# Patient Record
Sex: Female | Born: 1980
Health system: Southern US, Community
[De-identification: ages and names within clinical notes are randomized; demographics above are authoritative.]

## PROBLEM LIST (undated history)

## (undated) DIAGNOSIS — F419 Anxiety disorder, unspecified: Secondary | ICD-10-CM

## (undated) DIAGNOSIS — F32A Depression, unspecified: Secondary | ICD-10-CM

## (undated) DIAGNOSIS — E669 Obesity, unspecified: Secondary | ICD-10-CM

## (undated) DIAGNOSIS — D649 Anemia, unspecified: Secondary | ICD-10-CM

## (undated) DIAGNOSIS — G4733 Obstructive sleep apnea (adult) (pediatric): Secondary | ICD-10-CM

## (undated) DIAGNOSIS — F329 Major depressive disorder, single episode, unspecified: Secondary | ICD-10-CM

## (undated) DIAGNOSIS — K219 Gastro-esophageal reflux disease without esophagitis: Secondary | ICD-10-CM

## (undated) DIAGNOSIS — Q433 Congenital malformations of intestinal fixation: Secondary | ICD-10-CM

## (undated) DIAGNOSIS — R519 Headache, unspecified: Secondary | ICD-10-CM

## (undated) DIAGNOSIS — R51 Headache: Secondary | ICD-10-CM

## (undated) HISTORY — DX: Obstructive sleep apnea (adult) (pediatric): G47.33

## (undated) HISTORY — DX: Major depressive disorder, single episode, unspecified: F32.9

## (undated) HISTORY — DX: Congenital malformations of intestinal fixation: Q43.3

## (undated) HISTORY — DX: Anxiety disorder, unspecified: F41.9

## (undated) HISTORY — DX: Depression, unspecified: F32.A

## (undated) HISTORY — DX: Obesity, unspecified: E66.9

## (undated) HISTORY — PX: TUBAL LIGATION: SHX77

## (undated) HISTORY — PX: SLEEVE GASTROPLASTY: SHX1101

---

## 2002-12-18 HISTORY — PX: BREAST REDUCTION SURGERY: SHX8

## 2003-05-26 HISTORY — PX: NASAL SINUS SURGERY: SHX719

## 2004-03-31 ENCOUNTER — Ambulatory Visit: Payer: Self-pay | Admitting: Otolaryngology

## 2004-05-25 ENCOUNTER — Ambulatory Visit: Payer: Self-pay | Admitting: Otolaryngology

## 2004-06-03 ENCOUNTER — Emergency Department: Payer: Self-pay | Admitting: Otolaryngology

## 2006-04-12 ENCOUNTER — Other Ambulatory Visit: Admission: RE | Admit: 2006-04-12 | Discharge: 2006-04-12 | Payer: Self-pay | Admitting: Obstetrics and Gynecology

## 2006-08-17 ENCOUNTER — Emergency Department: Payer: Self-pay | Admitting: Emergency Medicine

## 2007-01-13 ENCOUNTER — Observation Stay: Payer: Self-pay

## 2007-02-22 ENCOUNTER — Observation Stay: Payer: Self-pay

## 2007-02-28 ENCOUNTER — Ambulatory Visit: Payer: Self-pay | Admitting: Unknown Physician Specialty

## 2007-04-02 ENCOUNTER — Emergency Department: Payer: Self-pay | Admitting: Emergency Medicine

## 2007-04-14 ENCOUNTER — Inpatient Hospital Stay: Payer: Self-pay | Admitting: Unknown Physician Specialty

## 2007-05-04 ENCOUNTER — Emergency Department: Payer: Self-pay | Admitting: Emergency Medicine

## 2011-01-04 ENCOUNTER — Ambulatory Visit (INDEPENDENT_AMBULATORY_CARE_PROVIDER_SITE_OTHER): Payer: Self-pay | Admitting: General Surgery

## 2011-01-05 ENCOUNTER — Encounter (INDEPENDENT_AMBULATORY_CARE_PROVIDER_SITE_OTHER): Payer: Self-pay | Admitting: General Surgery

## 2011-01-05 ENCOUNTER — Ambulatory Visit (INDEPENDENT_AMBULATORY_CARE_PROVIDER_SITE_OTHER): Payer: 59 | Admitting: General Surgery

## 2011-01-05 VITALS — BP 120/82 | HR 84 | Ht 61.5 in | Wt 215.0 lb

## 2011-01-05 DIAGNOSIS — Q433 Congenital malformations of intestinal fixation: Secondary | ICD-10-CM

## 2011-01-05 DIAGNOSIS — K8020 Calculus of gallbladder without cholecystitis without obstruction: Secondary | ICD-10-CM

## 2011-01-05 NOTE — Patient Instructions (Signed)
Cholelithiasis, Gallbladder Disease (Gallstones) Gallstones are a form of gallbladder disease. When there is an infection of the gallbladder it is called cholecystitis. It is usually caused by a build-up of stones (gallstones or cholelithiasis) in your gallbladder. The gallbladder is not an essential organ. This means it is not necessary for life. It is located slightly to the right of center in the belly (abdomen), behind the liver. It stores bile made in the liver. Bile aids in digestion and absorption of fats. Gallbladder disease may result in feeling sick to your stomach (nausea), abdominal pain, and jaundice. In severe cases, emergency surgery may be required. Gallstones are the most common type of gallbladder disease. They begin as small crystals and slowly grow into stones. Gallstone pain occurs when the gallbladder spasms and a gallstone is blocking the duct. Pain can also occur when a stone passes out of the duct. The pain usually begins suddenly. It may persist from several minutes to several hours. Infection can occur. Infection can add to discomfort and severity of an acute attack. The pain may be made worse by breathing deeply or by being jarred. There may be fever and tenderness to the touch. In some cases, when gallstones do not move into the bile duct, people have no pain or symptoms. These are called "silent" gallstones. Women are three times more likely to develop gallstones than men. Women who have had several pregnancies are more likely to have gallbladder disease. Physicians sometimes advise removing diseased gallbladders before future pregnancies. Other factors that increase the risk of gallbladder disease are obesity, diets heavy in fried foods and dairy products, increasing age, prolonged use of medications containing female hormones, and heredity. HOME CARE INSTRUCTIONS  Only take over-the-counter or prescription medicines for pain, discomfort, or fever as directed by your caregiver.     Follow a low fat diet until seen again. (Fat causes the gallbladder to contract.)   Follow-up as instructed. Attacks are almost always recurrent and surgery is usually required for permanent treatment.  SEEK IMMEDIATE MEDICAL CARE IF:  Pain is increasing and is not controlled by medications.   You have an oral temperature above 101.5  not controlled by medication.   You develop nausea and vomiting.  MAKE SURE YOU:   Understand these instructions.   Will watch your condition.   Will get help right away if you are not doing well or get worse.  Document Released: 06/07/2005 Document Re-Released: 05/24/2008 [facility] Patient Information 2011 [place], [place].  Volvulus (Malrotation of the Gut) A malrotation of the gut occurs when something goes wrong during development the small intestine (gut or small bowel). When this occurs, the small intestine is not fixed in the abdomen (belly). The intestines are held by just their blood supply. When the intestines become twisted, because they are not fastened down, it cuts off their blood supply. It is much like a hose getting kinked. This loss of blood supply leads to damage to the gut. This condition is also called volvulus. SYMPTOMS Most often this abnormality shows up during the first month of life. Sometimes it may not produce symptoms until adulthood. Usually the abdomen becomes distended and there is vomiting of bilious material. This is greenish or bile stained vomitus. There is usually cramping and intermittent abdominal pain. DIAGNOSIS The diagnosis can be made with an upper GI (gastrointestinal) x-ray. This is an x-ray study using contrast material. Contrast material is something given to the baby by mouth that shows the inside of the stomach and  small bowel better. This x-ray shows an abnormal position of the first part of the small bowel located where the small bowel first leaves the stomach. TREATMENT A volvulus is a surgical emergency.  This must be corrected immediately to preserve life.   The surgeon will untwist the intestine if necessary.   The surgeon will cut through the abnormal bands holding the organs in the wrong places.   The appendix will be removed. If the child were to have an appendicitis the diagnosis would be dangerously delayed because of the abnormal position.   After the operation, it is extremely unlikely the bowel will rotate again. The body has internal scarring called adhesions that hold things in place.  Document Released: 03/06/2001 Document Re-Released: 09/05/2009 [facility] Patient Information 2011 [place], [place].

## 2011-01-08 ENCOUNTER — Encounter (INDEPENDENT_AMBULATORY_CARE_PROVIDER_SITE_OTHER): Payer: Self-pay | Admitting: General Surgery

## 2011-01-08 DIAGNOSIS — K8020 Calculus of gallbladder without cholecystitis without obstruction: Secondary | ICD-10-CM | POA: Insufficient documentation

## 2011-01-08 DIAGNOSIS — E669 Obesity, unspecified: Secondary | ICD-10-CM | POA: Insufficient documentation

## 2011-01-08 DIAGNOSIS — Q433 Congenital malformations of intestinal fixation: Secondary | ICD-10-CM | POA: Insufficient documentation

## 2011-01-08 NOTE — Progress Notes (Signed)
Subjective:     Patient ID: Amber [name], female   DOB: [birth year], 30 y.o.   MRN: [id number]  Abdominal Pain Associated symptoms include nausea. Pertinent negatives include no diarrhea, fever, headaches or hematuria.  30 year old obese Caucasian female who was referred here for gallstones and abdominal pain. She was on vacation in [place] when she developed severe upper abdominal and right-sided abdominal pain. At first she thought she was having a heart attack. She described it as a sharp pain. Because the pain was persistent, she went to the emergency room at [facility] in [place]. There an ultrasound of her abdomen as well as a CT scan of her abdomen was performed which demonstrated gallstones.  She states that she has probably had several previous episodes but not nearly as intense. She describes these previous episodes as less intense and more of an achy feeling. Particularly right sided and associated with nausea. These episodes can occur after eating spicy or fried foods. She cannot recall any relieving factors. She denies any fevers, chills, diarrhea, or emesis. She denies any dysphasia or clay-colored stools. She denies any jaundice. She will have some occasional constipation. She reports having a bowel movement about every 2 days. She denies any weight loss.  Past Medical History  Diagnosis Date  . Anxiety   . Obesity (BMI 30-39.9)    Past Surgical History  Procedure Date  . Breast reduction surgery 12/18/02  . Nasal sinus surgery 05/26/03  . Cesarean section    Allergies  Allergen Reactions  . Penicillins Hives and Swelling  . Sulfa Antibiotics Hives and Swelling   Current Outpatient Prescriptions  Medication Sig Dispense Refill  . ALPRAZolam (XANAX PO) Take by mouth daily.        Marland Kitchen FLUoxetine HCl (PROZAC PO) Take by mouth daily.        Marland Kitchen HYDROcodone-acetaminophen (VICODIN) 5-500 MG per tablet Take 1 tablet by mouth every 6 (six) hours as needed.         History    Substance Use Topics  . Smoking status: Former Smoker    Quit date: 01/05/2003  . Smokeless tobacco: Not on file  . Alcohol Use: Yes     "occasionally"   Family History  Problem Relation Age of Onset  . Hypertension Mother     Review of Systems  Constitutional: Negative for fever, appetite change and fatigue.  HENT: Negative for congestion.   Respiratory: Negative for chest tightness and shortness of breath.   Cardiovascular: Negative for chest pain and palpitations.  Gastrointestinal: Positive for nausea and abdominal pain. Negative for diarrhea, blood in stool and abdominal distention.  Genitourinary: Negative for urgency, hematuria and difficulty urinating.  Musculoskeletal: Negative for back pain.       Recently fractured right 3rd toe  Neurological: Negative for dizziness, seizures and headaches.  Hematological: Negative.   Psychiatric/Behavioral:       Some anxiety  All other systems reviewed and are negative.       Objective:   Physical Exam  Vitals reviewed. Constitutional: She is oriented to person, place, and time. She appears well-developed and well-nourished. She is cooperative.       obese  HENT:  Head: Normocephalic and atraumatic.  Eyes: Conjunctivae are normal. Pupils are equal, round, and reactive to light. No scleral icterus.  Neck: Normal range of motion. Neck supple. No tracheal deviation present.  Cardiovascular: Normal rate, regular rhythm and normal heart sounds.   Pulmonary/Chest: Effort normal and breath sounds normal. No  respiratory distress. She has no wheezes.  Abdominal: Soft. Bowel sounds are normal. She exhibits no distension. There is no tenderness. There is no guarding. No hernia. Hernia confirmed negative in the ventral area.         Obese, soft  Musculoskeletal: Normal range of motion.  Lymphadenopathy:    She has no cervical adenopathy.  Neurological: She is alert and oriented to person, place, and time.  Skin: Skin is warm and  dry. No erythema.  Psychiatric: She has a normal mood and affect. Her behavior is normal. Thought content normal.   Data reviewed: I reviewed the ultrasound report as well as a CT scan report of her abdomen and pelvis from December 01, 2010. I do not have access to the imaging. Her ultrasound showed a homogeneous liver without focal mass or biliary dilatation. There are small echogenic shadowing foci in the gallbladder neck. There is no wall thickening or pericholecystic fluid. The common bile duct is normal at 3 mm. The pancreas is normal. CT scan of her abdomen and pelvis showed normal liver without biliary dilatation or focal mass. There is a 6-7 mm calcified also in the region of the gallbladder neck. The bowel appears unremarkable without any evidence of bowel wall thickening. However there is malrotation of the bowel. The duodenum does not cross the midline. The superior mesenteric artery situated to the right of the superior mesenteric vein. The cecum is positioned in the left upper quadrant in the ascending colon is in the epigastric region. There is no evidence of vascular compromise. An IUD is within the uterus. The patient's appendix is in the left mid abdomen.    Assessment:     30 year old obese Caucasian female with symptomatic cholelithiasis as well as congenital malrotation of the bowel.    Plan:     With respect to her gallstones, I do believe she is symptomatic. We discussed both nonoperative and operative management. We discussed diet modification. The patient has been avoiding fried foods as well as spicy foods but continues to have intermittent discomfort in the right upper quadrant. Therefore I recommended laparoscopic cholecystectomy.    I discussed the procedure in detail.  The patient was given Agricultural engineer.  We discussed the risks and benefits of a laparoscopic cholecystectomy including, but not limited to bleeding, infection, injury to surrounding structures such as the  intestine or liver, bile leak, retained gallstones, need to convert to an open procedure, prolonged diarrhea, blood clots such as  DVT, common bile duct injury, anesthesia risks, and possible need for additional procedures.  We discussed the typical post-operative recovery course.  With respect to the CT finding of congenital malrotation of the bowel, I described his condition. I believe the majority of her symptoms are due to her gallstones and not due to this malrotation of her bowel. I explained that this rarely causes a problem in an adult.  I also explained if this is a rare finding in an adult. We discussed that this is typically found in infancy. We did discuss the signs and symptoms of volvulus as well as partial bowel obstruction. The patient was given educational material.  We will schedule her for laparoscopic cholecystectomy with an operative cholangiogram.

## 2011-01-18 ENCOUNTER — Encounter (HOSPITAL_COMMUNITY)
Admission: RE | Admit: 2011-01-18 | Discharge: 2011-01-18 | Disposition: A | Discharge status: Home / Self Care | Payer: 59 | Source: Ambulatory Visit | Attending: General Surgery | Admitting: General Surgery

## 2011-01-18 LAB — CBC
Platelets: 249 10*3/uL (ref 150–400)
RBC: 4.9 MIL/uL (ref 3.87–5.11)
RDW: 13.1 % (ref 11.5–15.5)
WBC: 8.9 10*3/uL (ref 4.0–10.5)

## 2011-01-18 LAB — DIFFERENTIAL
Basophils Absolute: 0.1 10*3/uL (ref 0.0–0.1)
Basophils Relative: 1 % (ref 0–1)
Eosinophils Absolute: 0.2 10*3/uL (ref 0.0–0.7)
Eosinophils Relative: 2 % (ref 0–5)
Lymphs Abs: 3 10*3/uL (ref 0.7–4.0)
Neutrophils Relative %: 55 % (ref 43–77)

## 2011-01-18 LAB — COMPREHENSIVE METABOLIC PANEL
ALT: 16 U/L (ref 0–35)
AST: 13 U/L (ref 0–37)
Albumin: 4.3 g/dL (ref 3.5–5.2)
CO2: 26 mEq/L (ref 19–32)
Chloride: 101 mEq/L (ref 96–112)
Creatinine, Ser: 0.84 mg/dL (ref 0.50–1.10)
GFR calc non Af Amer: >60 mL/min (ref >60)
Potassium: 3.8 mEq/L (ref 3.5–5.1)
Sodium: 139 mEq/L (ref 135–145)
Total Bilirubin: 0.5 mg/dL (ref 0.3–1.2)

## 2011-01-18 LAB — SURGICAL PCR SCREEN: Staphylococcus aureus: POSITIVE — AB

## 2011-01-19 ENCOUNTER — Telehealth (INDEPENDENT_AMBULATORY_CARE_PROVIDER_SITE_OTHER): Payer: Self-pay | Admitting: General Surgery

## 2011-01-19 LAB — HCG, SERUM, QUALITATIVE: Preg, Serum: NEGATIVE

## 2011-01-19 NOTE — Telephone Encounter (Signed)
Labs ok for surgery,  faxed to [facility] Short stay.

## 2011-01-19 NOTE — Telephone Encounter (Signed)
Message copied by Liliana Cline on Fri Jan 19, 2011  1:41 PM ------      Message from: Andrey Campanile, ERIC M      Created: Thu Jan 18, 2011  7:35 PM       Labs ok for surgery

## 2011-01-22 ENCOUNTER — Ambulatory Visit (HOSPITAL_COMMUNITY): Payer: 59

## 2011-01-22 ENCOUNTER — Other Ambulatory Visit (INDEPENDENT_AMBULATORY_CARE_PROVIDER_SITE_OTHER): Payer: Self-pay | Admitting: General Surgery

## 2011-01-22 ENCOUNTER — Ambulatory Visit (HOSPITAL_COMMUNITY)
Admission: RE | Admit: 2011-01-22 | Discharge: 2011-01-22 | Disposition: A | Discharge status: Home / Self Care | Payer: 59 | Source: Ambulatory Visit | Attending: General Surgery | Admitting: General Surgery

## 2011-01-22 DIAGNOSIS — Z01810 Encounter for preprocedural cardiovascular examination: Secondary | ICD-10-CM | POA: Insufficient documentation

## 2011-01-22 DIAGNOSIS — Z01812 Encounter for preprocedural laboratory examination: Secondary | ICD-10-CM | POA: Insufficient documentation

## 2011-01-22 DIAGNOSIS — K8010 Calculus of gallbladder with chronic cholecystitis without obstruction: Secondary | ICD-10-CM | POA: Insufficient documentation

## 2011-01-22 HISTORY — PX: LAPAROSCOPIC CHOLECYSTECTOMY W/ CHOLANGIOGRAPHY: SUR757

## 2011-01-26 ENCOUNTER — Encounter (INDEPENDENT_AMBULATORY_CARE_PROVIDER_SITE_OTHER): Payer: Self-pay | Admitting: General Surgery

## 2011-01-26 ENCOUNTER — Encounter (INDEPENDENT_AMBULATORY_CARE_PROVIDER_SITE_OTHER): Payer: Self-pay

## 2011-01-30 NOTE — Op Note (Signed)
Amber [name], Amber [name]                 ACCOUNT NO.:  [id number]  MEDICAL RECORD NO.:  [id number]  LOCATION:  [facility]                         FACILITY:  [facility]  PHYSICIAN:  Mary Sella. Andrey Campanile, MD     DATE OF BIRTH:  09/29/1980  DATE OF PROCEDURE:  01/22/2011 DATE OF DISCHARGE:                              OPERATIVE REPORT   PREOPERATIVE DIAGNOSIS:  Symptomatic cholelithiasis.  POSTOPERATIVE DIAGNOSIS:  Symptomatic cholelithiasis.  PROCEDURE:  Laparoscopic cholecystectomy with intraoperative cholangiogram.  SURGEON:  Mary Sella. Andrey Campanile, MD  ASSISTANT SURGEON:  Adolph Pollack, MD  ANESTHESIA:  General plus 30 mL of 0.25% Marcaine with epi.  FINDINGS:  The cholangiogram demonstrated prompt opacification of the cystic duct, common hepatic, common bile duct, left and right hepatic ducts as well as emptying into the duodenum.  There was no filling defects.  The common bile duct was short.  The critical view was obtained.  There was a little bit of oozing from the gallbladder fossa at the end which was managed with electrocautery.  However, I did elect to leave a piece of surgical SNoW in the gallbladder fossa.  INDICATIONS FOR PROCEDURE:  The patient is a very pleasant obese 30 year old Caucasian female who was in [place] on vacation when she developed severe upper abdominal and right-sided abdominal pain.  It was persistent and sharp, so she went to the emergency room in [place], where an ultrasound as well as CT demonstrated gallstones.  She had a subsequent attack, but since that time she has been asymptomatic.  We discussed the risks and benefits of a laparoscopic cholecystectomy including, but not limited to bleeding, infection, injury to surrounding structures, injury to the common bile duct requiring major reconstructive bile duct surgery, prolonged diarrhea, bile leak, need to convert to an open procedure, DVT occurrence, failure to ameliorate her abdominal pain, wound  complications and hernia formation.  The patient elects to proceed to surgery.  DESCRIPTION OF PROCEDURE:  After obtaining informed consent, the patient was brought to the operating room, placed supine on the operating table. General endotracheal anesthesia was established.  Sequential compression devices were placed.  Her abdomen was prepped and draped in the usual standard surgical fashion.  ChloraPrep was used.  A surgical time-out was performed.  She received ciprofloxacin prior to skin incision. Because she had a long upper torso, I elected to infiltrate local above her belly button.  I then made a 1-1/2-cm skin incision with #11 blade. The fascia was grasped and lifted anteriorly.  Next, the fascia was incised with #11 blade and the abdominal cavity was entered just above the umbilicus.  A pursestring suture was placed around the fascial edges using a 0 Vicryl on a UR-6 needle.  The Hasson trocar was placed and pneumoperitoneum was smoothly established up to a patient pressure of 15 mmHg.  The laparoscope was advanced.  There was no evidence of injury to surrounding structures.  She was then placed in reverse Trendelenburg and rotated slightly to the left.  I placed 3 additional 5-mm trocars, one in the subxiphoid and 2 in the right hypochondrium all under direct visualization after local been infiltrated.  She did have omentum  which was adhered to the body of the gallbladder.  This was gently stripped out the way and taken down with hook electrocautery.  The gallbladder was then grasped and lifted toward the right shoulder.  The neck was grasped and retracted laterally.  I then incised the peritoneum both medially and laterally with hook electrocautery.  The cystic duct and cystic artery were each circumferentially dissected around with aid of a Teaching laboratory technician.  There were no other structures entering the gallbladder.  One clip was placed on the gallbladder side of the  cystic duct.  It was then partially transected with Endoshears.  I then introduced the [facility] cholangiogram catheter percutaneously through the abdominal wall under direct visualization and threaded it into the cystic duct and secured it with a clip.  The cholangiogram was performed with the results as described above.  Pneumoperitoneum was reestablished and she was put back in the operating position.  The clip securing the cholangiogram catheter was removed as well as the cholangiogram catheter from the abdominal cavity.  Three clips were placed on the biliary side of the cystic duct, it was then transect with Endoshears.  I then placed two clips proximally in the cystic artery and one distal next to the gallbladder.  It was then transected with Endoshears.  We then rolled the gallbladder up at the gallbladder fossa using hook electrocautery. There was a little bit of bleeding from the gallbladder fossa, however, this was managed with electrocautery.  The gallbladder was freed. Laparoscope was placed in the subxiphoid trocar.  The specimen bag was advanced at the umbilical Hasson trocar and the gallbladder was placed in the bag and removed from the abdominal cavity.  Pneumoperitoneum was reestablished by replacement of the Hasson trocar.  The gallbladder fossa was reinspected.  There was a little bit of bleeding from one area, hook electrocautery was used.  I then irrigated the right upper quadrant with several 100 mL of saline.  There was no evidence of bile leak.  There was one little persistent area.  I turned up the Bovie to 60 and used electrocautery.  Hemostasis was ensured.  But, because this area was the same place that it ooze a little bit on two separate occasions, I did elect to place a piece of surgical SNoW through the umbilical trocar and up into the gallbladder fossa.  I then removed this on trocar and tied down the previously placed pursestring suture.  There was no air  leak at the umbilicus.  There was nothing within our fascial closure.  The fascial defect had been well approximated.  I then infiltrated local in the preperitoneal space at the umbilicus.  The remaining trocars were removed and pneumoperitoneum was released.  All skin incisions were closed with 4-0 Monocryl in subcuticular fashion followed by application of Dermabond.  All needle, instrument, sponge counts were correct x2.  There were no immediate complications.  The patient was extubated and taken to recovery room in stable condition.     Mary Sella. Andrey Campanile, MD     EMW/MEDQ  D:  01/22/2011  T:  01/22/2011  Job:  [id number]  cc:   Dennison Mascot, MD  Electronically Signed by Gaynelle Adu M.D. on 01/30/2011 10:38:02 AM

## 2011-02-15 ENCOUNTER — Ambulatory Visit (INDEPENDENT_AMBULATORY_CARE_PROVIDER_SITE_OTHER): Payer: 59 | Admitting: General Surgery

## 2011-02-15 ENCOUNTER — Encounter (INDEPENDENT_AMBULATORY_CARE_PROVIDER_SITE_OTHER): Payer: Self-pay | Admitting: General Surgery

## 2011-02-15 VITALS — BP 110/78 | HR 62

## 2011-02-15 DIAGNOSIS — Z09 Encounter for follow-up examination after completed treatment for conditions other than malignant neoplasm: Secondary | ICD-10-CM

## 2011-02-15 NOTE — Patient Instructions (Signed)
Your pathology showed chronic cholecystitis & gallstones.

## 2011-02-15 NOTE — Progress Notes (Signed)
Chief complaint: Postop  Procedure: Status post laparoscopic cholecystectomy with interoperative cholangiogram on January 22, 2011  History of Present Ilness: 30 year old obese Caucasian female comes in today for her postoperative appointment. She has no complaints today. She denies any fevers, chills, nausea, or vomiting. She reports daily bowel movements. They are occasionally loose if she eats spicy foods. She denies any problems with her incisions. She is no longer taking any narcotics. She denies any abdominal pain.  Physical Exam: BP 110/78  Pulse 62  Well-developed well-nourished obese Caucasian female in no apparent distress Pulmonary-lungs are clear to auscultation Cardiac-regular rate and rhythm Abdomen-soft, nontender, nondistended. Well-healed trocar incisions. No signs of incisional hernia. No cellulitis.  Pathology: Gallbladder showed chronic cholecystitis and cholelithiasis  Assessment and Plan: Status post laparoscopic cholecystectomy with interoperative cholangiogram for chronic cholecystitis and cholelithiasis-doing well  She seems to be doing quite well. I've release her to full activities. I advised her that her bowel habits should normalize over time.  We discussed her pathology report.  I'll see her on an as-needed basis.

## 2012-03-31 ENCOUNTER — Ambulatory Visit: Payer: Self-pay | Admitting: Bariatrics

## 2012-03-31 DIAGNOSIS — Z01810 Encounter for preprocedural cardiovascular examination: Secondary | ICD-10-CM

## 2012-05-27 ENCOUNTER — Ambulatory Visit: Payer: Self-pay | Admitting: Bariatrics

## 2012-06-03 ENCOUNTER — Inpatient Hospital Stay: Payer: Self-pay | Admitting: Bariatrics

## 2012-06-04 LAB — CBC WITH DIFFERENTIAL/PLATELET
Basophil %: 0.3 %
Eosinophil #: 0 10*3/uL (ref 0.0–0.7)
HCT: 39.9 % (ref 35.0–47.0)
HGB: 13.7 g/dL (ref 12.0–16.0)
Lymphocyte %: 7.3 %
MCHC: 34.4 g/dL (ref 32.0–36.0)
Monocyte %: 3.8 %
Neutrophil #: 7.2 10*3/uL — ABNORMAL HIGH (ref 1.4–6.5)

## 2012-06-04 LAB — CREATININE, SERUM
Creatinine: 0.71 mg/dL (ref 0.60–1.30)
EGFR (African American): >60
EGFR (Non-African Amer.): >60

## 2012-06-23 ENCOUNTER — Ambulatory Visit: Payer: Self-pay | Admitting: Bariatrics

## 2012-06-25 ENCOUNTER — Ambulatory Visit: Payer: Self-pay | Admitting: Bariatrics

## 2012-08-13 ENCOUNTER — Ambulatory Visit: Payer: Self-pay | Admitting: Bariatrics

## 2013-03-17 ENCOUNTER — Ambulatory Visit: Payer: Self-pay | Admitting: Family Medicine

## 2013-09-06 IMAGING — RF DG UGI W/O KUB
6 series · 7 of 7 positions shown · non-contrast
Comparison: none

REASON FOR EXAM: snoring depression anxiety morbid obesity
COMMENTS:

PROCEDURE:     [place]  - [place] UPPER GI  - March 31, 2012  [DATE]
RESULT:     Esophagus and stomach are normal. Duodenal bulb and C-loop are
normal. No reflux.

[Series 1: fluoro_barium 2fps_bw · 0.18mm/px · 1 of 1 slices shown (1 of 6)]
[im 1/1]
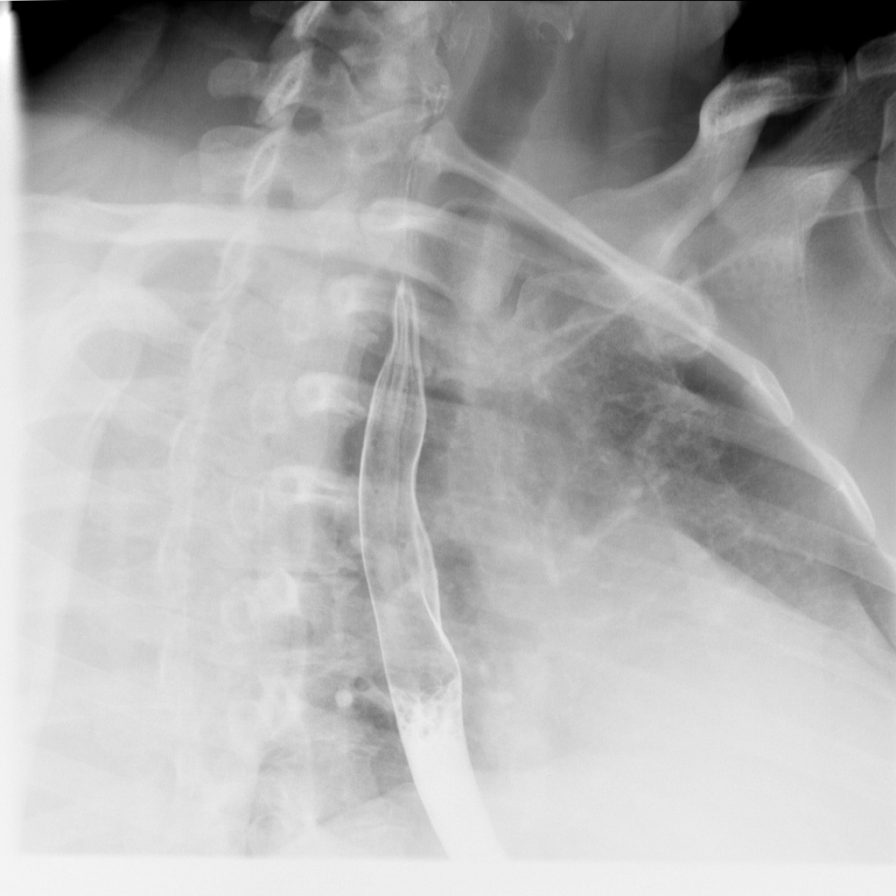

[Series 2: fluoro_barium 2fps_bw · 0.18mm/px · 1 of 1 slices shown (2 of 6)]
[im 1/1]
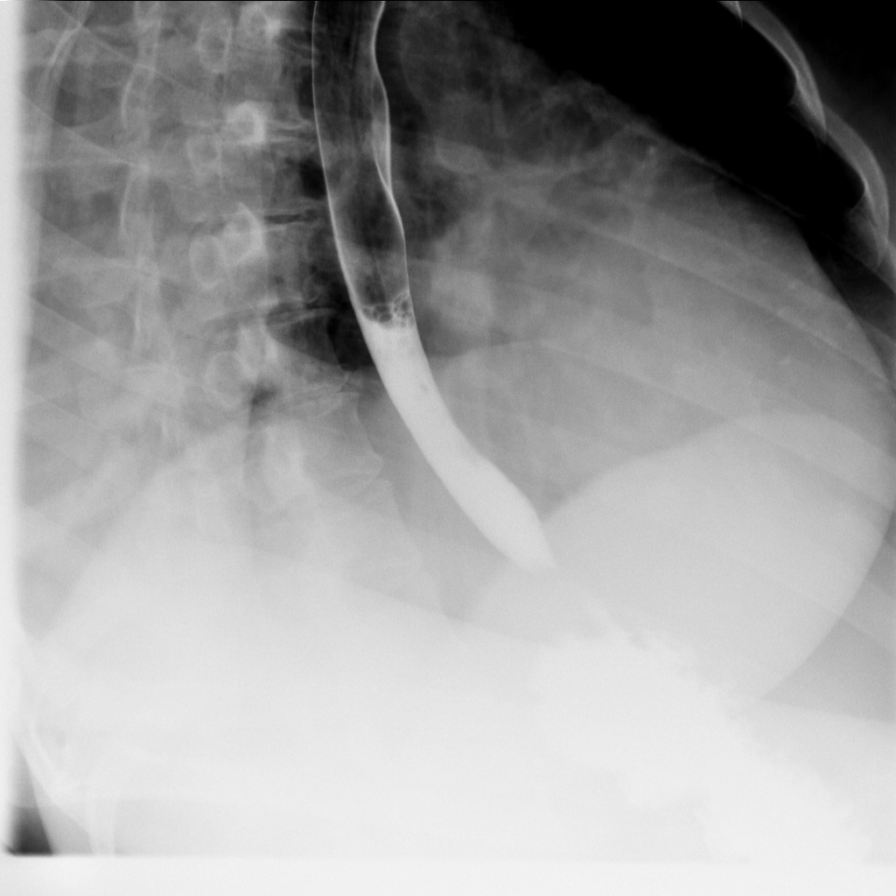

[Series 3: fluoro_barium 2fps_bw · 0.20mm/px · 2 of 2 frames shown (3 of 6)]
[frame 1/2]
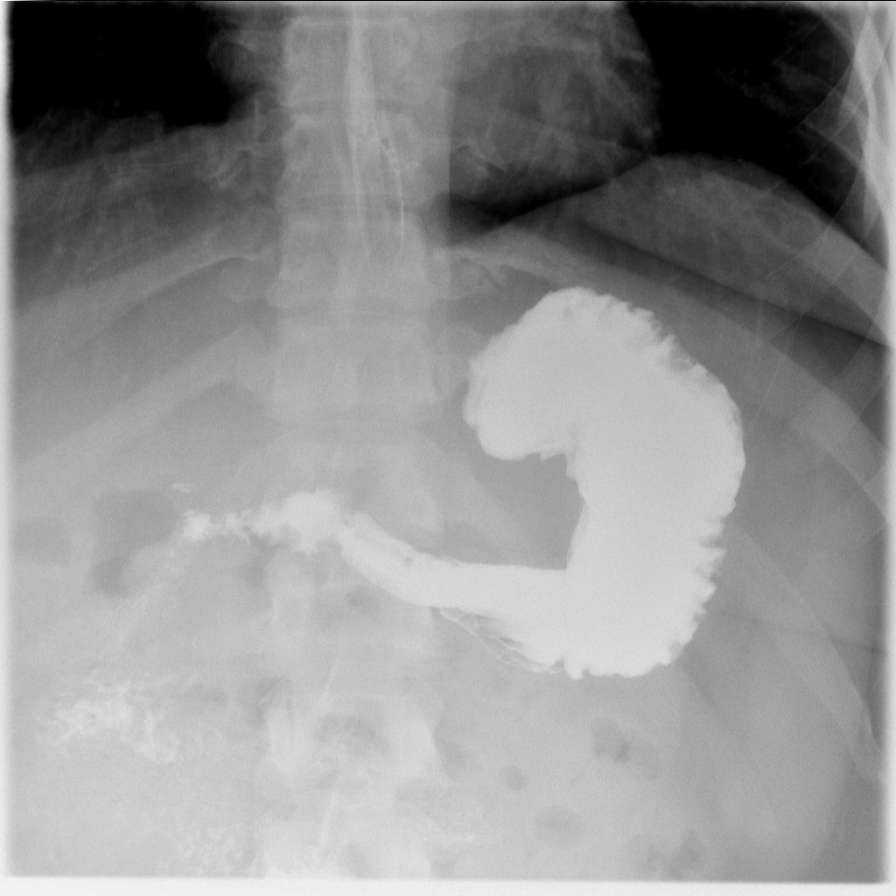
[frame 2/2]
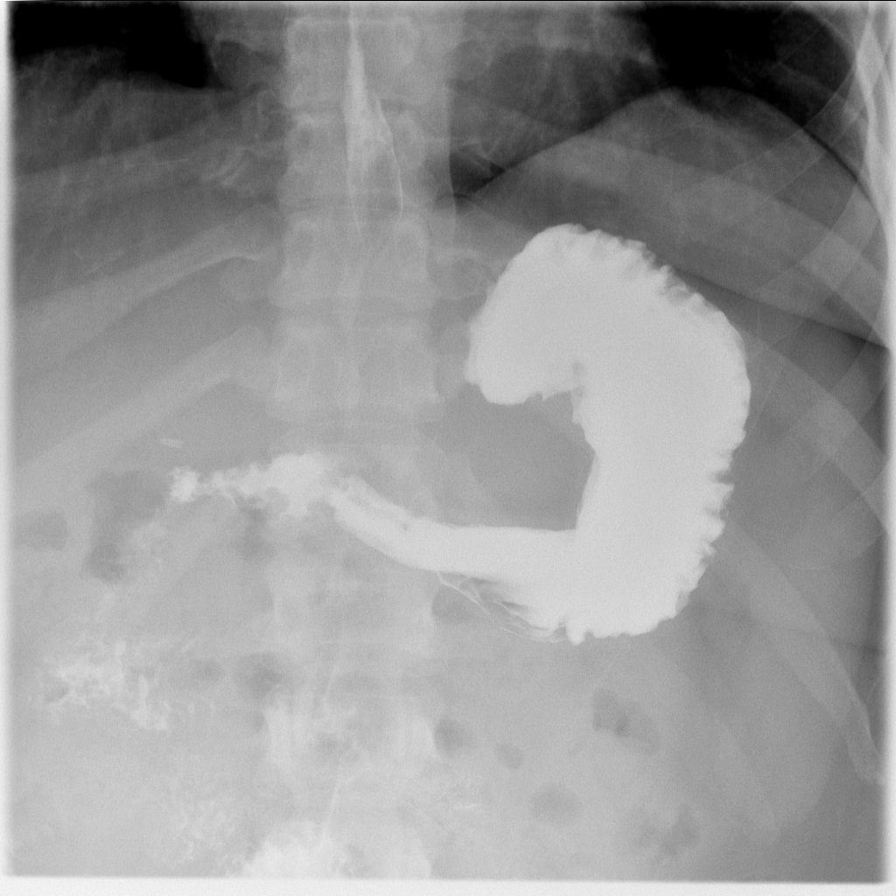

[Series 4: fluoro_barium 2fps_bw · 0.20mm/px · 1 of 1 slices shown (4 of 6)]
[im 1/1]
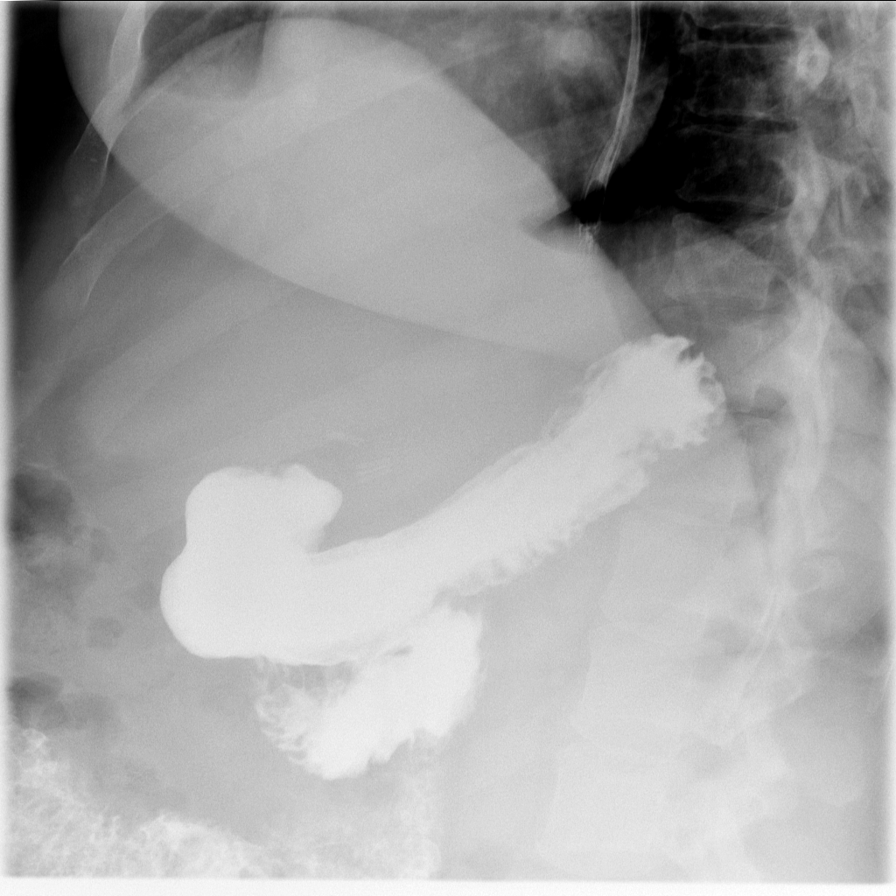

[Series 5: fluoro_barium 2fps_bw · 0.20mm/px · 1 of 1 slices shown (5 of 6)]
[im 1/1]
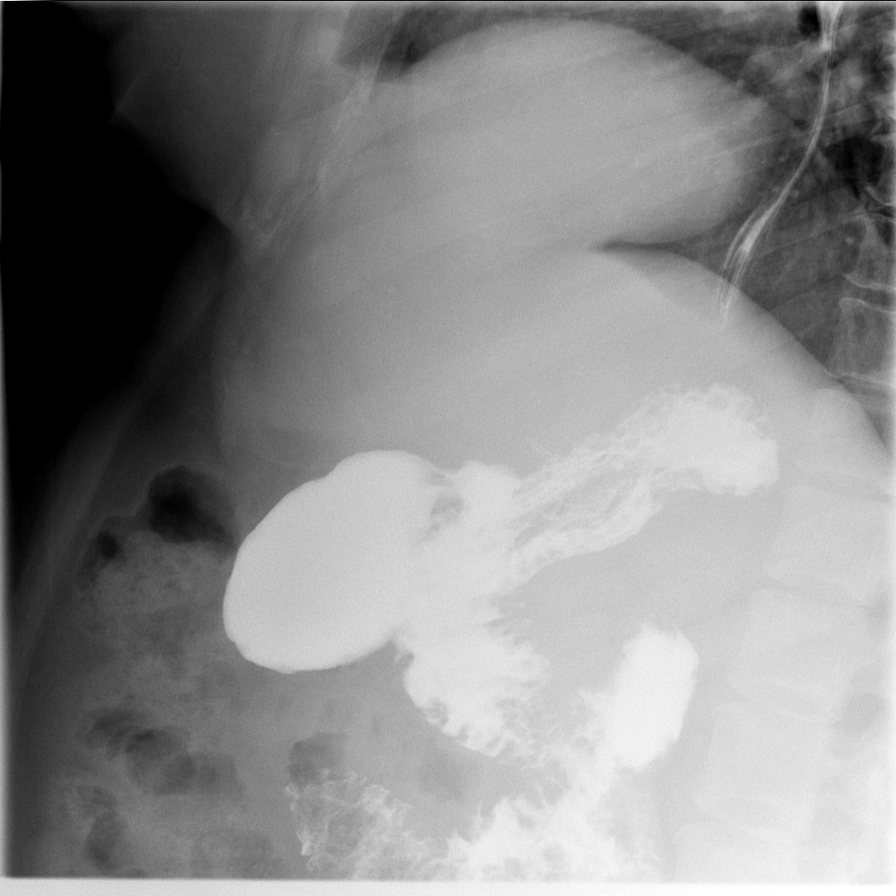

[Series 6: fluoro_barium 2fps_bw · 0.21mm/px · 1 of 1 slices shown (6 of 6)]
[im 1/1]
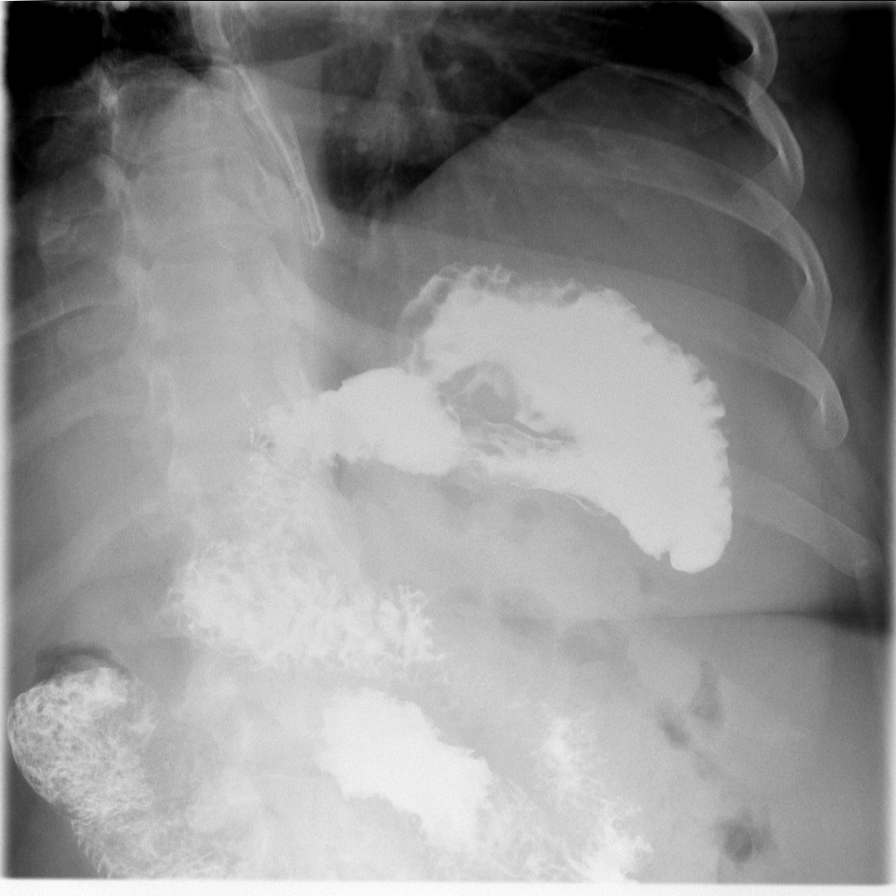

[7 of 7 positions shown; findings below may reference images not displayed]

IMPRESSION: Normal exam.

## 2014-10-12 NOTE — Op Note (Signed)
PATIENT NAMDonia [name]:  [name], Amber [name] MR#:  [id number] DATE OF BIRTH:  1981-02-04  DATE OF PROCEDURE:  06/03/2012  PREOPERATIVE DIAGNOSIS: Morbid obesity with a BMI of 41 associated with obstructive sleep apnea.   POSTOPERATIVE DIAGNOSIS: Morbid obesity with a BMI of 41 associated with obstructive sleep apnea.   PROCEDURE: Laparoscopic sleeve gastrectomy.  SURGEON: Tyrone AppleMichael A. Tyner, MD  DESCRIPTION OF PROCEDURE: The patient was brought to the Operating Room and placed in the supine position and general anesthesia obtained with orotracheal intubation. Foley catheter deferred. The patient had TED hose and Thromboguards applied and a foot board applied at the end of the operative bed. The patient then had a prep and drape of the lower chest and abdomen. A 5 mm Optiview trocar was introduced under direct visualization, in the left upper quadrant, of the abdomen. Three additional trocars were then introduced across the upper abdomen and a Nathanson liver retractor introduced through a subxiphoid wound. The left lobe of the liver was elevated. On gross inspection, there was no evidence of a hiatal hernia, no widening of the hiatal peritoneum noted. A moderate size fat pad was excised from the region of the angle of His to confirm the absence of a hiatal hernia. The patient also had division of diaphragmatic attachments to the upper fundus. The patient then underwent division of the arcade vessels associated with the greater curvature of the stomach beginning at 0.4 cm proximal to the pylorus. There was full division of the gastrocolic and gastrosplenic ligaments. The posterior aspect of the stomach was freed from the underlying pancreas by division of peritoneal attachments. The patient then had a 34 French bougie passed transorally and was directed to the level of the antrum. Next, there was creation of a medially based gastric tube with the aid of a series of GI staples. The first firing was a green load stapler placed  in relative transverse direction in an effort to avoid any narrowing at the region of the incisura. Next, a vertical line of staples was performed using gold load staples. These were placed parallel to the lesser curvature of the stomach and brought out just lateral to the angle of His. A small dog ear of stomach was left immediately adjacent to junction of the esophagus and upper stomach. The staple lines were reinforced with seam guard and the staple line were lying comfortably between firings. As this was done, the 34 French bougie was gradually withdrawn to avoid any compression against the staple line itself. At this point, hemostasis was confirmed. The prior divided gastrosplenic and gastrocolic ligament was then secured to the lateral aspect of the gastric tube in an effort to avoid any over rotation of the residual stomach. The lateral stomach was retrieved by way of the right upper quadrant 15 mm trocar site. The fascia and peritoneum of this wound was closed with 0 Vicryl suture passed by way of a Gore suture passer under direct visualization. After confirming hemostasis, the pneumoperitoneum was relieved, the trocars were removed, and the patient's wounds were closed with 4-0 Monocryl to the dermis followed by Dermabond. The wounds were injected with 0.25% Marcaine prior to application of the sutures. The patient was allowed to recover from anesthesia having tolerated the procedure well.  ____________________________ Tyrone AppleMichael A. Alva Garnetyner, MD mat:slb D: 06/05/2012 15:06:00 ET (Entered as incorrect work type - 02) T: 06/05/2012 16:42:36 ET JOB#: [id number]  cc: Casimiro NeedleMichael A. Alva Garnetyner, MD, <Dictator> [facility] [place] RankMICHAEL A TYNER MD ELECTRONICALLY SIGNED 06/05/2012 19:08

## 2015-01-21 LAB — OB RESULTS CONSOLE VARICELLA ZOSTER ANTIBODY, IGG: VARICELLA IGG: NON-IMMUNE/NOT IMMUNE

## 2015-01-21 LAB — OB RESULTS CONSOLE HIV ANTIBODY (ROUTINE TESTING): HIV: NONREACTIVE

## 2015-01-21 LAB — OB RESULTS CONSOLE RUBELLA ANTIBODY, IGM: RUBELLA: IMMUNE

## 2015-01-21 LAB — OB RESULTS CONSOLE HEPATITIS B SURFACE ANTIGEN: Hepatitis B Surface Ag: NEGATIVE

## 2015-01-21 LAB — OB RESULTS CONSOLE RPR: RPR: NONREACTIVE

## 2015-02-02 ENCOUNTER — Encounter: Payer: Self-pay | Admitting: Family Medicine

## 2015-02-02 ENCOUNTER — Ambulatory Visit (INDEPENDENT_AMBULATORY_CARE_PROVIDER_SITE_OTHER): Payer: 59 | Admitting: Family Medicine

## 2015-02-02 VITALS — BP 128/78 | HR 92 | Temp 97.8°F | Resp 16 | Ht 63.0 in | Wt 219.5 lb

## 2015-02-02 DIAGNOSIS — F32A Depression, unspecified: Secondary | ICD-10-CM

## 2015-02-02 DIAGNOSIS — Z87891 Personal history of nicotine dependence: Secondary | ICD-10-CM | POA: Insufficient documentation

## 2015-02-02 DIAGNOSIS — E669 Obesity, unspecified: Secondary | ICD-10-CM

## 2015-02-02 DIAGNOSIS — F329 Major depressive disorder, single episode, unspecified: Secondary | ICD-10-CM

## 2015-02-02 NOTE — Progress Notes (Signed)
Name: Amber [name]   MRN: [id number]    DOB: 1981-06-23   Date:02/02/2015       Progress Note  Subjective  Chief Complaint  Chief Complaint  Patient presents with  . Obesity    pt here to have BMI papaerwork filled out    HPI  Obesity.  Patient has a long-standing history of obesity and she is currently pregnant. She has received a gym and nutritional discount from her OB/GYN during this pregnancy. There is no history of hypertension or hyperlipidemia cardiovascular disease.  History of depression subjective patient has a history of depression and anxiety but is currently off of all meds while pregnant and is doing well.  Past Medical History  Diagnosis Date  . Anxiety   . Obesity (BMI 30-39.9)     Social History  Substance Use Topics  . Smoking status: Former Smoker    Quit date: 01/05/2003  . Smokeless tobacco: Not on file  . Alcohol Use: Yes     Comment: "occasionally"     Current outpatient prescriptions:  .  Prenatal Vit-Fe Fumarate-FA (MULTIVITAMIN-PRENATAL) 27-0.8 MG TABS tablet, Take 1 tablet by mouth daily at 12 noon., Disp: , Rfl:  .  ALPRAZolam (XANAX PO), Take by mouth daily.  , Disp: , Rfl:  .  FLUoxetine HCl (PROZAC PO), Take by mouth daily.  , Disp: , Rfl:  .  HYDROcodone-acetaminophen (VICODIN) 5-500 MG per tablet, Take 1 tablet by mouth every 6 (six) hours as needed.  , Disp: , Rfl:   Allergies  Allergen Reactions  . Penicillins Hives and Swelling  . Sulfa Antibiotics Hives and Swelling    Review of Systems  Constitutional: Negative for fever, chills and weight loss.  HENT: Negative for congestion, hearing loss, sore throat and tinnitus.   Eyes: Negative for blurred vision, double vision and redness.  Respiratory: Negative for cough, hemoptysis and shortness of breath.   Cardiovascular: Negative for chest pain, palpitations, orthopnea, claudication and leg swelling.  Gastrointestinal: Negative for heartburn, nausea, vomiting, diarrhea,  constipation and blood in stool.  Genitourinary: Negative for dysuria, urgency, frequency and hematuria.  Musculoskeletal: Negative for myalgias, back pain, joint pain, falls and neck pain.  Skin: Negative for itching.  Neurological: Negative for dizziness, tingling, tremors, focal weakness, seizures, loss of consciousness, weakness and headaches.  Endo/Heme/Allergies: Does not bruise/bleed easily.  Psychiatric/Behavioral: Negative for depression and substance abuse. The patient is not nervous/anxious and does not have insomnia.      Objective  Filed Vitals:   02/02/15 0809  BP: 128/78  Pulse: 92  Temp: 97.8 F (36.6 C)  Resp: 16  Height:  (1.6 m)  Weight: 219 lb 8 oz (99.565 kg)  SpO2: 98%     Physical Exam  Constitutional: She is oriented to person, place, and time.  Obese  HENT:  Head: Normocephalic.  Eyes: Pupils are equal, round, and reactive to light.  Neck: Normal range of motion. Neck supple. No thyromegaly present.  Cardiovascular: Normal rate and regular rhythm.   Pulmonary/Chest: Effort normal and breath sounds normal.  Neurological: She is alert and oriented to person, place, and time.  Psychiatric: Affect normal.      Assessment & Plan   1. Obesity (BMI 30-39.9) Patient's following reduction program through membership medical exercise facility where she has no exercise and nutritional instruction - Prenatal Vit-Fe Fumarate-FA (MULTIVITAMIN-PRENATAL) 27-0.8 MG TABS tablet; Take 1 tablet by mouth daily at 12 noon. - LP+Creat+Hb A1c  2. Tobacco quit date established  -  Nicotine/cotinine metabolites  3. Depression Currently stable off of all medications

## 2015-02-02 NOTE — Patient Instructions (Signed)

## 2015-02-03 LAB — LP+CREAT+HB A1C
CHOL/HDL RATIO: 2.9 ratio (ref 0.0–4.4)
CHOLESTEROL TOTAL: 177 mg/dL (ref 100–199)
CREATININE: 0.66 mg/dL (ref 0.57–1.00)
GFR, EST AFRICAN AMERICAN: 131 mL/min/{1.73_m2} (ref >59)
GFR, EST NON AFRICAN AMERICAN: 114 mL/min/{1.73_m2} (ref >59)
HDL: 61 mg/dL (ref >39)
Hgb A1c MFr Bld: 5.5 % (ref 4.8–5.6)
LDL Calculated: 81 mg/dL (ref 0–99)
TRIGLYCERIDES: 175 mg/dL — AB (ref 0–149)
VLDL Cholesterol Cal: 35 mg/dL (ref 5–40)

## 2015-02-04 LAB — NICOTINE/COTININE METABOLITES
Cotinine: NOT DETECTED ng/mL
Nicotine: NOT DETECTED ng/mL

## 2015-02-14 ENCOUNTER — Telehealth: Payer: Self-pay | Admitting: Family Medicine

## 2015-02-14 NOTE — Telephone Encounter (Signed)
Re-faxed paperwork along with copy of results

## 2015-02-14 NOTE — Telephone Encounter (Signed)
Pt states she was in a few weeks ago for the BMI screening and they need the lab results to be sent along paperwork. They did not get the lab results.

## 2015-03-16 ENCOUNTER — Encounter: Payer: Self-pay | Admitting: Family Medicine

## 2015-03-16 ENCOUNTER — Ambulatory Visit (INDEPENDENT_AMBULATORY_CARE_PROVIDER_SITE_OTHER): Payer: 59 | Admitting: Family Medicine

## 2015-03-16 VITALS — BP 122/68 | HR 86 | Temp 98.3°F | Resp 16 | Ht 63.0 in | Wt 224.1 lb

## 2015-03-16 DIAGNOSIS — E669 Obesity, unspecified: Secondary | ICD-10-CM | POA: Diagnosis not present

## 2015-03-16 NOTE — Progress Notes (Signed)
Name: Amber [name]   MRN: [id number]    DOB: 1981-01-30   Date:03/16/2015       Progress Note  Subjective  Chief Complaint  Chief Complaint  Patient presents with  . Weight Check    pt here for BMI screening paperwork    HPI  Obesity  Patient has a history of obesity for many years.  Attempts at weight loss have included diet and exercise and this is not been affective .  Results of this regimen  have been weight gain of 4 pounds over the last 2 months .  Patient now voices and interest in weight loss by  mild .  Documentation of BMI and weight efforts are required for work at this time.  Past Medical History  Diagnosis Date  . Anxiety   . Obesity (BMI 30-39.9)     Social History  Substance Use Topics  . Smoking status: Former Smoker    Quit date: 01/05/2003  . Smokeless tobacco: Not on file  . Alcohol Use: Yes     Comment: "occasionally"     Current outpatient prescriptions:  .  Prenatal Vit-Fe Fumarate-FA (MULTIVITAMIN-PRENATAL) 27-0.8 MG TABS tablet, Take 1 tablet by mouth daily at 12 noon., Disp: , Rfl:   Allergies  Allergen Reactions  . Penicillins Hives and Swelling  . Sulfa Antibiotics Hives and Swelling    Review of Systems  Constitutional: Negative for fever, chills and weight loss.       Obesity  HENT: Negative for congestion, hearing loss, sore throat and tinnitus.   Eyes: Negative for blurred vision, double vision and redness.  Respiratory: Negative for cough, hemoptysis and shortness of breath.   Cardiovascular: Negative for chest pain, palpitations, orthopnea, claudication and leg swelling.  Gastrointestinal: Negative for heartburn, nausea, vomiting, diarrhea, constipation and blood in stool.  Genitourinary: Negative for dysuria, urgency, frequency and hematuria.  Musculoskeletal: Negative for myalgias, back pain, joint pain, falls and neck pain.  Skin: Negative for itching.  Neurological: Negative for dizziness, tingling, tremors, focal weakness,  seizures, loss of consciousness, weakness and headaches.  Endo/Heme/Allergies: Does not bruise/bleed easily.  Psychiatric/Behavioral: Negative for depression and substance abuse. The patient is not nervous/anxious and does not have insomnia.      Objective  Filed Vitals:   03/16/15 0856  BP: 122/68  Pulse: 86  Temp: 98.3 F (36.8 C)  Resp: 16  Height:  (1.6 m)  Weight: 224 lb 1 oz (101.634 kg)  SpO2: 97%     Physical Exam  Constitutional: She is oriented to person, place, and time and well-developed, well-nourished, and in no distress.  Obesity no acute distress  HENT:  Head: Normocephalic.  Eyes: EOM are normal. Pupils are equal, round, and reactive to light.  Neck: Normal range of motion. No thyromegaly present.  Cardiovascular: Normal rate, regular rhythm and normal heart sounds.   No murmur heard. Pulmonary/Chest: Effort normal and breath sounds normal.  Musculoskeletal: Normal range of motion.  Neurological: She is alert and oriented to person, place, and time. No cranial nerve deficit. Gait normal.  Skin: Skin is warm and dry. No rash noted.  Psychiatric: Memory and affect normal.      Assessment & Plan 1. Obesity Continue prenatal vitamins pregnancy diet and exercise protocol per her OB/GYN  2. Pregnancy Her OB/GYN

## 2015-06-01 ENCOUNTER — Encounter: Payer: Self-pay | Admitting: Dietician

## 2015-06-01 ENCOUNTER — Encounter: Payer: 59 | Attending: Obstetrics & Gynecology | Admitting: Dietician

## 2015-06-01 VITALS — BP 110/60 | Ht 62.0 in | Wt 231.2 lb

## 2015-06-01 DIAGNOSIS — O24410 Gestational diabetes mellitus in pregnancy, diet controlled: Secondary | ICD-10-CM

## 2015-06-01 DIAGNOSIS — O24419 Gestational diabetes mellitus in pregnancy, unspecified control: Secondary | ICD-10-CM | POA: Insufficient documentation

## 2015-06-01 NOTE — Progress Notes (Signed)
Diabetes Self-Management Education  Visit Type: First/Initial  Appt. Start Time: 1420 Appt. End Time: 1550  06/01/2015  Amber [name], identified by name and date of birth, is a 34 y.o. female with a diagnosis of Diabetes: Gestational Diabetes.   ASSESSMENT  Blood pressure 110/60, height  (1.575 m), weight 231 lb 3.2 oz (104.872 kg), last menstrual period 10/30/2014. Body mass index is 42.28 kg/(m^2). Lacks knowledge of diabetes cares Does not have a BG meter to test BG's  c/o occasional indigestion     Diabetes Self-Management Education - 06/01/15 1553    Visit Information   Visit Type First/Initial   Initial Visit   Diabetes Type Gestational Diabetes   Health Coping   How would you rate your overall health? Good   Psychosocial Assessment   Patient Belief/Attitude about Diabetes Motivated to manage diabetes   Self-care barriers None   Patient Concerns Glycemic Control;Weight Control   Special Needs None   Preferred Learning Style Visual   Learning Readiness Ready   What is the last grade level you completed in school? 12+ some college   Complications   How often do you check your blood sugar? 0 times/day (not testing)   Have you had a dilated eye exam in the past 12 months? No   Have you had a dental exam in the past 12 months? Yes   Are you checking your feet? No   Dietary Intake   Breakfast --  eats out frequently   Snack (morning) --  eats snack at 10a-snack foods, sweets/desserts   Snack (afternoon) --  does not eat an afternoon snack   Snack (evening) --  does not eat a bedtime snack   Beverage(s) --  drinks fruit juice x1/day; drinks occasional sweet tea x1/day; drinks  unsweetened coffee occasionally   Exercise   Exercise Type --  no regular exercise   Patient Education   Previous Diabetes Education No   Disease state  --  discussed GDM and options for treatment   Nutrition management  Role of diet in the treatment of diabetes and the  relationship between the three main macronutrients and blood glucose level;Food label reading, portion sizes and measuring food.;Carbohydrate counting   Physical activity and exercise  --  role of exercise on diabetes management   Medications --  discussed medication options for GDM if needed   Monitoring Taught/evaluated SMBG meter.;Purpose and frequency of SMBG.;Taught/discussed recording of test results and interpretation of SMBG.  gave pt Ultra One Touch Mini meter and instructed on its use-BG 77 (3 hr pp)   Acute complications Discussed and identified patients' treatment of hyperglycemia.   Psychosocial adjustment Role of stress on diabetes   Preconception care Pregnancy and GDM  Role of pre-pregnancy blood glucose control on the development of the fetus;Role of family planning for patients with diabetes;Reviewed with patient blood glucose goals with pregnancy   Personal strategies to promote health Lifestyle issues that need to be addressed for better diabetes care;Helped patient develop diabetes management plan for (enter comment)      Individualized Plan for Diabetes Self-Management Training:   Learning Objective:  Patient will have a greater understanding of diabetes self-management. Patient education plan is to attend individual and/or group sessions per assessed needs and concerns.     Patient Instructions  Read booklet on Gestational Diabetes Follow Gestational Meal Planning Guidelines Complete a 3 Day Food Record and bring to next appointment Check blood sugars 4 x day - before breakfast and 2  hrs after every meal and record  Call MD for prescription for meter strips and lancets Strips   Ultra One Touch test strips  Lancets  One Touch Delica Lancets Bring blood sugar log to next appointment Walk 20-30 minutes at least 5 x week if permitted by MD Next appointment    06-13-15    Education material provided: General Meal Planning Guidelines, Ultra One Touch Mini  meter  If problems or questions, patient to contact team via: [phone]  Future DSME appointment:  06-13-15 with dietitian

## 2015-06-01 NOTE — Patient Instructions (Signed)
Read booklet on Gestational Diabetes Follow Gestational Meal Planning Guidelines Complete a 3 Day Food Record and bring to next appointment Check blood sugars 4 x day - before breakfast and 2 hrs after every meal and record  Call MD for prescription for meter strips and lancets Strips   Ultra One Touch test strips  Lancets  One Touch Delica Lancets Bring blood sugar log to next appointment Walk 20-30 minutes at least 5 x week if permitted by MD Next appointment    06-13-15

## 2015-06-14 ENCOUNTER — Ambulatory Visit: Payer: 59 | Admitting: Dietician

## 2015-06-16 ENCOUNTER — Encounter: Payer: 59 | Admitting: Dietician

## 2015-06-16 VITALS — BP 106/56 | Ht 62.0 in | Wt 227.2 lb

## 2015-06-16 DIAGNOSIS — O24419 Gestational diabetes mellitus in pregnancy, unspecified control: Secondary | ICD-10-CM | POA: Diagnosis not present

## 2015-06-16 DIAGNOSIS — O24410 Gestational diabetes mellitus in pregnancy, diet controlled: Secondary | ICD-10-CM

## 2015-06-16 NOTE — Patient Instructions (Signed)
   If you have a sweet food, count it as part of your carb allowance for your meal. Or have for a snack, 2 or more hours after eating a meal.  Continue to include some light physical activity during the day.

## 2015-06-16 NOTE — Progress Notes (Signed)
   Patient's BG record indicates FBGs within goal range; some elevated reading after meals due to higher carb intake.  Patient's food diary indicates some high carb meals, mostly when eating sweets or sweetened beverages.    Provided 1700kcal meal plan, and wrote individualized menus based on patient's food preferences.  Instructed patient on food safety, including avoidance of Listeriosis, and limiting mercury from fish.  Discussed importance of maintaining healthy lifestyle habits to reduce risk of Type 2 DM as well as Gestational DM with any future pregnancies.  Advised patient to use any remaining testing supplies to test some BGs after delivery, and to have BG tested ideally annually, as well as prior to attempting future pregnancies.

## 2015-07-11 LAB — OB RESULTS CONSOLE GBS: GBS: NEGATIVE

## 2015-08-03 ENCOUNTER — Encounter
Admission: RE | Admit: 2015-08-03 | Discharge: 2015-08-03 | Disposition: A | Discharge status: Home / Self Care | Payer: 59 | Source: Ambulatory Visit | Attending: Obstetrics & Gynecology | Admitting: Obstetrics & Gynecology

## 2015-08-03 LAB — DIFFERENTIAL
BASOS ABS: 0 10*3/uL (ref 0–0.1)
BASOS PCT: 0 %
Eosinophils Absolute: 0.1 10*3/uL (ref 0–0.7)
Eosinophils Relative: 1 %
LYMPHS ABS: 1.3 10*3/uL (ref 1.0–3.6)
LYMPHS PCT: 18 %
MONOS PCT: 8 %
Monocytes Absolute: 0.5 10*3/uL (ref 0.2–0.9)
NEUTROS ABS: 5.3 10*3/uL (ref 1.4–6.5)
Neutrophils Relative %: 73 %

## 2015-08-03 LAB — CBC
HEMATOCRIT: 29.5 % — AB (ref 35.0–47.0)
HEMOGLOBIN: 9.4 g/dL — AB (ref 12.0–16.0)
MCH: 23.2 pg — ABNORMAL LOW (ref 26.0–34.0)
MCHC: 31.8 g/dL — ABNORMAL LOW (ref 32.0–36.0)
MCV: 72.8 fL — ABNORMAL LOW (ref 80.0–100.0)
Platelets: 231 10*3/uL (ref 150–440)
RBC: 4.05 MIL/uL (ref 3.80–5.20)
RDW: 19.3 % — ABNORMAL HIGH (ref 11.5–14.5)
WBC: 7.3 10*3/uL (ref 3.6–11.0)

## 2015-08-03 LAB — TYPE AND SCREEN
ABO/RH(D): A POS
Antibody Screen: NEGATIVE
EXTEND SAMPLE REASON: UNDETERMINED

## 2015-08-03 LAB — ABO/RH: ABO/RH(D): A POS

## 2015-08-04 ENCOUNTER — Inpatient Hospital Stay
Admission: AD | Admit: 2015-08-04 | Discharge: 2015-08-06 | DRG: 765 | Disposition: A | Discharge status: Home / Self Care | Payer: 59 | Source: Ambulatory Visit | Attending: Obstetrics & Gynecology | Admitting: Obstetrics & Gynecology

## 2015-08-04 ENCOUNTER — Inpatient Hospital Stay: Payer: 59 | Admitting: Anesthesiology

## 2015-08-04 ENCOUNTER — Encounter
Admission: AD | Disposition: A | Discharge status: Home / Self Care | Payer: Self-pay | Source: Ambulatory Visit | Attending: Obstetrics & Gynecology

## 2015-08-04 DIAGNOSIS — E669 Obesity, unspecified: Secondary | ICD-10-CM | POA: Diagnosis present

## 2015-08-04 DIAGNOSIS — O34211 Maternal care for low transverse scar from previous cesarean delivery: Principal | ICD-10-CM | POA: Diagnosis present

## 2015-08-04 DIAGNOSIS — O99214 Obesity complicating childbirth: Secondary | ICD-10-CM | POA: Diagnosis present

## 2015-08-04 DIAGNOSIS — Z3A39 39 weeks gestation of pregnancy: Secondary | ICD-10-CM | POA: Diagnosis not present

## 2015-08-04 DIAGNOSIS — O09523 Supervision of elderly multigravida, third trimester: Secondary | ICD-10-CM

## 2015-08-04 DIAGNOSIS — Z6841 Body Mass Index (BMI) 40.0 and over, adult: Secondary | ICD-10-CM | POA: Diagnosis not present

## 2015-08-04 DIAGNOSIS — O24429 Gestational diabetes mellitus in childbirth, unspecified control: Secondary | ICD-10-CM | POA: Diagnosis present

## 2015-08-04 DIAGNOSIS — Z23 Encounter for immunization: Secondary | ICD-10-CM | POA: Diagnosis not present

## 2015-08-04 DIAGNOSIS — D62 Acute posthemorrhagic anemia: Secondary | ICD-10-CM | POA: Diagnosis not present

## 2015-08-04 DIAGNOSIS — O34219 Maternal care for unspecified type scar from previous cesarean delivery: Secondary | ICD-10-CM | POA: Diagnosis present

## 2015-08-04 DIAGNOSIS — O9081 Anemia of the puerperium: Secondary | ICD-10-CM | POA: Diagnosis not present

## 2015-08-04 LAB — RPR: RPR: NONREACTIVE

## 2015-08-04 LAB — HIV ANTIBODY (ROUTINE TESTING W REFLEX): HIV SCREEN 4TH GENERATION: NONREACTIVE

## 2015-08-04 SURGERY — Surgical Case
Anesthesia: Spinal

## 2015-08-04 MED ORDER — SIMETHICONE 80 MG PO CHEW
80.0000 mg | CHEWABLE_TABLET | ORAL | Status: DC
  Filled 2015-08-04 (×2): qty 1

## 2015-08-04 MED ORDER — NALBUPHINE HCL 10 MG/ML IJ SOLN: 5.0000 mg | Freq: Once | INTRAMUSCULAR | Status: AC | PRN

## 2015-08-04 MED ORDER — IBUPROFEN 600 MG PO TABS: 600.0000 mg | ORAL_TABLET | Freq: Four times a day (QID) | ORAL | Status: DC

## 2015-08-04 MED ORDER — BUPIVACAINE HCL 0.25 % IJ SOLN
INTRAMUSCULAR | Status: DC | PRN
  Administered 2015-08-04: 10 mL

## 2015-08-04 MED ORDER — MORPHINE SULFATE (PF) 0.5 MG/ML IJ SOLN
INTRAMUSCULAR | Status: DC | PRN
  Administered 2015-08-04: .2 mg via EPIDURAL

## 2015-08-04 MED ORDER — NALOXONE HCL 0.4 MG/ML IJ SOLN
0.4000 mg | INTRAMUSCULAR | Status: DC | PRN
Start: 2015-08-04 — End: 2015-08-05

## 2015-08-04 MED ORDER — PRENATAL MULTIVITAMIN CH
1.0000 | ORAL_TABLET | Freq: Every day | ORAL | Status: DC
  Administered 2015-08-05 – 2015-08-06 (×2): 1 via ORAL
  Filled 2015-08-04 (×3): qty 1

## 2015-08-04 MED ORDER — BUPIVACAINE HCL (PF) 0.5 % IJ SOLN: 10.0000 mL | Freq: Once | INTRAMUSCULAR | Status: DC

## 2015-08-04 MED ORDER — SIMETHICONE 80 MG PO CHEW: 80.0000 mg | CHEWABLE_TABLET | ORAL | Status: DC | PRN

## 2015-08-04 MED ORDER — DIPHENHYDRAMINE HCL 25 MG PO CAPS: 25.0000 mg | ORAL_CAPSULE | Freq: Four times a day (QID) | ORAL | Status: DC | PRN

## 2015-08-04 MED ORDER — BUPIVACAINE HCL (PF) 0.5 % IJ SOLN
INTRAMUSCULAR | Status: AC
  Filled 2015-08-04: qty 30

## 2015-08-04 MED ORDER — BUPIVACAINE 0.25 % ON-Q PUMP DUAL CATH 400 ML
INJECTION | Status: AC
  Filled 2015-08-04: qty 400

## 2015-08-04 MED ORDER — WITCH HAZEL-GLYCERIN EX PADS: 1.0000 "application " | MEDICATED_PAD | CUTANEOUS | Status: DC | PRN

## 2015-08-04 MED ORDER — SENNOSIDES-DOCUSATE SODIUM 8.6-50 MG PO TABS
2.0000 | ORAL_TABLET | ORAL | Status: DC
  Administered 2015-08-05: 2 via ORAL
  Filled 2015-08-04 (×2): qty 2

## 2015-08-04 MED ORDER — SUGAMMADEX SODIUM 200 MG/2ML IV SOLN: INTRAVENOUS | Status: DC | PRN

## 2015-08-04 MED ORDER — PHENYLEPHRINE HCL 10 MG/ML IJ SOLN
INTRAMUSCULAR | Status: DC | PRN
  Administered 2015-08-04 (×6): 100 ug via INTRAVENOUS

## 2015-08-04 MED ORDER — DIBUCAINE 1 % RE OINT: 1.0000 "application " | TOPICAL_OINTMENT | RECTAL | Status: DC | PRN

## 2015-08-04 MED ORDER — NALOXONE HCL 2 MG/2ML IJ SOSY
1.0000 ug/kg/h | PREFILLED_SYRINGE | INTRAVENOUS | Status: DC | PRN
  Filled 2015-08-04: qty 2

## 2015-08-04 MED ORDER — ACETAMINOPHEN 325 MG PO TABS: 650.0000 mg | ORAL_TABLET | ORAL | Status: DC | PRN

## 2015-08-04 MED ORDER — LACTATED RINGERS IV SOLN
INTRAVENOUS | Status: DC
  Administered 2015-08-04 – 2015-08-05 (×3): via INTRAVENOUS

## 2015-08-04 MED ORDER — MENTHOL 3 MG MT LOZG
1.0000 | LOZENGE | OROMUCOSAL | Status: DC | PRN
  Filled 2015-08-04: qty 9

## 2015-08-04 MED ORDER — LANOLIN HYDROUS EX OINT: 1.0000 "application " | TOPICAL_OINTMENT | CUTANEOUS | Status: DC | PRN

## 2015-08-04 MED ORDER — CLINDAMYCIN PHOSPHATE 900 MG/50ML IV SOLN
900.0000 mg | Freq: Once | INTRAVENOUS | Status: AC
  Administered 2015-08-04: 900 mg via INTRAVENOUS
  Filled 2015-08-04: qty 50

## 2015-08-04 MED ORDER — NALBUPHINE HCL 10 MG/ML IJ SOLN
5.0000 mg | Freq: Once | INTRAMUSCULAR | Status: AC | PRN
  Administered 2015-08-04: 5 mg via INTRAVENOUS
  Filled 2015-08-04: qty 1

## 2015-08-04 MED ORDER — LACTATED RINGERS IV SOLN
INTRAVENOUS | Status: DC
  Administered 2015-08-04 (×3): via INTRAVENOUS

## 2015-08-04 MED ORDER — NALBUPHINE HCL 10 MG/ML IJ SOLN: 5.0000 mg | INTRAMUSCULAR | Status: DC | PRN

## 2015-08-04 MED ORDER — OXYTOCIN 40 UNITS IN LACTATED RINGERS INFUSION - SIMPLE MED
INTRAVENOUS | Status: AC
  Filled 2015-08-04: qty 1000

## 2015-08-04 MED ORDER — MORPHINE SULFATE (PF) 2 MG/ML IV SOLN: 1.0000 mg | INTRAVENOUS | Status: DC | PRN

## 2015-08-04 MED ORDER — DIPHENHYDRAMINE HCL 50 MG/ML IJ SOLN
12.5000 mg | INTRAMUSCULAR | Status: DC | PRN
Start: 2015-08-04 — End: 2015-08-06
  Administered 2015-08-04 – 2015-08-05 (×2): 12.5 mg via INTRAVENOUS
  Filled 2015-08-04 (×2): qty 1

## 2015-08-04 MED ORDER — VARICELLA VIRUS VACCINE LIVE 1350 PFU/0.5ML IJ SUSR
0.5000 mL | Freq: Once | INTRAMUSCULAR | Status: AC
  Administered 2015-08-06: 0.5 mL via SUBCUTANEOUS
  Filled 2015-08-04 (×2): qty 0.5

## 2015-08-04 MED ORDER — FENTANYL CITRATE (PF) 100 MCG/2ML IJ SOLN
25.0000 ug | INTRAMUSCULAR | Status: DC | PRN
  Administered 2015-08-04: 25 ug via INTRAVENOUS
  Filled 2015-08-04: qty 2

## 2015-08-04 MED ORDER — DIPHENHYDRAMINE HCL 25 MG PO CAPS: 25.0000 mg | ORAL_CAPSULE | ORAL | Status: DC | PRN

## 2015-08-04 MED ORDER — SODIUM CHLORIDE 0.9% FLUSH: 3.0000 mL | INTRAVENOUS | Status: DC | PRN

## 2015-08-04 MED ORDER — CITRIC ACID-SODIUM CITRATE 334-500 MG/5ML PO SOLN
30.0000 mL | ORAL | Status: AC
  Administered 2015-08-04: 30 mL via ORAL

## 2015-08-04 MED ORDER — LACTATED RINGERS IV SOLN
2.5000 [IU]/h | INTRAVENOUS | Status: AC
  Filled 2015-08-04: qty 4

## 2015-08-04 MED ORDER — OXYCODONE-ACETAMINOPHEN 5-325 MG PO TABS
1.0000 | ORAL_TABLET | ORAL | Status: DC | PRN
  Administered 2015-08-05 – 2015-08-06 (×5): 1 via ORAL
  Filled 2015-08-04 (×5): qty 1

## 2015-08-04 MED ORDER — MEPERIDINE HCL 25 MG/ML IJ SOLN: 6.2500 mg | INTRAMUSCULAR | Status: DC | PRN

## 2015-08-04 MED ORDER — ONDANSETRON HCL 4 MG/2ML IJ SOLN
INTRAMUSCULAR | Status: DC | PRN
  Administered 2015-08-04: 4 mg via INTRAVENOUS

## 2015-08-04 MED ORDER — ONDANSETRON HCL 4 MG/2ML IJ SOLN: 4.0000 mg | Freq: Once | INTRAMUSCULAR | Status: DC | PRN

## 2015-08-04 MED ORDER — ONDANSETRON HCL 4 MG/2ML IJ SOLN: 4.0000 mg | Freq: Three times a day (TID) | INTRAMUSCULAR | Status: DC | PRN

## 2015-08-04 MED ORDER — BUPIVACAINE 0.25 % ON-Q PUMP DUAL CATH 400 ML: 400.0000 mL | INJECTION | Status: DC

## 2015-08-04 MED ORDER — SCOPOLAMINE 1 MG/3DAYS TD PT72: 1.0000 | MEDICATED_PATCH | Freq: Once | TRANSDERMAL | Status: DC

## 2015-08-04 MED ORDER — LACTATED RINGERS IV SOLN
INTRAVENOUS | Status: DC
  Administered 2015-08-04: 09:00:00 via INTRAVENOUS

## 2015-08-04 MED ORDER — ZOLPIDEM TARTRATE 5 MG PO TABS: 5.0000 mg | ORAL_TABLET | Freq: Every evening | ORAL | Status: DC | PRN

## 2015-08-04 MED ORDER — KETOROLAC TROMETHAMINE 30 MG/ML IJ SOLN
30.0000 mg | Freq: Four times a day (QID) | INTRAMUSCULAR | Status: AC | PRN
  Administered 2015-08-04 – 2015-08-05 (×3): 30 mg via INTRAVENOUS
  Filled 2015-08-04 (×4): qty 1

## 2015-08-04 MED ORDER — CITRIC ACID-SODIUM CITRATE 334-500 MG/5ML PO SOLN
ORAL | Status: AC
  Administered 2015-08-04: 30 mL via ORAL
  Filled 2015-08-04: qty 15

## 2015-08-04 MED ORDER — BUPIVACAINE IN DEXTROSE 0.75-8.25 % IT SOLN
INTRATHECAL | Status: DC | PRN
  Administered 2015-08-04: 1.7 mL via INTRATHECAL

## 2015-08-04 MED ORDER — OXYTOCIN 40 UNITS IN LACTATED RINGERS INFUSION - SIMPLE MED
INTRAVENOUS | Status: DC | PRN
  Administered 2015-08-04: 800 mL via INTRAVENOUS

## 2015-08-04 MED ORDER — SIMETHICONE 80 MG PO CHEW
80.0000 mg | CHEWABLE_TABLET | Freq: Three times a day (TID) | ORAL | Status: DC
  Administered 2015-08-04 – 2015-08-06 (×5): 80 mg via ORAL
  Filled 2015-08-04 (×7): qty 1

## 2015-08-04 MED ORDER — KETOROLAC TROMETHAMINE 30 MG/ML IJ SOLN
30.0000 mg | Freq: Four times a day (QID) | INTRAMUSCULAR | Status: DC | PRN
  Filled 2015-08-04: qty 1

## 2015-08-04 SURGICAL SUPPLY — 23 items
CANISTER SUCT 3000ML (MISCELLANEOUS) ×3 IMPLANT
CATH KIT ON-Q SILVERSOAK 5IN (CATHETERS) ×6 IMPLANT
CHLORAPREP W/TINT 26ML (MISCELLANEOUS) ×6 IMPLANT
DRSG OPSITE POSTOP 4X10 (GAUZE/BANDAGES/DRESSINGS) ×3 IMPLANT
ELECT CAUTERY BLADE 6.4 (BLADE) ×3 IMPLANT
ELECT REM PT RETURN 9FT ADLT (ELECTROSURGICAL) ×3
ELECTRODE REM PT RTRN 9FT ADLT (ELECTROSURGICAL) ×1 IMPLANT
GLOVE SKINSENSE NS SZ8.0 LF (GLOVE) ×2
GLOVE SKINSENSE STRL SZ8.0 LF (GLOVE) ×1 IMPLANT
GOWN STRL REUS W/ TWL LRG LVL3 (GOWN DISPOSABLE) ×1 IMPLANT
GOWN STRL REUS W/ TWL XL LVL3 (GOWN DISPOSABLE) ×2 IMPLANT
GOWN STRL REUS W/TWL LRG LVL3 (GOWN DISPOSABLE) ×3
GOWN STRL REUS W/TWL XL LVL3 (GOWN DISPOSABLE) ×6
LIQUID BAND (GAUZE/BANDAGES/DRESSINGS) ×3 IMPLANT
NS IRRIG 1000ML POUR BTL (IV SOLUTION) ×3 IMPLANT
PACK C SECTION AR (MISCELLANEOUS) ×3 IMPLANT
PAD OB MATERNITY 4.3X12.25 (PERSONAL CARE ITEMS) ×3 IMPLANT
PAD PREP 24X41 OB/GYN DISP (PERSONAL CARE ITEMS) ×3 IMPLANT
SUT MAXON ABS #0 GS21 30IN (SUTURE) ×6 IMPLANT
SUT PLAIN 2 0 XLH (SUTURE) ×3 IMPLANT
SUT VIC AB 1 CT1 36 (SUTURE) ×9 IMPLANT
SUT VIC AB 2-0 CT1 36 (SUTURE) ×3 IMPLANT
SUT VIC AB 4-0 FS2 27 (SUTURE) ×6 IMPLANT

## 2015-08-04 NOTE — Progress Notes (Signed)
Advance Directive Edu. Provided and Pastoral Care and prayer.

## 2015-08-04 NOTE — Op Note (Signed)
Cesarean Section Procedure Note Indications: prior cesarean section and term intrauterine pregnancy, Desire for permanent sterility  Pre-operative Diagnosis: Intrauterine pregnancy [redacted]w[redacted]d ;  prior cesarean section and term intrauterine pregnancy, Desire for permanent sterility Post-operative Diagnosis: same, delivered. Procedure: Low Transverse Cesarean Section, Bilateral Tubal Ligation Surgeon: Annamarie Major, MD Assistant(s): DrWard Anesthesia: Spinal anesthesia Estimated Blood Loss:500 mL Complications: None; patient tolerated the procedure well. Disposition: PACU - hemodynamically stable. Condition: stable  Findings: A female infant in the cephalic presentation. Amniotic fluid - Clear  Birth weight 7-2 lb  Apgars of 9 and 9.  Intact placenta with a three-vessel cord. Grossly normal uterus, tubes and ovaries bilaterally. no intraabdominal adhesions were noted.  Procedure Details   The patient was taken to Operating Room, identified as the correct patient and the procedure verified as C-Section Delivery. A Time Out was held and the above information confirmed. After induction of anesthesia, the patient was draped and prepped in the usual sterile manner. A Pfannenstiel incision was made and carried down through the subcutaneous tissue to the fascia. Fascial incision was made and extended transversely with the Mayo scissors. The fascia was separated from the underlying rectus tissue superiorly and inferiorly. The peritoneum was identified and entered bluntly. Peritoneal incision was extended longitudinally. The utero-vesical peritoneal reflection was incised transversely and a bladder flap was created digitally.  A low transverse hysterotomy was made. The fetus was delivered atraumatically. The umbilical cord was clamped x2 and cut and the infant was handed to the awaiting pediatricians. The placenta was removed intact and appeared normal with a 3-vessel cord.  The uterus was exteriorized and  cleared of all clot and debris. The hysterotomy was closed with running sutures of 0 Vicryl suture. A second imbricating layer was placed with the same suture. Excellent hemostasis was observed.   The left Fallopian tube was identified, grasped with the Babcock clamps, lifted to the skin incision and followed out distally to the fimbriae. An avascular midsection of the tube approximately 3-4cm from the cornua was grasped with the babcock clamps and brought into a knuckle at the skin incision. The tube was double ligated with 2-0 Vicryl suture and the intervening portion of tube was transected and removed. Excellent hemostasis was noted and the tube was returned to the abdomen. Attention was then turned to the right fallopian tube after confirmation of identification by tracing the tube out to the fimbriae. The same procedure was then performed on the right Fallopian tube. Again, excellent hemostasis was noted at the end of the procedure.  The uterus was returned to the abdomen. The pelvis was irrigated and again, excellent hemostasis was noted.  The On Q Pain pump System was then placed.  Trocars were placed through the abdominal wall into the subfascial space and these were used to thread the silver soaker cathaters into place.The rectus fascia was then reapproximated with running sutures of Maxon, with careful placement not to incorporate the cathaters. Subcutaneous tissues are then irrigated with saline and hemostasis assured.  Skin is then closed with 4-0 vicryl suture in a subcuticular fashion followed by skin adhesive. The cathaters are flushed each with 5 mL of Bupivicaine and stabilized into place with dressing. Instrument, sponge, and needle counts were correct prior to the abdominal closure and at the conclusion of the case.  The patient tolerated the procedure well and was transferred to the recovery room in stable condition.

## 2015-08-04 NOTE — Anesthesia Procedure Notes (Signed)
Spinal Patient location during procedure: OR Start time: 08/04/2015 11:05 AM End time: 08/04/2015 11:11 AM Staffing Resident/CRNA: Almeta Monas Performed by: resident/CRNA  Preanesthetic Checklist Completed: patient identified, surgical consent, pre-op evaluation, timeout performed, IV checked, risks and benefits discussed and monitors and equipment checked Spinal Block Patient position: sitting Prep: ChloraPrep Patient monitoring: continuous pulse ox, heart rate and blood pressure Approach: midline Location: L3-4 Injection technique: single-shot Needle Needle type: Whitacre  Needle gauge: 25 G Assessment Sensory level: T4

## 2015-08-04 NOTE — Discharge Instructions (Signed)
Cesarean Delivery, Care After  Refer to this sheet in the next few weeks. These instructions provide you with information on caring for yourself after your procedure. Your health care provider may also give you specific instructions. Your treatment has been planned according to current medical practices, but problems sometimes occur. Call your health care provider if you have any problems or questions after you go home.  HOME CARE INSTRUCTIONS   Only take over-the-counter or prescription medications as directed by your health care provider.   Do not drink alcohol, especially if you are breastfeeding or taking medication to relieve pain.   Do not chew or smoke tobacco.   Continue to use good perineal care. Good perineal care includes:    Wiping your perineum from front to back.    Keeping your perineum clean.   Check your surgical cut (incision) daily for increased redness, drainage, swelling, or separation of skin.   Clean your incision gently with soap and water every day, and then pat it dry. If your health care provider says it is okay, leave the incision uncovered. Use a bandage (dressing) if the incision is draining fluid or appears irritated. If the adhesive strips across the incision do not fall off within 7 days, carefully peel them off.   Hug a pillow when coughing or sneezing until your incision is healed. This helps to relieve pain.   Do not use tampons or douche until your health care provider says it is okay.   Shower, wash your hair, and take tub baths as directed by your health care provider.   Wear a well-fitting bra that provides breast support.   Limit wearing support panties or control-top hose.   Drink enough fluids to keep your urine clear or pale yellow.   Eat high-fiber foods such as whole grain cereals and breads, brown rice, beans, and fresh fruits and vegetables every day. These foods may help prevent or relieve constipation.   Resume activities such as climbing stairs,  driving, lifting, exercising, or traveling as directed by your health care provider.   Talk to your health care provider about resuming sexual activities. This is dependent upon your risk of infection, your rate of healing, and your comfort and desire to resume sexual activity.   Try to have someone help you with your household activities and your newborn for at least a few days after you leave the hospital.   Rest as much as possible. Try to rest or take a nap when your newborn is sleeping.   Increase your activities gradually.   Keep all of your scheduled postpartum appointments. It is very important to keep your scheduled follow-up appointments. At these appointments, your health care provider will be checking to make sure that you are healing physically and emotionally.  SEEK MEDICAL CARE IF:    You are passing large clots from your vagina. Save any clots to show your health care provider.   You have a foul smelling discharge from your vagina.   You have trouble urinating.   You are urinating frequently.   You have pain when you urinate.   You have a change in your bowel movements.   You have increasing redness, pain, or swelling near your incision.   You have pus draining from your incision.   Your incision is separating.   You have painful, hard, or reddened breasts.   You have a severe headache.   You have blurred vision or see spots.   You feel sad   or depressed.   You have thoughts of hurting yourself or your newborn.   You have questions about your care, the care of your newborn, or medications.   You are dizzy or light-headed.   You have a rash.   You have pain, redness, or swelling at the site of the removed intravenous access (IV) tube.   You have nausea or vomiting.   You stopped breastfeeding and have not had a menstrual period within 12 weeks of stopping.   You are not breastfeeding and have not had a menstrual period within 12 weeks of delivery.   You have a fever.  SEEK  IMMEDIATE MEDICAL CARE IF:   You have persistent pain.   You have chest pain.   You have shortness of breath.   You faint.   You have leg pain.   You have stomach pain.   Your vaginal bleeding saturates 2 or more sanitary pads in 1 hour.  MAKE SURE YOU:    Understand these instructions.   Will watch your condition.   Will get help right away if you are not doing well or get worse.     This information is not intended to replace advice given to you by your health care provider. Make sure you discuss any questions you have with your health care provider.     Document Released: 03/03/2002 Document Revised: 07/02/2014 Document Reviewed: 02/06/2012  Elsevier Interactive Patient Education 2016 Elsevier Inc[employer]

## 2015-08-04 NOTE — Anesthesia Preprocedure Evaluation (Signed)
Anesthesia Evaluation  Patient identified by MRN, date of birth, ID band Patient awake    Reviewed: Allergy & Precautions, NPO status , Patient's Chart, lab work & pertinent test results, reviewed documented beta blocker date and time   Airway Mallampati: III  TM Distance: >3 FB     Dental  (+) Chipped   Pulmonary sleep apnea , former smoker,           Cardiovascular      Neuro/Psych Anxiety    GI/Hepatic   Endo/Other    Renal/GU      Musculoskeletal   Abdominal   Peds  Hematology   Anesthesia Other Findings Obese. Malrotation of intestines.  Reproductive/Obstetrics                             Anesthesia Physical Anesthesia Plan  ASA: III  Anesthesia Plan: Spinal   Post-op Pain Management:    Induction:   Airway Management Planned:   Additional Equipment:   Intra-op Plan:   Post-operative Plan:   Informed Consent: I have reviewed the patients History and Physical, chart, labs and discussed the procedure including the risks, benefits and alternatives for the proposed anesthesia with the patient or authorized representative who has indicated his/her understanding and acceptance.     Plan Discussed with: CRNA  Anesthesia Plan Comments:         Anesthesia Quick Evaluation

## 2015-08-04 NOTE — Transfer of Care (Signed)
Immediate Anesthesia Transfer of Care Note  Patient: Amber [name]  Procedure(s) Performed: Procedure(s): CESAREAN SECTION WITH BILATERAL TUBAL LIGATION (N/A)  Patient Location: PACU  Anesthesia Type:Spinal  Level of Consciousness: awake, alert  and oriented  Airway & Oxygen Therapy: Patient Spontanous Breathing  Post-op Assessment: Report given to RN and Post -op Vital signs reviewed and stable  Post vital signs: Reviewed and stable  Last Vitals:  Filed Vitals:   08/04/15 0821  BP: 135/64  Pulse: 83  Temp: 36.6 C  Resp: 18    Complications: No apparent anesthesia complications

## 2015-08-04 NOTE — Discharge Summary (Signed)
Obstetrical Discharge Summary  Date of Admission: 08/04/2015 Date of Discharge: 08/06/2015 Discharge Diagnosis: Term Pregnancy-delivered and history of cesarean section/ anemia (during pregnancy and worsened with blood loss) Primary OB:  [facility]   Gestational Age at Delivery: [redacted]w[redacted]d  Antepartum complications: Obesity and pregnancy, Prior Cesarean Date of Delivery: 08/04/15  Delivered By: Dr Tiburcio Pea Delivery Type: repeat cesarean section, low transverse incision and bilateral tubal ligation Intrapartum complications/course: None Anesthesia: spinal Placenta: manual removal Laceration: n/a Episiotomy: none Live born female  Birth Weight: 7 lb 2.6 oz (3250 g) APGAR: 9, 9   Post partum course: Since the delivery, patient has tolerated activity, diet (passing flatus), and daily functions without difficulty or complication.  Is anemic with post operative hemoglobin of 7.4 gm/dl. Not lightheaded except when in warm shower last night. No tachycardia. Min lochia.  Has switched to bottle feeding as nipples were bleeding. Has a history of depression and was very emotional last night, and was crying, some of which was related to breast feeding difficulties. Her ON Q pump fell out last night when she was up to the bathroom, but her pain is fairly well controlled on percocet. On POD #2 she requested discharge and was deemed stable for discharge. Husband will be home with her for the next two weeks and she was given safety precautions regarding her anemia.   Postpartum Exam:General appearance: alert, cooperative, no distress and trying to calm baby GI: soft, non-tender; bowel sounds normal; no masses,  no organomegaly and Fundus firm and below umbilicus/ML Extremities: edema +1 in lower extremities and Homans sign is negative, no sign of DVT Heart: RRR without murmur; Lungs: CTAB BP 121/84 mmHg  Pulse 86  Temp(Src) 98.3 F (36.8 C) (Axillary)  Resp 18  Ht 5' 1.5" (1.562 m)  Wt 104.781 kg (231 lb)  BMI  42.95 kg/m2  SpO2 97%  LMP 10/30/2014  Breastfeeding? Unknown Disposition: home with infant Rh Immune globulin given: not applicable Rubella vaccine given: not applicable Varicella vaccine given: yes Tdap vaccine given in AP or PP setting: given during prenatal care Flu vaccine given in AP or PP setting: given during prenatal care Contraception: bilateral tubal ligation  Prenatal Labs: A POS//Rubella Immune//Varicella NI/ RPR negative//HIV negative/HepB Surface Ag negative//plans to formula feed   Results for orders placed or performed during the hospital encounter of 08/04/15 (from the past 48 hour(s))  CBC     Status: Abnormal   Collection Time: 08/05/15  5:51 AM  Result Value Ref Range   WBC 7.6 3.6 - 11.0 K/uL   RBC 3.24 (L) 3.80 - 5.20 MIL/uL   Hemoglobin 7.4 (L) 12.0 - 16.0 g/dL    Comment: RESULT REPEATED AND VERIFIED   HCT 23.5 (L) 35.0 - 47.0 %   MCV 72.5 (L) 80.0 - 100.0 fL   MCH 22.9 (L) 26.0 - 34.0 pg   MCHC 31.6 (L) 32.0 - 36.0 g/dL   RDW 09.8 (H) 11.9 - 14.7 %   Platelets 166 150 - 440 K/uL  Glucose, capillary     Status: None   Collection Time: 08/06/15  5:34 AM  Result Value Ref Range   Glucose-Capillary 74 65 - 99 mg/dL  Glucose, random     Status: None   Collection Time: 08/06/15  6:14 AM  Result Value Ref Range   Glucose, Bld 68 65 - 99 mg/dL    Plan:  DENESHIA ZUCKER was discharged to home in good condition. Follow-up appointment with [facility] provider in 1 week  Discharge  Medications:   Medication List    STOP taking these medications        aspirin 81 MG tablet     IRON (FERROUS GLUCONATE) PO      TAKE these medications        buPROPion 150 MG 12 hr tablet  Commonly known as:  WELLBUTRIN SR  Take 1 tablet (150 mg total) by mouth daily. For 1 week then can increase to BID     ferrous fumarate 325 (106 Fe) MG Tabs tablet  Commonly known as:  HEMOCYTE - 106 mg FE  Take 1 tablet (106 mg of iron total) by mouth daily.     ibuprofen 600 MG  tablet  Commonly known as:  ADVIL,MOTRIN  Take 1 tablet (600 mg total) by mouth every 6 (six) hours as needed for mild pain, moderate pain or cramping.     multivitamin-prenatal 27-0.8 MG Tabs tablet  Take 1 tablet by mouth daily at 12 noon. Reported on 06/16/2015     oxyCODONE-acetaminophen 5-325 MG tablet  Commonly known as:  PERCOCET/ROXICET  Take 1-2 tablets by mouth every 4 (four) hours as needed (pain scale 4-7).       Can take Colace/ Miralax as needed  Dean Wonder, CNM

## 2015-08-04 NOTE — H&P (Signed)
History and Physical Interval Note:  08/04/2015 9:32 AM  Amber [name]  has presented today for surgery, with the diagnosis of PRIOR C SECTION, DESIRE FOR STERILITY, TERM PREGNANCY  The various methods of treatment have been discussed with the patient and family. After consideration of risks, benefits and other options for treatment, the patient has consented to  Procedure(s): CESAREAN SECTION WITH BILATERAL TUBAL LIGATION (N/A) as a surgical intervention .  The patient's history has been reviewed, patient examined, no change in status, stable for surgery.  Pt has the following beta blocker history-  Not taking Beta Blocker.  I have reviewed the patient's chart and labs.  Questions were answered to the patient's satisfaction.    Letitia Libra

## 2015-08-04 NOTE — Consult Note (Signed)
Neonatology Note:   Attendance at C-section:   I was asked by Dr. Harris to attend this repeat C/S at term. The mother is a G2P1 A pos, GBS neg with abnormal 1 hour glucose tolerance, but reportedly normal 3 hour test. ROM at delivery, fluid clear. Infant vigorous with good spontaneous cry and tone. Needed no suctioning. Ap 9/9. Lungs clear to ausc in DR. To CN to care of Pediatrician.  Jasai Sorg C. Johnte Portnoy, MD 

## 2015-08-05 LAB — CBC
HCT: 23.5 % — ABNORMAL LOW (ref 35.0–47.0)
HEMOGLOBIN: 7.4 g/dL — AB (ref 12.0–16.0)
MCH: 22.9 pg — AB (ref 26.0–34.0)
MCHC: 31.6 g/dL — ABNORMAL LOW (ref 32.0–36.0)
MCV: 72.5 fL — ABNORMAL LOW (ref 80.0–100.0)
PLATELETS: 166 10*3/uL (ref 150–440)
RBC: 3.24 MIL/uL — ABNORMAL LOW (ref 3.80–5.20)
RDW: 19.7 % — ABNORMAL HIGH (ref 11.5–14.5)
WBC: 7.6 10*3/uL (ref 3.6–11.0)

## 2015-08-05 LAB — SURGICAL PATHOLOGY

## 2015-08-05 MED ORDER — IBUPROFEN 600 MG PO TABS: 600.0000 mg | ORAL_TABLET | Freq: Four times a day (QID) | ORAL | Status: DC

## 2015-08-05 MED ORDER — IBUPROFEN 600 MG PO TABS
600.0000 mg | ORAL_TABLET | Freq: Four times a day (QID) | ORAL | Status: DC
  Administered 2015-08-05 – 2015-08-06 (×5): 600 mg via ORAL
  Filled 2015-08-05 (×6): qty 1

## 2015-08-05 NOTE — Anesthesia Post-op Follow-up Note (Signed)
  Anesthesia Pain Follow-up Note  Patient: Amber [name]  Day #: 1  Date of Follow-up: 08/05/2015 Time: 7:27 AM  Last Vitals:  Filed Vitals:   08/04/15 2346 08/05/15 0402  BP: 109/53 110/51  Pulse: 70 78  Temp: 36.7 C 36.6 C  Resp: 18     Level of Consciousness: alert  Pain: mild   Side Effects:None  Catheter Site Exam:clean, dry, no drainage  Plan: Continue current therapy  Cosandra Plouffe,  Sheran Fava

## 2015-08-05 NOTE — Anesthesia Postprocedure Evaluation (Signed)
Anesthesia Post Note  Patient: Amber [name]  Procedure(s) Performed: Procedure(s) (LRB): CESAREAN SECTION WITH BILATERAL TUBAL LIGATION (N/A)  Patient location during evaluation: Mother Baby Anesthesia Type: Spinal Level of consciousness: awake, awake and alert and oriented Pain management: pain level controlled Vital Signs Assessment: post-procedure vital signs reviewed and stable Respiratory status: spontaneous breathing, nonlabored ventilation and respiratory function stable Cardiovascular status: blood pressure returned to baseline and stable Postop Assessment: no headache and no backache Anesthetic complications: no    Last Vitals:  Filed Vitals:   08/04/15 2346 08/05/15 0402  BP: 109/53 110/51  Pulse: 70 78  Temp: 36.7 C 36.6 C  Resp: 18     Last Pain:  Filed Vitals:   08/05/15 0616  PainSc: 4                  Destenie Ingber,  Sheran Fava

## 2015-08-05 NOTE — Progress Notes (Signed)
Obstetric Postpartum/PostOperative Daily Progress Note Subjective:  35 y.o. [id number] status post repeat cesarean delivery and bilateral tubal ligation.  Puerperium complicated by obesity and gestational diabetes. She is ambulating, is tolerating po, is voiding spontaneously.  Her pain is well controlled on PO pain medications. Her lochia is equal to her normal menses. She states she is feeling emotional this morning but has a good support system, her husband is in the room holding the baby.   Medications SCHEDULED MEDICATIONS  . ibuprofen  600 mg Oral 4 times per day  . prenatal multivitamin  1 tablet Oral Q1200  . scopolamine  1 patch Transdermal Once  . senna-docusate  2 tablet Oral Q24H  . simethicone  80 mg Oral TID PC  . simethicone  80 mg Oral Q24H  . varicella virus vaccine live  0.5 mL Subcutaneous Once    MEDICATION INFUSIONS  . bupivacaine 0.25 % ON-Q pump DUAL CATH 400 mL    . lactated ringers 125 mL/hr at 08/05/15 1005    PRN MEDICATIONS  acetaminophen, witch hazel-glycerin **AND** dibucaine, diphenhydrAMINE **OR** diphenhydrAMINE, ketorolac **OR** [DISCONTINUED] ketorolac, lanolin, menthol-cetylpyridinium, morphine injection, ondansetron (ZOFRAN) IV, oxyCODONE-acetaminophen, simethicone, zolpidem    Objective:   Filed Vitals:   08/04/15 2018 08/04/15 2346 08/05/15 0402 08/05/15 0700  BP: 125/55 109/53 110/51 108/47  Pulse: 78 70 78 74  Temp: 97.9 F (36.6 C) 98 F (36.7 C) 97.9 F (36.6 C) 98.7 F (37.1 C)  TempSrc: Oral Oral Oral Oral  Resp: Height:      Weight:      SpO2: 100% 100% 100% 97%    Current Vital Signs 24h Vital Sign Ranges  T 98.7 F (37.1 C) Temp  Avg: 98.3 F (36.8 C)  Min: 97.9 F (36.6 C)  Max: 98.8 F (37.1 C)  /BP (!) 108/47 mmHg BP  Min: 93/71  Max: 125/55  HR 74 Pulse  Avg: 75.4  Min: 66  Max: 92  RR 16 Resp  Avg: 18  Min: 16  Max: 20  SaO2 97 % Not Delivered SpO2  Avg: 99.1 %  Min: 95 %  Max: 100 %       24 Hour I/O  Current Shift I/O  Time Ins Outs 02/09 0701 - 02/10 0700 In: 2590.7 [I.V.:2590.7] Out: 2125 [Urine:1425] 02/10 0701 - 02/10 1900 In: -  Out: 800 [Urine:800]  General: NAD Pulmonary: CTAB, no increased work of breathing Abdomen: non-distended, non-tender, fundus firm at level of umbilicus Inc: Clean/dry/intact Extremities: no edema, no erythema, no tenderness  Labs:   Recent Labs Lab 08/03/15 1044 08/05/15 0551  WBC 7.3 7.6  HGB 9.4* 7.4*  HCT 29.5* 23.5*  PLT 231 166     Assessment:   Amber y.o. G2P2002 postoperative day # 1, s/p repeat cesarean section and bilateral tubal ligation. Puerperium complicated by obesity and gestational diabetes mellitus.   Plan:  1) Acute blood loss anemia - hemodynamically stable and asymptomatic - po ferrous sulfate  2) A POS / Rubella Immune (07/29 0000)/ Varicella vaccine administered 08/04/2015  3) TDAP status unknown  4) bottle feeding /Contraception = bilateral tubal ligation   5) Gestational Diabetes Mellitus complicating the puerperium - check fasting blood glucose tomorrow morning  6) Disposition to home postoperative day #3 if stable.  [name], PA-S 08/05/2015 9:36 PM  I have personally reviewed and edited this note as appropriate.  Conard Novak, MD, FACOG 08/05/2015 10:49 AM

## 2015-08-05 NOTE — Lactation Note (Signed)
This note was copied from a baby's chart. Lactation Consultation Note  Patient Name: Amber [name] [id number] Date: 08/05/2015 Reason for consult: Follow-up assessment;Breast surgery   Maternal Data Does the patient have breastfeeding experience prior to this delivery?: Yes Pt had breast reduction in 2004, unable to make enough milk to feed her other child who is now 37 yrs old,  Has attempted breastfeeding with this child, states baby cries and pulls away when she offers the breast, does not want to continue trying, wants to pump and provide colostrum to baby if she can, symphony breast pump set up for pt to use, pumped both breasts x 15 min. No milk collected, did have dot of blood on right nipple when she finished pumping, advised to decrease suction at next session,  She plans on obtaining a breast pump through her insurance for home use.  Encouraged pt to pump q 3hrs. Feeding Feeding Type: Bottle Fed - Formula  LATCH Score/Interventions                      Lactation Tools Discussed/Used Tools: Pump Breast pump type: Double-Electric Breast Pump WIC Program: No Pump Review: Setup, frequency, and cleaning;Milk Storage Initiated by:: Amber [name] RNC IBCLC         Date initiated:: 08/05/15   Consult Status Consult Status: Follow-up Date: 08/06/15 Follow-up type: In-patient    Dyann Kief 08/05/2015, 5:57 PM

## 2015-08-06 LAB — GLUCOSE, CAPILLARY: Glucose-Capillary: 74 mg/dL (ref 65–99)

## 2015-08-06 LAB — GLUCOSE, RANDOM: GLUCOSE: 68 mg/dL (ref 65–99)

## 2015-08-06 MED ORDER — OXYCODONE-ACETAMINOPHEN 5-325 MG PO TABS: 1.0000 | ORAL_TABLET | ORAL | Status: DC | PRN

## 2015-08-06 MED ORDER — BUPROPION HCL ER (SR) 150 MG PO TB12: 150.0000 mg | ORAL_TABLET | Freq: Every day | ORAL | Status: DC

## 2015-08-06 MED ORDER — IBUPROFEN 600 MG PO TABS: 600.0000 mg | ORAL_TABLET | Freq: Four times a day (QID) | ORAL | Status: DC | PRN

## 2015-08-06 MED ORDER — FERROUS FUMARATE 325 (106 FE) MG PO TABS: 1.0000 | ORAL_TABLET | Freq: Every day | ORAL | Status: DC

## 2015-08-06 NOTE — Progress Notes (Signed)
Discharge instructions complete. Prescriptions given by Skeet Latch., CNM. Patient verbalizes understanding of teaching. Patient discharged home at 1630.

## 2015-08-12 ENCOUNTER — Emergency Department
Admission: EM | Admit: 2015-08-12 | Discharge: 2015-08-12 | Disposition: A | Discharge status: Home / Self Care | Payer: 59 | Attending: Emergency Medicine | Admitting: Emergency Medicine

## 2015-08-12 ENCOUNTER — Emergency Department: Payer: 59

## 2015-08-12 ENCOUNTER — Encounter: Payer: Self-pay | Admitting: *Deleted

## 2015-08-12 DIAGNOSIS — Z79899 Other long term (current) drug therapy: Secondary | ICD-10-CM | POA: Insufficient documentation

## 2015-08-12 DIAGNOSIS — R51 Headache: Secondary | ICD-10-CM | POA: Diagnosis not present

## 2015-08-12 DIAGNOSIS — Z87891 Personal history of nicotine dependence: Secondary | ICD-10-CM | POA: Insufficient documentation

## 2015-08-12 DIAGNOSIS — R200 Anesthesia of skin: Secondary | ICD-10-CM | POA: Insufficient documentation

## 2015-08-12 DIAGNOSIS — R202 Paresthesia of skin: Secondary | ICD-10-CM | POA: Diagnosis not present

## 2015-08-12 DIAGNOSIS — R519 Headache, unspecified: Secondary | ICD-10-CM

## 2015-08-12 DIAGNOSIS — O9089 Other complications of the puerperium, not elsewhere classified: Secondary | ICD-10-CM | POA: Insufficient documentation

## 2015-08-12 DIAGNOSIS — Z880 Allergy status to penicillin: Secondary | ICD-10-CM | POA: Diagnosis not present

## 2015-08-12 LAB — COMPREHENSIVE METABOLIC PANEL
ALBUMIN: 3.7 g/dL (ref 3.5–5.0)
ALK PHOS: 48 U/L (ref 38–126)
ALT: 17 U/L (ref 14–54)
AST: 19 U/L (ref 15–41)
Anion gap: 10 (ref 5–15)
BILIRUBIN TOTAL: 0.7 mg/dL (ref 0.3–1.2)
BUN: 15 mg/dL (ref 6–20)
CO2: 26 mmol/L (ref 22–32)
CREATININE: 0.72 mg/dL (ref 0.44–1.00)
Calcium: 9 mg/dL (ref 8.9–10.3)
Chloride: 105 mmol/L (ref 101–111)
GFR calc Af Amer: >60 mL/min (ref >60)
GLUCOSE: 87 mg/dL (ref 65–99)
Potassium: 3.9 mmol/L (ref 3.5–5.1)
Sodium: 141 mmol/L (ref 135–145)
TOTAL PROTEIN: 7.1 g/dL (ref 6.5–8.1)

## 2015-08-12 LAB — CBC WITH DIFFERENTIAL/PLATELET
BASOS ABS: 0.1 10*3/uL (ref 0–0.1)
Basophils Relative: 1 %
EOS ABS: 0.4 10*3/uL (ref 0–0.7)
EOS PCT: 5 %
HCT: 33.1 % — ABNORMAL LOW (ref 35.0–47.0)
Hemoglobin: 10.3 g/dL — ABNORMAL LOW (ref 12.0–16.0)
LYMPHS PCT: 21 %
Lymphs Abs: 1.7 10*3/uL (ref 1.0–3.6)
MCH: 23.4 pg — AB (ref 26.0–34.0)
MCHC: 31.2 g/dL — ABNORMAL LOW (ref 32.0–36.0)
MCV: 75.1 fL — ABNORMAL LOW (ref 80.0–100.0)
MONO ABS: 0.7 10*3/uL (ref 0.2–0.9)
Monocytes Relative: 8 %
Neutro Abs: 5.3 10*3/uL (ref 1.4–6.5)
Neutrophils Relative %: 65 %
PLATELETS: 322 10*3/uL (ref 150–440)
RBC: 4.41 MIL/uL (ref 3.80–5.20)
RDW: 20.5 % — AB (ref 11.5–14.5)
WBC: 8.1 10*3/uL (ref 3.6–11.0)

## 2015-08-12 LAB — URINALYSIS COMPLETE WITH MICROSCOPIC (ARMC ONLY)
BILIRUBIN URINE: NEGATIVE
GLUCOSE, UA: NEGATIVE mg/dL
Nitrite: NEGATIVE
Protein, ur: NEGATIVE mg/dL
Specific Gravity, Urine: 1.006 (ref 1.005–1.030)
pH: 7 (ref 5.0–8.0)

## 2015-08-12 LAB — LIPASE, BLOOD: Lipase: 27 U/L (ref 11–51)

## 2015-08-12 MED ORDER — LORAZEPAM 2 MG/ML IJ SOLN
INTRAMUSCULAR | Status: AC
  Administered 2015-08-12: 2 mg via INTRAVENOUS
  Filled 2015-08-12: qty 1

## 2015-08-12 MED ORDER — SODIUM CHLORIDE 0.9 % IV BOLUS (SEPSIS)
1000.0000 mL | Freq: Once | INTRAVENOUS | Status: AC
  Administered 2015-08-12: 1000 mL via INTRAVENOUS

## 2015-08-12 MED ORDER — DIPHENHYDRAMINE HCL 50 MG/ML IJ SOLN
25.0000 mg | Freq: Once | INTRAMUSCULAR | Status: AC
  Administered 2015-08-12: 25 mg via INTRAVENOUS
  Filled 2015-08-12: qty 1

## 2015-08-12 MED ORDER — LORAZEPAM 2 MG/ML IJ SOLN
2.0000 mg | Freq: Once | INTRAMUSCULAR | Status: AC
  Administered 2015-08-12: 2 mg via INTRAVENOUS

## 2015-08-12 MED ORDER — METOCLOPRAMIDE HCL 10 MG PO TABS: 10.0000 mg | ORAL_TABLET | Freq: Three times a day (TID) | ORAL | Status: DC

## 2015-08-12 MED ORDER — KETOROLAC TROMETHAMINE 10 MG PO TABS: 10.0000 mg | ORAL_TABLET | Freq: Four times a day (QID) | ORAL | Status: DC | PRN

## 2015-08-12 MED ORDER — KETOROLAC TROMETHAMINE 30 MG/ML IJ SOLN
30.0000 mg | Freq: Once | INTRAMUSCULAR | Status: AC
  Administered 2015-08-12: 30 mg via INTRAVENOUS
  Filled 2015-08-12: qty 1

## 2015-08-12 MED ORDER — METOCLOPRAMIDE HCL 5 MG/ML IJ SOLN
10.0000 mg | Freq: Once | INTRAMUSCULAR | Status: AC
  Administered 2015-08-12: 10 mg via INTRAVENOUS
  Filled 2015-08-12: qty 2

## 2015-08-12 MED ORDER — DIPHENHYDRAMINE HCL 25 MG PO CAPS: 50.0000 mg | ORAL_CAPSULE | Freq: Four times a day (QID) | ORAL | Status: DC | PRN

## 2015-08-12 NOTE — ED Notes (Signed)
Patient returned from MRI.

## 2015-08-12 NOTE — Discharge Instructions (Signed)
General Headache Without Cause °A headache is pain or discomfort felt around the head or neck area. The specific cause of a headache may not be found. There are many causes and types of headaches. A few common ones are: °· Tension headaches. °· Migraine headaches. °· Cluster headaches. °· Chronic daily headaches. °HOME CARE INSTRUCTIONS  °Watch your condition for any changes. Take these steps to help with your condition: °Managing Pain °· Take over-the-counter and prescription medicines only as told by your health care provider. °· Lie down in a dark, quiet room when you have a headache. °· If directed, apply ice to the head and neck area: °· Put ice in a plastic bag. °· Place a towel between your skin and the bag. °· Leave the ice on for 20 minutes, 2-3 times per day. °· Use a heating pad or hot shower to apply heat to the head and neck area as told by your health care provider. °· Keep lights dim if bright lights bother you or make your headaches worse. °Eating and Drinking °· Eat meals on a regular schedule. °· Limit alcohol use. °· Decrease the amount of caffeine you drink, or stop drinking caffeine. °General Instructions °· Keep all follow-up visits as told by your health care provider. This is important. °· Keep a headache journal to help find out what may trigger your headaches. For example, write down: °· What you eat and drink. °· How much sleep you get. °· Any change to your diet or medicines. °· Try massage or other relaxation techniques. °· Limit stress. °· Sit up straight, and do not tense your muscles. °· Do not use tobacco products, including cigarettes, chewing tobacco, or e-cigarettes. If you need help quitting, ask your health care provider. °· Exercise regularly as told by your health care provider. °· Sleep on a regular schedule. Get 7-9 hours of sleep, or the amount recommended by your health care provider. °SEEK MEDICAL CARE IF:  °· Your symptoms are not helped by medicine. °· You have a  headache that is different from the usual headache. °· You have nausea or you vomit. °· You have a fever. °SEEK IMMEDIATE MEDICAL CARE IF:  °· Your headache becomes severe. °· You have repeated vomiting. °· You have a stiff neck. °· You have a loss of vision. °· You have problems with speech. °· You have pain in the eye or ear. °· You have muscular weakness or loss of muscle control. °· You lose your balance or have trouble walking. °· You feel faint or pass out. °· You have confusion. °  °This information is not intended to replace advice given to you by your health care provider. Make sure you discuss any questions you have with your health care provider. °  °Document Released: 06/11/2005 Document Revised: 03/02/2015 Document Reviewed: 10/04/2014 °Elsevier Interactive Patient Education ©2016 Elsevier Inc. ° °Paresthesia °Paresthesia is an abnormal burning or prickling sensation. This sensation is generally felt in the hands, arms, legs, or feet. However, it may occur in any part of the body. Usually, it is not painful. The feeling may be described as: °· Tingling or numbness. °· Pins and needles. °· Skin crawling. °· Buzzing. °· Limbs falling asleep. °· Itching. °Most people experience temporary (transient) paresthesia at some time in their lives. Paresthesia may occur when you breathe too quickly (hyperventilation). It can also occur without any apparent cause. Commonly, paresthesia occurs when pressure is placed on a nerve. The sensation quickly goes away after the   pressure is removed. For some people, however, paresthesia is a long-lasting (chronic) condition that is caused by an underlying disorder. If you continue to have paresthesia, you may need further medical evaluation. °HOME CARE INSTRUCTIONS °Watch your condition for any changes. Taking the following actions may help to lessen any discomfort that you are feeling: °· Avoid drinking alcohol. °· Try acupuncture or massage to help relieve your  symptoms. °· Keep all follow-up visits as directed by your health care provider. This is important. °SEEK MEDICAL CARE IF: °· You continue to have episodes of paresthesia. °· Your burning or prickling feeling gets worse when you walk. °· You have pain, cramps, or dizziness. °· You develop a rash. °SEEK IMMEDIATE MEDICAL CARE IF: °· You feel weak. °· You have trouble walking or moving. °· You have problems with speech, understanding, or vision. °· You feel confused. °· You cannot control your bladder or bowel movements. °· You have numbness after an injury. °· You faint. °  °This information is not intended to replace advice given to you by your health care provider. Make sure you discuss any questions you have with your health care provider. °  °Document Released: 06/01/2002 Document Revised: 10/26/2014 Document Reviewed: 06/07/2014 °Elsevier Interactive Patient Education ©2016 Elsevier Inc. ° °

## 2015-08-12 NOTE — ED Notes (Signed)
Pt arrived to ED from home reporting a headache that began suddenly three days ago. Pt reports home medications have not decreased pain. Beginning 24 hours ago pt reports right arm numbness and tingling that radiates into right shoulder. Pt reports increased pain with touch and movement of right arm. Pt is 8 days post partum after a c-section delivery that had no complications throughout delivery or throughout pregnancy. This is pts 2nd baby. C-section site appears clean and closed. No signs of infection. Pt has hx of migraines.

## 2015-08-12 NOTE — ED Provider Notes (Signed)
[facility] Emergency Department Provider Note  ____________________________________________  Time seen: 3:00 PM  I have reviewed the triage vital signs and the nursing notes.   HISTORY  Chief Complaint Headache and Numbness    HPI Amber [name] is a 35 y.o. female who complains of sudden onset of headache 3 days ago while at rest. Headache is been constant since then. It's on left side parietal. No trauma. No neck stiffness fever chills vision changes or hearing changes. It is moderate in intensity. He has a history of migraines but feels that this is different.  She is a days postpartum after a scheduled C-section from an otherwise uncomplicated pregnancy and delivery.     Past Medical History  Diagnosis Date  . Anxiety   . Obesity (BMI 30-39.9)   . OSA (obstructive sleep apnea)      Patient Active Problem List   Diagnosis Date Noted  . Postpartum care following cesarean delivery 08/04/2015  . Tobacco quit date established 02/02/2015  . Congenital malrotation of intestine 01/08/2011  . Obesity (BMI 30-39.9)      Past Surgical History  Procedure Laterality Date  . Breast reduction surgery  12/18/02  . Nasal sinus surgery  05/26/03  . Cesarean section    . Laparoscopic cholecystectomy w/ cholangiography  01/22/11  . Tubal ligation    . Cesarean section with bilateral tubal ligation N/A 08/04/2015    Procedure: CESAREAN SECTION WITH BILATERAL TUBAL LIGATION;  Surgeon: Nadara Mustard, MD;  Location: [facility] ORS;  Service: Obstetrics;  Laterality: N/A;     Current Outpatient Rx  Name  Route  Sig  Dispense  Refill  . buPROPion (WELLBUTRIN SR) 150 MG 12 hr tablet   Oral   Take 1 tablet (150 mg total) by mouth daily. For 1 week then can increase to BID   60 tablet   1   . ferrous fumarate (HEMOCYTE - 106 MG FE) 325 (106 Fe) MG TABS tablet   Oral   Take 1 tablet (106 mg of iron total) by mouth daily.   30 each   0   . ibuprofen  (ADVIL,MOTRIN) 600 MG tablet   Oral   Take 1 tablet (600 mg total) by mouth every 6 (six) hours as needed for mild pain, moderate pain or cramping.   30 tablet   0   . oxyCODONE-acetaminophen (PERCOCET/ROXICET) 5-325 MG tablet   Oral   Take 1-2 tablets by mouth every 4 (four) hours as needed (pain scale 4-7).   30 tablet   0   . Prenatal Vit-Fe Fumarate-FA (MULTIVITAMIN-PRENATAL) 27-0.8 MG TABS tablet   Oral   Take 1 tablet by mouth daily at 12 noon. Reported on 06/16/2015         . diphenhydrAMINE (BENADRYL) 25 mg capsule   Oral   Take 2 capsules (50 mg total) by mouth every 6 (six) hours as needed.   60 capsule   0   . ketorolac (TORADOL) 10 MG tablet   Oral   Take 1 tablet (10 mg total) by mouth every 6 (six) hours as needed for moderate pain.   12 tablet   0   . metoCLOPramide (REGLAN) 10 MG tablet   Oral   Take 1 tablet (10 mg total) by mouth 4 (four) times daily -  before meals and at bedtime.   60 tablet   0      Allergies Penicillins and Sulfa antibiotics   Family History  Problem Relation Age  of Onset  . Hypertension Mother     Social History Social History  Substance Use Topics  . Smoking status: Former Smoker    Quit date: 01/05/2003  . Smokeless tobacco: None  . Alcohol Use: No     Comment: "occasionally"    Review of Systems  Constitutional:   No fever or chills. No weight changes Eyes:   No blurry vision or double vision.  ENT:   No sore throat.  Cardiovascular:   No chest pain. Respiratory:   No dyspnea or cough. Gastrointestinal:   Negative for abdominal pain, vomiting and diarrhea.  No BRBPR or melena. Genitourinary:   Negative for dysuria or difficulty urinating. Ongoing vaginal bleeding and discharge postpartum, within expectations. Musculoskeletal:   Negative for back pain. No joint swelling or pain. Skin:   Negative for rash. Neurological:   Positive headache, paresthesia of right upper extremity. No weakness. Psychiatric:   No anxiety or depression.   Endocrine:  No changes in energy or sleep difficulty.  10-point ROS otherwise negative.  ____________________________________________   PHYSICAL EXAM:  VITAL SIGNS: ED Triage Vitals  Enc Vitals Group     BP 08/12/15 1508 130/70 mmHg     Pulse Rate 08/12/15 1508 66     Resp 08/12/15 1508 16     Temp 08/12/15 1508 98.1 F (36.7 C)     Temp Source 08/12/15 1508 Oral     SpO2 08/12/15 1502 99 %     Weight 08/12/15 1508 219 lb (99.338 kg)     Height 08/12/15 1508 5' 1.5" (1.562 m)     Head Cir --      Peak Flow --      Pain Score 08/12/15 1510 7     Pain Loc --      Pain Edu? --      Excl. in GC? --     Vital signs reviewed, nursing assessments reviewed.   Constitutional:   Alert and oriented. Well appearing and in no distress. Eyes:   No scleral icterus. No conjunctival pallor. PERRL. EOMI. No nystagmus ENT   Head:   Normocephalic and atraumatic.   Nose:   No congestion/rhinnorhea. No septal hematoma   Mouth/Throat:   MMM, no pharyngeal erythema. No peritonsillar mass.    Neck:   No stridor. No SubQ emphysema. No meningismus. Hematological/Lymphatic/Immunilogical:   No cervical lymphadenopathy. Cardiovascular:   RRR. Symmetric bilateral radial and DP pulses.  No murmurs.  Respiratory:   Normal respiratory effort without tachypnea nor retractions. Breath sounds are clear and equal bilaterally. No wheezes/rales/rhonchi. Gastrointestinal:   Soft and nontender. Non distended. There is no CVA tenderness.  No rebound, rigidity, or guarding. Genitourinary:   deferred Musculoskeletal:   Nontender with normal range of motion in all extremities. No joint effusions.  No lower extremity tenderness.  No edema. Neurologic:   Normal speech and language.  CN 2-10 normal. Motor grossly intact. No pronator drift. Normal gait No gross focal neurologic deficits are appreciated.  Skin:    Skin is warm, dry and intact. No rash noted.  No petechiae,  purpura, or bullae. Psychiatric:   Mood and affect are normal. No SI or hallucinations ____________________________________________    LABS (pertinent positives/negatives) (all labs ordered are listed, but only abnormal results are displayed) Labs Reviewed  CBC WITH DIFFERENTIAL/PLATELET - Abnormal; Notable for the following:    Hemoglobin 10.3 (*)    HCT 33.1 (*)    MCV 75.1 (*)    MCH 23.4 (*)  MCHC 31.2 (*)    RDW 20.5 (*)    All other components within normal limits  URINALYSIS COMPLETEWITH MICROSCOPIC ([facility] ONLY) - Abnormal; Notable for the following:    Color, Urine YELLOW (*)    APPearance HAZY (*)    Ketones, ur TRACE (*)    Hgb urine dipstick 3+ (*)    Leukocytes, UA 2+ (*)    Bacteria, UA RARE (*)    Squamous Epithelial / LPF 6-30 (*)    All other components within normal limits  COMPREHENSIVE METABOLIC PANEL  LIPASE, BLOOD   ____________________________________________   EKG    ____________________________________________    RADIOLOGY  CT head unremarkable MRI/MRA/MRV brain showed no acute findings  ____________________________________________   PROCEDURES   ____________________________________________   INITIAL IMPRESSION / ASSESSMENT AND PLAN / ED COURSE  Pertinent labs & imaging results that were available during my care of the patient were reviewed by me and considered in my medical decision making (see chart for details).  Patient presents with constant headache for 3 days and postpartum window after C-section. Concern for intracranial thrombosis. Low suspicion for meningitis encephalitis or stroke. No evidence of trauma. We'll proceed with neuro imaging and labs.  ----------------------------------------- 6:52 PM on 08/12/2015 -----------------------------------------  Extensive workup all unremarkable. At this point her symptoms are somewhat vague but do not appear to be anything serious. No evidence of temporal arteritis CRAO/CRVO  or glaucoma.  Low suspicion for intracranial hypertension. She does have a history of migraines and although this is not consistent with her usual migraine pattern at this point will treat with Reglan and Toradol and Benadryl and have her follow up with primary care. No protein on urinalysis, vital signs are unremarkable.     ____________________________________________   FINAL CLINICAL IMPRESSION(S) / ED DIAGNOSES  Final diagnoses:  Right-sided headache  RUE numbness  Right-sided headache  RUE numbness      Sharman Cheek, MD 08/12/15 [id number]

## 2015-08-12 NOTE — ED Notes (Signed)
Patient transported to MRI 

## 2015-09-14 LAB — HM PAP SMEAR

## 2015-11-28 ENCOUNTER — Ambulatory Visit: Payer: 59 | Admitting: Family Medicine

## 2015-11-29 ENCOUNTER — Ambulatory Visit (INDEPENDENT_AMBULATORY_CARE_PROVIDER_SITE_OTHER): Payer: 59 | Admitting: Family Medicine

## 2015-11-29 ENCOUNTER — Encounter: Payer: Self-pay | Admitting: Family Medicine

## 2015-11-29 VITALS — BP 123/60 | HR 84 | Temp 98.6°F | Resp 17 | Ht 62.0 in | Wt 213.7 lb

## 2015-11-29 DIAGNOSIS — IMO0001 Reserved for inherently not codable concepts without codable children: Secondary | ICD-10-CM

## 2015-11-29 MED ORDER — LORCASERIN HCL 10 MG PO TABS: 1.0000 | ORAL_TABLET | Freq: Two times a day (BID) | ORAL | Status: DC

## 2015-11-29 NOTE — Progress Notes (Signed)
Name: Amber [name]   MRN: [id number]    DOB: 05/21/1981   Date:11/29/2015       Progress Note  Subjective  Chief Complaint  Chief Complaint  Patient presents with  . Follow-up    discuss weight     HPI  Obesity: Pt. Presents for discussion about weight loss. She reports that she has always 'struggled' with weight and wants to know if she can be started on an 'appetite suppressant'. Her job has changed to a more sedentary position where she is seated most of the day. Dietary choices vary (eggs in morning, snacks, lunch consists of fast food or [employer], dinner is mainly decided by who has time to cook). She is physically active and tries to walk for 20 minutes every evening.  In the past, she has been on Adipex and Belviq, which caused her to have headaches.   Past Medical History  Diagnosis Date  . Anxiety   . Obesity (BMI 30-39.9)   . OSA (obstructive sleep apnea)     Past Surgical History  Procedure Laterality Date  . Breast reduction surgery  12/18/02  . Nasal sinus surgery  05/26/03  . Cesarean section    . Laparoscopic cholecystectomy w/ cholangiography  01/22/11  . Tubal ligation    . Cesarean section with bilateral tubal ligation N/A 08/04/2015    Procedure: CESAREAN SECTION WITH BILATERAL TUBAL LIGATION;  Surgeon: Nadara Mustard, MD;  Location: [facility] ORS;  Service: Obstetrics;  Laterality: N/A;    Family History  Problem Relation Age of Onset  . Hypertension Mother     Social History   Social History  . Marital Status: Married    Spouse Name: N/A  . Number of Children: N/A  . Years of Education: N/A   Occupational History  . Not on file.   Social History Main Topics  . Smoking status: Former Smoker    Quit date: 01/05/2003  . Smokeless tobacco: Not on file  . Alcohol Use: No     Comment: "occasionally"  . Drug Use: No  . Sexual Activity: Not on file   Other Topics Concern  . Not on file   Social History Narrative     Current outpatient  prescriptions:  .  Prenatal Vit-Fe Fumarate-FA (MULTIVITAMIN-PRENATAL) 27-0.8 MG TABS tablet, Take 1 tablet by mouth daily at 12 noon. Reported on 11/29/2015, Disp: , Rfl:   Allergies  Allergen Reactions  . Penicillins Hives and Swelling  . Sulfa Antibiotics Hives and Swelling     Review of Systems  Constitutional: Positive for malaise/fatigue. Negative for fever and chills.  Cardiovascular: Negative for chest pain.  Gastrointestinal: Negative for abdominal pain.  Neurological: Positive for headaches (occasional headaches).     Objective  Filed Vitals:   11/29/15 1544  BP: 123/60  Pulse: 84  Temp: 98.6 F (37 C)  TempSrc: Oral  Resp: 17  Height:  (1.575 m)  Weight: 213 lb 11.2 oz (96.934 kg)  SpO2: 98%    Physical Exam  Constitutional: She is oriented to person, place, and time and well-developed, well-nourished, and in no distress.  HENT:  Head: Normocephalic and atraumatic.  Cardiovascular: Normal rate, regular rhythm, S1 normal and S2 normal.   No murmur heard. Pulmonary/Chest: Effort normal and breath sounds normal.  Abdominal: Soft. Bowel sounds are normal.  Neurological: She is alert and oriented to person, place, and time.  Psychiatric: Mood, memory, affect and judgment normal.  Nursing note and vitals reviewed.  Assessment & Plan  1. Obesity, Class II, BMI 35-39.9, with comorbidity (HCC) We will start on Belviq and milligram twice daily, advised on concurrent dietary and lifestyle interventions to achieve and maintain a healthy and appropriate body weight. Educated on potential adverse effects with Belviq. Reheck in 3 months. - Lorcaserin HCl (BELVIQ) 10 MG TABS; Take 1 tablet by mouth 2 (two) times daily.  Dispense: 60 tablet; Refill: 2   Rishi Vicario Asad A. Faylene KurtzShah [facility] 11/29/2015 4:18 PM

## 2016-02-29 ENCOUNTER — Ambulatory Visit: Payer: 59 | Admitting: Family Medicine

## 2016-03-07 ENCOUNTER — Emergency Department: Payer: 59

## 2016-03-07 ENCOUNTER — Inpatient Hospital Stay
Admission: EM | Admit: 2016-03-07 | Discharge: 2016-03-09 | DRG: 603 | Disposition: A | Discharge status: Home / Self Care | Payer: 59 | Attending: Internal Medicine | Admitting: Internal Medicine

## 2016-03-07 ENCOUNTER — Encounter: Payer: Self-pay | Admitting: Intensive Care

## 2016-03-07 DIAGNOSIS — E876 Hypokalemia: Secondary | ICD-10-CM | POA: Diagnosis present

## 2016-03-07 DIAGNOSIS — Z880 Allergy status to penicillin: Secondary | ICD-10-CM

## 2016-03-07 DIAGNOSIS — Z9851 Tubal ligation status: Secondary | ICD-10-CM

## 2016-03-07 DIAGNOSIS — Z882 Allergy status to sulfonamides status: Secondary | ICD-10-CM | POA: Diagnosis not present

## 2016-03-07 DIAGNOSIS — Z79899 Other long term (current) drug therapy: Secondary | ICD-10-CM | POA: Diagnosis not present

## 2016-03-07 DIAGNOSIS — Z9889 Other specified postprocedural states: Secondary | ICD-10-CM | POA: Diagnosis not present

## 2016-03-07 DIAGNOSIS — E669 Obesity, unspecified: Secondary | ICD-10-CM | POA: Diagnosis present

## 2016-03-07 DIAGNOSIS — Z9049 Acquired absence of other specified parts of digestive tract: Secondary | ICD-10-CM

## 2016-03-07 DIAGNOSIS — Z8249 Family history of ischemic heart disease and other diseases of the circulatory system: Secondary | ICD-10-CM

## 2016-03-07 DIAGNOSIS — L03211 Cellulitis of face: Principal | ICD-10-CM | POA: Diagnosis present

## 2016-03-07 DIAGNOSIS — K047 Periapical abscess without sinus: Secondary | ICD-10-CM | POA: Diagnosis present

## 2016-03-07 DIAGNOSIS — Z87891 Personal history of nicotine dependence: Secondary | ICD-10-CM

## 2016-03-07 DIAGNOSIS — G4733 Obstructive sleep apnea (adult) (pediatric): Secondary | ICD-10-CM | POA: Diagnosis present

## 2016-03-07 DIAGNOSIS — Z6835 Body mass index (BMI) 35.0-35.9, adult: Secondary | ICD-10-CM | POA: Diagnosis not present

## 2016-03-07 LAB — CBC
HEMATOCRIT: 33.8 % — AB (ref 35.0–47.0)
HEMOGLOBIN: 10.9 g/dL — AB (ref 12.0–16.0)
MCH: 23.3 pg — AB (ref 26.0–34.0)
MCHC: 32.4 g/dL (ref 32.0–36.0)
MCV: 72 fL — AB (ref 80.0–100.0)
Platelets: 199 10*3/uL (ref 150–440)
RBC: 4.7 MIL/uL (ref 3.80–5.20)
RDW: 17.8 % — ABNORMAL HIGH (ref 11.5–14.5)
WBC: 9.2 10*3/uL (ref 3.6–11.0)

## 2016-03-07 LAB — COMPREHENSIVE METABOLIC PANEL
ALBUMIN: 4.1 g/dL (ref 3.5–5.0)
ALK PHOS: 43 U/L (ref 38–126)
ALT: 14 U/L (ref 14–54)
ANION GAP: 9 (ref 5–15)
AST: 13 U/L — ABNORMAL LOW (ref 15–41)
BILIRUBIN TOTAL: 0.8 mg/dL (ref 0.3–1.2)
BUN: 6 mg/dL (ref 6–20)
CALCIUM: 8.7 mg/dL — AB (ref 8.9–10.3)
CO2: 26 mmol/L (ref 22–32)
Chloride: 103 mmol/L (ref 101–111)
Creatinine, Ser: 0.62 mg/dL (ref 0.44–1.00)
GFR calc non Af Amer: >60 mL/min (ref >60)
GLUCOSE: 100 mg/dL — AB (ref 65–99)
Potassium: 3.4 mmol/L — ABNORMAL LOW (ref 3.5–5.1)
Sodium: 138 mmol/L (ref 135–145)
TOTAL PROTEIN: 7.3 g/dL (ref 6.5–8.1)

## 2016-03-07 LAB — POCT PREGNANCY, URINE: Preg Test, Ur: NEGATIVE

## 2016-03-07 MED ORDER — MORPHINE SULFATE (PF) 4 MG/ML IV SOLN
4.0000 mg | Freq: Once | INTRAVENOUS | Status: AC
  Administered 2016-03-07: 4 mg via INTRAVENOUS
  Filled 2016-03-07: qty 1

## 2016-03-07 MED ORDER — KETOROLAC TROMETHAMINE 30 MG/ML IJ SOLN
30.0000 mg | Freq: Four times a day (QID) | INTRAMUSCULAR | Status: DC | PRN
  Administered 2016-03-07 – 2016-03-08 (×5): 30 mg via INTRAVENOUS
  Filled 2016-03-07 (×5): qty 1

## 2016-03-07 MED ORDER — SODIUM CHLORIDE 0.9 % IV BOLUS (SEPSIS)
1000.0000 mL | Freq: Once | INTRAVENOUS | Status: AC
  Administered 2016-03-07: 1000 mL via INTRAVENOUS

## 2016-03-07 MED ORDER — IOPAMIDOL (ISOVUE-300) INJECTION 61%
75.0000 mL | Freq: Once | INTRAVENOUS | Status: AC | PRN
  Administered 2016-03-07: 75 mL via INTRAVENOUS

## 2016-03-07 MED ORDER — ENOXAPARIN SODIUM 40 MG/0.4ML ~~LOC~~ SOLN
40.0000 mg | SUBCUTANEOUS | Status: DC
  Administered 2016-03-07 – 2016-03-08 (×2): 40 mg via SUBCUTANEOUS
  Filled 2016-03-07 (×2): qty 0.4

## 2016-03-07 MED ORDER — LORCASERIN HCL 10 MG PO TABS: 1.0000 | ORAL_TABLET | Freq: Two times a day (BID) | ORAL | Status: DC

## 2016-03-07 MED ORDER — ONDANSETRON HCL 4 MG/2ML IJ SOLN
4.0000 mg | Freq: Four times a day (QID) | INTRAMUSCULAR | Status: DC | PRN
  Administered 2016-03-08: 4 mg via INTRAVENOUS
  Filled 2016-03-07: qty 2

## 2016-03-07 MED ORDER — ONDANSETRON HCL 4 MG/2ML IJ SOLN
4.0000 mg | Freq: Once | INTRAMUSCULAR | Status: AC
  Administered 2016-03-07: 4 mg via INTRAVENOUS
  Filled 2016-03-07: qty 2

## 2016-03-07 MED ORDER — POTASSIUM CHLORIDE 20 MEQ/15ML (10%) PO SOLN
40.0000 meq | Freq: Once | ORAL | Status: AC
  Administered 2016-03-07: 40 meq via ORAL
  Filled 2016-03-07: qty 30

## 2016-03-07 MED ORDER — PRENATAL PLUS 27-1 MG PO TABS
1.0000 | ORAL_TABLET | Freq: Every day | ORAL | Status: DC
  Administered 2016-03-07 – 2016-03-08 (×2): 1 via ORAL
  Filled 2016-03-07 (×4): qty 1

## 2016-03-07 MED ORDER — MORPHINE SULFATE (PF) 4 MG/ML IV SOLN
4.0000 mg | Freq: Four times a day (QID) | INTRAVENOUS | Status: DC | PRN
  Administered 2016-03-07 – 2016-03-08 (×2): 4 mg via INTRAVENOUS
  Filled 2016-03-07 (×2): qty 1

## 2016-03-07 MED ORDER — DOCUSATE SODIUM 100 MG PO CAPS
100.0000 mg | ORAL_CAPSULE | Freq: Two times a day (BID) | ORAL | Status: DC
  Administered 2016-03-07 – 2016-03-09 (×5): 100 mg via ORAL
  Filled 2016-03-07 (×5): qty 1

## 2016-03-07 MED ORDER — ACETAMINOPHEN 650 MG RE SUPP: 650.0000 mg | Freq: Four times a day (QID) | RECTAL | Status: DC | PRN

## 2016-03-07 MED ORDER — VANCOMYCIN HCL IN DEXTROSE 1-5 GM/200ML-% IV SOLN
1000.0000 mg | Freq: Once | INTRAVENOUS | Status: AC
  Administered 2016-03-07: 1000 mg via INTRAVENOUS
  Filled 2016-03-07: qty 200

## 2016-03-07 MED ORDER — CLINDAMYCIN PHOSPHATE 600 MG/50ML IV SOLN
600.0000 mg | Freq: Three times a day (TID) | INTRAVENOUS | Status: DC
  Administered 2016-03-07 – 2016-03-09 (×6): 600 mg via INTRAVENOUS
  Filled 2016-03-07 (×8): qty 50

## 2016-03-07 MED ORDER — SODIUM CHLORIDE 0.9 % IV SOLN
INTRAVENOUS | Status: DC
  Administered 2016-03-07 – 2016-03-08 (×3): via INTRAVENOUS

## 2016-03-07 MED ORDER — ACETAMINOPHEN 325 MG PO TABS: 650.0000 mg | ORAL_TABLET | Freq: Four times a day (QID) | ORAL | Status: DC | PRN

## 2016-03-07 MED ORDER — ONDANSETRON HCL 4 MG PO TABS: 4.0000 mg | ORAL_TABLET | Freq: Four times a day (QID) | ORAL | Status: DC | PRN

## 2016-03-07 MED ORDER — IBUPROFEN 400 MG PO TABS: 400.0000 mg | ORAL_TABLET | Freq: Four times a day (QID) | ORAL | Status: DC | PRN

## 2016-03-07 NOTE — H&P (Signed)
[facility] [place] [facility]   PATIENT NAME: Amber AstWendy Dede    MR#:  [id number]  DATE OF BIRTH:  Oct 15, 1980  DATE OF ADMISSION:  03/07/2016  PRIMARY CARE PHYSICIAN: Dennison MascotLemont Morrisey, MD   REQUESTING/REFERRING PHYSICIAN: Dr Lenard Lancepaduchowski  CHIEF COMPLAINT:   Facial swelling HISTORY OF PRESENT ILLNESS:  Amber [name]  is a 35 y.o. female with a known history of Obesity who presents with facial swelling on the left side. Patient reports over the past several days she is has been experiencing left-sided facial pain with swelling and redness. She initially thought this was a sinus infection. She denies any tooth pain. She saw her doctor a few days ago and she was prescribed doxycycline and since then the swelling worsened and her doctor then prescribe clindamycin. This morning the swelling continues to increase in the pain is 8 out of 10 so she presents to the ER. In the emergency room facial CT scan is performed which shows dental root abscess as the etiology of her facial cellulitis. She was given a dose of vancomycin.  PAST MEDICAL HISTORY:   Past Medical History:  Diagnosis Date  . Anxiety   . Obesity (BMI 30-39.9)   . OSA (obstructive sleep apnea)     PAST SURGICAL HISTORY:   Past Surgical History:  Procedure Laterality Date  . BREAST REDUCTION SURGERY  12/18/02  . CESAREAN SECTION    . CESAREAN SECTION WITH BILATERAL TUBAL LIGATION N/A 08/04/2015   Procedure: CESAREAN SECTION WITH BILATERAL TUBAL LIGATION;  Surgeon: Nadara Mustardobert P Harris, MD;  Location: [facility] ORS;  Service: Obstetrics;  Laterality: N/A;  . LAPAROSCOPIC CHOLECYSTECTOMY W/ CHOLANGIOGRAPHY  01/22/11  . NASAL SINUS SURGERY  05/26/03  . TUBAL LIGATION      SOCIAL HISTORY:   Social History  Substance Use Topics  . Smoking status: Former Smoker    Quit date: 01/05/2003  . Smokeless tobacco: Never Used  . Alcohol use No     Comment: "occasionally"    FAMILY HISTORY:   Family History  Problem Relation  Age of Onset  . Hypertension Mother     DRUG ALLERGIES:   Allergies  Allergen Reactions  . Penicillins Hives and Swelling  . Sulfa Antibiotics Hives and Swelling    REVIEW OF SYSTEMS:   Review of Systems  Constitutional: Positive for chills and malaise/fatigue. Negative for fever.  HENT: Negative for ear discharge, ear pain, hearing loss, nosebleeds and sore throat.   Eyes: Negative.  Negative for blurred vision and pain.  Respiratory: Negative.  Negative for cough, hemoptysis, shortness of breath and wheezing.   Cardiovascular: Negative.  Negative for chest pain, palpitations and leg swelling.  Gastrointestinal: Negative.  Negative for abdominal pain, blood in stool, diarrhea, nausea and vomiting.  Genitourinary: Negative.  Negative for dysuria.  Musculoskeletal: Negative.  Negative for back pain.  Skin: Negative.        Facial redness, swelling and pain  Neurological: Positive for headaches. Negative for dizziness, tremors, speech change, focal weakness and seizures.  Endo/Heme/Allergies: Negative.  Does not bruise/bleed easily.  Psychiatric/Behavioral: Negative.  Negative for depression, hallucinations and suicidal ideas.    MEDICATIONS AT HOME:   Prior to Admission medications   Medication Sig Start Date End Date Taking? Authorizing Provider  ibuprofen (ADVIL,MOTRIN) 200 MG tablet Take 800 mg by mouth every 6 (six) hours as needed.   Yes Historical Provider, MD  Prenatal Vit-Fe Fumarate-FA (MULTIVITAMIN-PRENATAL) 27-0.8 MG TABS tablet Take 1 tablet by mouth daily at 12  noon. Reported on 11/29/2015   Yes Historical Provider, MD  sertraline (ZOLOFT) 50 MG tablet Take 50 mg by mouth daily.   Yes Historical Provider, MD  Lorcaserin HCl (BELVIQ) 10 MG TABS Take 1 tablet by mouth 2 (two) times daily. Patient not taking: Reported on 03/07/2016 11/29/15   Ellyn Hack, MD      VITAL SIGNS:  Blood pressure 132/64, pulse 91, temperature 98.3 F (36.8 C), temperature source Oral,  resp. rate 17, height 5' 1.5" (1.562 m), weight 97.5 kg (215 lb), last menstrual period 01/29/2016, SpO2 99 %, not currently breastfeeding.  PHYSICAL EXAMINATION:   Physical Exam  Constitutional: She is oriented to person, place, and time and well-developed, well-nourished, and in no distress. No distress.  HENT:  Head: Normocephalic.  Eyes: No scleral icterus.  Neck: Normal range of motion. Neck supple. No JVD present. No tracheal deviation present.  Cardiovascular: Normal rate, regular rhythm and normal heart sounds.  Exam reveals no gallop and no friction rub.   No murmur heard. Pulmonary/Chest: Effort normal and breath sounds normal. No respiratory distress. She has no wheezes. She has no rales. She exhibits no tenderness.  Abdominal: Soft. Bowel sounds are normal. She exhibits no distension and no mass. There is no tenderness. There is no rebound and no guarding.  Musculoskeletal: Normal range of motion. She exhibits no edema.  Neurological: She is alert and oriented to person, place, and time.  Skin: Skin is warm. No rash noted. No erythema.  Left facial erythema with a significant amount of swelling around left eye Visual acuity is intact  Psychiatric: Affect and judgment normal.      LABORATORY PANEL:   CBC  Recent Labs Lab 03/07/16 0744  WBC 9.2  HGB 10.9*  HCT 33.8*  PLT 199   ------------------------------------------------------------------------------------------------------------------  Chemistries   Recent Labs Lab 03/07/16 0744  NA 138  K 3.4*  CL 103  CO2 26  GLUCOSE 100*  BUN 6  CREATININE 0.62  CALCIUM 8.7*  AST 13*  ALT 14  ALKPHOS 43  BILITOT 0.8   ------------------------------------------------------------------------------------------------------------------  Cardiac Enzymes No results for input(s): TROPONINI in the last 168  hours. ------------------------------------------------------------------------------------------------------------------  RADIOLOGY:  Ct Maxillofacial W Contrast  Result Date: 03/07/2016 CLINICAL DATA:  Left facial swelling, cellulitis, on doxycycline and clindamycin EXAM: CT MAXILLOFACIAL WITH CONTRAST TECHNIQUE: Multidetector CT imaging of the maxillofacial structures was performed with intravenous contrast. Multiplanar CT image reconstructions were also generated. A small metallic BB was placed on the right temple in order to reliably differentiate right from left. CONTRAST:  75 mL Isovue 300 IV COMPARISON:  Head CT dated 08/12/2015 FINDINGS: Apical root abscess involving the left upper lateral incisor (tooth #10; series 3/ image 52). Overlying soft tissue swelling/ phlegmonous change anterior to the left maxilla (series 2/image 48), without discrete/drainable fluid collection. Soft tissue swelling/cellulitis overlying the left orbit (series 2/ image 32), maxilla/zygoma (series 2/ image 40), and extending inferiorly along the left mandible/cheek (series 2/ image 73). Small reactive lymph nodes measuring up to 7 mm short axis (series 2/image 39), left greater than right. Near complete opacification of the left maxillary sinus. Visualized paranasal sinuses are otherwise clear. Visualized mastoid air cells are essentially clear. No evidence of maxillofacial fracture.  Mandible is intact. Bilateral globes are intact. retroconal soft tissues are within normal limits. Visualized brain parenchyma is unremarkable. IMPRESSION: Apical root abscess involving the left upper lateral incisor (tooth #10). Overlying phlegmonous change without discrete fluid collection/abscess. Associated cellulitis involving the  left face, as described above. Electronically Signed   By: Charline Bills M.D.   On: 03/07/2016 09:25    EKG:   Orders placed or performed in visit on 03/31/12  . EKG 12-Lead    IMPRESSION AND PLAN:    35 year old female with a history of anxiety who presents with facial cellulitis due to dental root abscess  1. Facial cellulitis: Patient failed outpatient therapy with clindamycin Doxy. Start Unasyn.  2. Hypokalemia: Replete and recheck in a.m.  3. Dental root abscess with phlegmon: Patient will need outpatient dental referral.  All the records are reviewed and case discussed with ED provider. Management plans discussed with the patient and she is in agreement  CODE STATUS: full  TOTAL TIME TAKING CARE OF THIS PATIENT: 45 minutes.    Caedan Sumler M.D on 03/07/2016 at 11:26 AM  Between 7am to 6pm - Pager - [phone]  After 6pm go to [email or web address] - Social research officer, government  [facility]  Office  [phone]  CC: Primary care physician; Dennison Mascot, MD

## 2016-03-07 NOTE — ED Notes (Signed)
Pt with edema to the left side of her face  Pt reports being on abx since Sunday yet symptoms have only worsened  8/10 pain at this time

## 2016-03-07 NOTE — ED Provider Notes (Signed)
[facility] Emergency Department Provider Note  Time seen: 7:44 AM  I have reviewed the triage vital signs and the nursing notes.   HISTORY  Chief Complaint Facial Swelling    HPI Amber [name] is a 35 y.o. female with a past medical history of anxiety, who presents the emergency department with left facial swelling and pain. According to the patient for the past 3-4 days she has been experiencing left-sided facial pain with slight swelling and redness. She saw her doctor who started her on antibiotics Sunday (3-4 days ago), doxycycline. States the swelling worsen so she saw her doctor once again yesterday who started clindamycin as well. She states this morning the left side of her face was more swollen and her eye was significantly swollen shut, so she came to the emergency department for evaluation. Describes 8/10 left facial pain, moderate redness, with moderate swelling. Patient states she has been having pain in her left upper premolar area. Denies any recent dental operations. Denies any vomiting.  Past Medical History:  Diagnosis Date  . Anxiety   . Obesity (BMI 30-39.9)   . OSA (obstructive sleep apnea)     Patient Active Problem List   Diagnosis Date Noted  . Obesity, Class II, BMI 35-39.9, with comorbidity (HCC) 11/29/2015  . Postpartum care following cesarean delivery 08/04/2015  . Tobacco quit date established 02/02/2015  . Congenital malrotation of intestine 01/08/2011  . Obesity (BMI 30-39.9)     Past Surgical History:  Procedure Laterality Date  . BREAST REDUCTION SURGERY  12/18/02  . CESAREAN SECTION    . CESAREAN SECTION WITH BILATERAL TUBAL LIGATION N/A 08/04/2015   Procedure: CESAREAN SECTION WITH BILATERAL TUBAL LIGATION;  Surgeon: Nadara Mustardobert P Harris, MD;  Location: [facility] ORS;  Service: Obstetrics;  Laterality: N/A;  . LAPAROSCOPIC CHOLECYSTECTOMY W/ CHOLANGIOGRAPHY  01/22/11  . NASAL SINUS SURGERY  05/26/03  . TUBAL LIGATION      Prior  to Admission medications   Medication Sig Start Date End Date Taking? Authorizing Provider  Lorcaserin HCl (BELVIQ) 10 MG TABS Take 1 tablet by mouth 2 (two) times daily. 11/29/15   Ellyn HackSyed Asad A Shah, MD  Prenatal Vit-Fe Fumarate-FA (MULTIVITAMIN-PRENATAL) 27-0.8 MG TABS tablet Take 1 tablet by mouth daily at 12 noon. Reported on 11/29/2015    Historical Provider, MD    Allergies  Allergen Reactions  . Penicillins Hives and Swelling  . Sulfa Antibiotics Hives and Swelling    Family History  Problem Relation Age of Onset  . Hypertension Mother     Social History Social History  Substance Use Topics  . Smoking status: Former Smoker    Quit date: 01/05/2003  . Smokeless tobacco: Never Used  . Alcohol use No     Comment: "occasionally"    Review of Systems Constitutional: Negative for fever. Cardiovascular: Negative for chest pain. Respiratory: Negative for shortness of breath. Gastrointestinal: Negative for abdominal pain Musculoskeletal: Negative for back pain. Skin: Left facial redness. Neurological: Mild headache. 10-point ROS otherwise negative.  ____________________________________________   PHYSICAL EXAM:  VITAL SIGNS: ED Triage Vitals  Enc Vitals Group     BP 03/07/16 0731 140/76     Pulse Rate 03/07/16 0731 (!) 109     Resp 03/07/16 0731 20     Temp 03/07/16 0731 98.6 F (37 C)     Temp Source 03/07/16 0731 Oral     SpO2 03/07/16 0731 100 %     Weight 03/07/16 0732 215 lb (97.5 kg)  Height 03/07/16 0732 5' 1.5" (1.562 m)     Head Circumference --      Peak Flow --      Pain Score 03/07/16 0732 8     Pain Loc --      Pain Edu? --      Excl. in GC? --     Constitutional: Alert and oriented. Well appearing and in no distress. Eyes: Normal exam, EOMI. Denies visual changes. ENT   Head: Moderate left facial swelling with moderate left periorbital edema, moderate left sided erythema, with significant tenderness to palpation.   Mouth/Throat: Mucous  membranes are moist. Significant tenderness palpation over the gums of the left upper premolars. No obvious abscess. Cardiovascular: Normal rate, regular rhythm. No murmurs, rubs, or gallops. Respiratory: Normal respiratory effort without tachypnea nor retractions. Breath sounds are clear  Gastrointestinal: Soft and nontender. No distention.   Musculoskeletal: Nontender with normal range of motion in all extremities.  Neurologic:  Normal speech and language. No gross focal neurologic deficits Skin:  Skin is warm, dry and intact.  Psychiatric: Mood and affect are normal.   ____________________________________________  RADIOLOGY  CT consistent with cellulitis with dental root abscess.   ____________________________________________   INITIAL IMPRESSION / ASSESSMENT AND PLAN / ED COURSE  Pertinent labs & imaging results that were available during my care of the patient were reviewed by me and considered in my medical decision making (see chart for details).  Patient presents with left-sided facial swelling pain and tenderness most consistent with left-sided facial cellulitis likely from odontogenic abscess/origin. We will check labs, blood cultures, begin IV vancomycin, obtain a CT scan of the patient's face with contrast. As the patient has had worsening cellulitis despite home antibiotics anticipated likely admission to the hospital for continued IV antibiotics and close monitoring.  CT consistent with cellulitis with dental root abscess, phlegmon and cellulitis of the left face. We'll continue IV antibiotics and admitted to the hospitalist was given failed outpatient antibiotics. ____________________________________________   FINAL CLINICAL IMPRESSION(S) / ED DIAGNOSES  Facial cellulitis    Minna Antis, MD 03/07/16 [id number]

## 2016-03-07 NOTE — Plan of Care (Signed)
Pt resting comfortably in bed.  PRN meds given for pain with relief.  Facial swelling has somewhat improved.  Pt up independently in room.  Pt tolerating regular diet.

## 2016-03-07 NOTE — ED Triage Notes (Signed)
Patient presents to ER with facial swelling on L side. Patient states this started on Friday and diagnosed with cellulitis. Patient was prescribed Doxy and clindamycin. She reports the swelling has gotten worse. Denies difficulty breathing or swallowing. Pts only complaint is pain to face.

## 2016-03-08 LAB — BASIC METABOLIC PANEL
Anion gap: 6 (ref 5–15)
BUN: 8 mg/dL (ref 6–20)
CALCIUM: 8.3 mg/dL — AB (ref 8.9–10.3)
CHLORIDE: 109 mmol/L (ref 101–111)
CO2: 26 mmol/L (ref 22–32)
CREATININE: 0.62 mg/dL (ref 0.44–1.00)
Glucose, Bld: 100 mg/dL — ABNORMAL HIGH (ref 65–99)
Potassium: 3.7 mmol/L (ref 3.5–5.1)
SODIUM: 141 mmol/L (ref 135–145)

## 2016-03-08 LAB — CBC
HEMATOCRIT: 32.1 % — AB (ref 35.0–47.0)
HEMOGLOBIN: 10.4 g/dL — AB (ref 12.0–16.0)
MCH: 23.9 pg — ABNORMAL LOW (ref 26.0–34.0)
MCHC: 32.3 g/dL (ref 32.0–36.0)
MCV: 74 fL — AB (ref 80.0–100.0)
Platelets: 206 10*3/uL (ref 150–440)
RBC: 4.34 MIL/uL (ref 3.80–5.20)
RDW: 17.8 % — AB (ref 11.5–14.5)
WBC: 7.8 10*3/uL (ref 3.6–11.0)

## 2016-03-08 MED ORDER — SERTRALINE HCL 50 MG PO TABS
50.0000 mg | ORAL_TABLET | Freq: Every day | ORAL | Status: DC
  Administered 2016-03-08 – 2016-03-09 (×2): 50 mg via ORAL
  Filled 2016-03-08 (×2): qty 1

## 2016-03-08 MED ORDER — TRAMADOL HCL 50 MG PO TABS: 50.0000 mg | ORAL_TABLET | Freq: Four times a day (QID) | ORAL | 0 refills | Status: DC | PRN

## 2016-03-08 MED ORDER — CLINDAMYCIN HCL 300 MG PO CAPS: 300.0000 mg | ORAL_CAPSULE | Freq: Three times a day (TID) | ORAL | 0 refills | Status: AC

## 2016-03-08 NOTE — Progress Notes (Signed)
[facility] [place] at [facility]   PATIENT NAME: Amber [name]    MR#:  [id number]  DATE OF BIRTH:  08/13/1980  SUBJECTIVE:   Patient voices no complaints. She reports that she does not think she is ready for discharge today. Her swelling and erythema has much improved since admission.yes  REVIEW OF SYSTEMS:    Review of Systems  Constitutional: Negative.  Negative for chills, fever and malaise/fatigue.  HENT: Negative.  Negative for ear discharge, ear pain, hearing loss, nosebleeds and sore throat.   Eyes: Negative.  Negative for blurred vision and pain.  Respiratory: Negative.  Negative for cough, hemoptysis, shortness of breath and wheezing.   Cardiovascular: Negative.  Negative for chest pain, palpitations and leg swelling.  Gastrointestinal: Negative.  Negative for abdominal pain, blood in stool, diarrhea, nausea and vomiting.  Genitourinary: Negative.  Negative for dysuria.  Musculoskeletal: Negative.  Negative for back pain.  Skin: Negative.        Left facial swelling  Neurological: Negative for dizziness, tremors, speech change, focal weakness, seizures and headaches.  Endo/Heme/Allergies: Negative.  Does not bruise/bleed easily.  Psychiatric/Behavioral: Positive for depression. Negative for hallucinations and suicidal ideas.    Tolerating Diet:       DRUG ALLERGIES:   Allergies  Allergen Reactions  . Penicillins Hives and Swelling  . Sulfa Antibiotics Hives and Swelling    VITALS:  Blood pressure 121/71, pulse 75, temperature 98.7 F (37.1 C), temperature source Oral, resp. rate 18, height 5' 1.5" (1.562 m), weight 97.5 kg (214 lb 14.4 oz), last menstrual period 01/29/2016, SpO2 97 %, not currently breastfeeding.  PHYSICAL EXAMINATION:   Physical Exam  Constitutional: She is oriented to person, place, and time and well-developed, well-nourished, and in no distress. No distress.  HENT:  Head: Normocephalic.  Dental abscess left bottom  molar  Eyes: No scleral icterus.  Neck: Normal range of motion. Neck supple. No JVD present. No tracheal deviation present.  Cardiovascular: Normal rate, regular rhythm and normal heart sounds.  Exam reveals no gallop and no friction rub.   No murmur heard. Pulmonary/Chest: Effort normal and breath sounds normal. No respiratory distress. She has no wheezes. She has no rales. She exhibits no tenderness.  Abdominal: Soft. Bowel sounds are normal. She exhibits no distension and no mass. There is no tenderness. There is no rebound and no guarding.  Musculoskeletal: Normal range of motion. She exhibits no edema.  Neurological: She is alert and oriented to person, place, and time.  Skin: Skin is warm. No rash noted. No erythema.  Erythema has much improved. She has no evidence of erythema. Swelling is much improved.  Psychiatric: Affect and judgment normal.      LABORATORY PANEL:   CBC  Recent Labs Lab 03/08/16 0425  WBC 7.8  HGB 10.4*  HCT 32.1*  PLT 206   ------------------------------------------------------------------------------------------------------------------  Chemistries   Recent Labs Lab 03/07/16 0744 03/08/16 0425  NA 138 141  K 3.4* 3.7  CL 103 109  CO2 26 26  GLUCOSE 100* 100*  BUN 6 8  CREATININE 0.62 0.62  CALCIUM 8.7* 8.3*  AST 13*  --   ALT 14  --   ALKPHOS 43  --   BILITOT 0.8  --    ------------------------------------------------------------------------------------------------------------------  Cardiac Enzymes No results for input(s): TROPONINI in the last 168 hours. ------------------------------------------------------------------------------------------------------------------  RADIOLOGY:  Ct Maxillofacial W Contrast  Result Date: 03/07/2016 CLINICAL DATA:  Left facial swelling, cellulitis, on doxycycline and clindamycin EXAM: CT  MAXILLOFACIAL WITH CONTRAST TECHNIQUE: Multidetector CT imaging of the maxillofacial structures was performed  with intravenous contrast. Multiplanar CT image reconstructions were also generated. A small metallic BB was placed on the right temple in order to reliably differentiate right from left. CONTRAST:  75 mL Isovue 300 IV COMPARISON:  Head CT dated 08/12/2015 FINDINGS: Apical root abscess involving the left upper lateral incisor (tooth #10; series 3/ image 52). Overlying soft tissue swelling/ phlegmonous change anterior to the left maxilla (series 2/image 48), without discrete/drainable fluid collection. Soft tissue swelling/cellulitis overlying the left orbit (series 2/ image 32), maxilla/zygoma (series 2/ image 40), and extending inferiorly along the left mandible/cheek (series 2/ image 73). Small reactive lymph nodes measuring up to 7 mm short axis (series 2/image 39), left greater than right. Near complete opacification of the left maxillary sinus. Visualized paranasal sinuses are otherwise clear. Visualized mastoid air cells are essentially clear. No evidence of maxillofacial fracture.  Mandible is intact. Bilateral globes are intact. retroconal soft tissues are within normal limits. Visualized brain parenchyma is unremarkable. IMPRESSION: Apical root abscess involving the left upper lateral incisor (tooth #10). Overlying phlegmonous change without discrete fluid collection/abscess. Associated cellulitis involving the left face, as described above. Electronically Signed   By: Charline BillsSriyesh  Krishnan M.D.   On: 03/07/2016 09:25     ASSESSMENT AND PLAN:   35 year old female with dental root abscess causing facial cellulitis.   1. Facial cellulitis due to dental root abscess with phlegmon: Patient symptoms have much improved. On examination swelling erythema has much improved. She is currently on clindamycin. She is allergic to penicillin and therefore Unasyn was not used. Next line she can be discharged on clindamycin and pain medications. She was advised that she will need to see a dentist on Monday.  2.  Hypokalemia: This was repleted.  3. Dental root abscess: Patient symptoms have improved. She will need to follow-up with dentist on Monday which she says she has her to make an appointment.   Management plans discussed with the patient ansshe is in agreement.  CODE STATUS:  Full  Rounded with nurse today  TOTAL TIME TAKING CARE OF THIS PATIENT:  30 minutes.   Patient medically stable for discharge however she is feeling uncomfortable with discharge due to pain. I discussed that she is advised to see dentist on Monday and she can be discharged with pain medications. Will reevaluate for her comfort for discharge today.  POSSIBLE D/C today, DEPENDING ON CLINICAL CONDITION.   Nelta Caudill M.D on 03/08/2016 at 10:51 AM  Between 7am to 6pm - Pager - [phone] After 6pm go to [email or web address] - password [email or web address] [email or web address]  [facility] [place] [facility]  Office  [phone]  CC: Primary care physician; Dennison MascotLemont Morrisey, MD  Note: This dictation was prepared with Dragon dictation along with smaller phrase technology. Any transcriptional errors that result from this process are unintentional.

## 2016-03-08 NOTE — Plan of Care (Signed)
Problem: Pain Managment: Goal: General experience of comfort will improve Outcome: Progressing Pt continues to c/o pain on left side of face .Tordal given with good results

## 2016-03-08 NOTE — Plan of Care (Signed)
Problem: Pain Managment: Goal: General experience of comfort will improve Outcome: Not Progressing Pt still with intermittent bouts of facial pain. Morphine given once so far this shift. Zofran given 4 mg with improvement.

## 2016-03-09 ENCOUNTER — Telehealth: Payer: Self-pay | Admitting: Family Medicine

## 2016-03-09 NOTE — Progress Notes (Signed)
   [place] [facility] AT [facility] [address] [place], [place] [address]  March 09, 2016  Patient:  Amber AstWendy [name] Date of Birth: [birth year] Date of Visit:  03/07/2016  To Whom it May Concern:  Please excuse Amber AsaWendy M [name] from work from 03/07/2016 until 03/09/16 as she was admitted to the [facility] for medical treatment and has been receiving appropriate care. She may return to work on 03/13/16, sooner if she feels she is able to return sooner than this date.      Please don't hesitate to contact me with questions or concerns by calling  [phone] and asking them to page me directly.   Marge Duncansave Hassel Uphoff, MD

## 2016-03-09 NOTE — Discharge Summary (Signed)
[facility] - [place] at [facility]   PATIENT NAME: Amber [name]    MR#:  [id number]  DATE OF BIRTH:  [birth year]  DATE OF ADMISSION:  03/07/2016 ADMITTING PHYSICIAN: Adrian SaranSital Mody, MD  DATE OF DISCHARGE: 03/09/16  PRIMARY CARE PHYSICIAN: Dennison MascotLemont Morrisey, MD    ADMISSION DIAGNOSIS:  Cellulitis of face [L03.211]  DISCHARGE DIAGNOSIS:  Active Problems:   Facial cellulitis  Tooth abscess  SECONDARY DIAGNOSIS:   Past Medical History:  Diagnosis Date  . Anxiety   . Obesity (BMI 30-39.9)   . OSA (obstructive sleep apnea)     HOSPITAL COURSE:  Amber AstWendy Fiallos  is a 35 y.o. female admitted 03/07/2016 with chief complaint Facial Swelling . Please see H&P performed by Adrian SaranSital Mody, MD for further information. Patient presented with the above symptoms found to have facial cellulitis with marked swelling. Placed on antibiotics with continued improvement.   CT scan results: Apical root abscess involving the left upper lateral incisor (tooth #10). Overlying phlegmonous change without discrete fluid collection/abscess.  DISCHARGE CONDITIONS:   Stable  CONSULTS OBTAINED:    DRUG ALLERGIES:   Allergies  Allergen Reactions  . Penicillins Hives and Swelling  . Sulfa Antibiotics Hives and Swelling    DISCHARGE MEDICATIONS:   Current Discharge Medication List    START taking these medications   Details  clindamycin (CLEOCIN) 300 MG capsule Take 1 capsule (300 mg total) by mouth 3 (three) times daily. Qty: 21 capsule, Refills: 0    traMADol (ULTRAM) 50 MG tablet Take 1 tablet (50 mg total) by mouth every 6 (six) hours as needed. Qty: 30 tablet, Refills: 0      CONTINUE these medications which have NOT CHANGED   Details  ibuprofen (ADVIL,MOTRIN) 200 MG tablet Take 800 mg by mouth every 6 (six) hours as needed.    Prenatal Vit-Fe Fumarate-FA (MULTIVITAMIN-PRENATAL) 27-0.8 MG TABS tablet Take 1 tablet by mouth daily at 12 noon. Reported on 11/29/2015   Associated  Diagnoses: Obesity (BMI 30-39.9)    sertraline (ZOLOFT) 50 MG tablet Take 50 mg by mouth daily.    Lorcaserin HCl (BELVIQ) 10 MG TABS Take 1 tablet by mouth 2 (two) times daily. Qty: 60 tablet, Refills: 2   Associated Diagnoses: Obesity, Class II, BMI 35-39.9, with comorbidity (HCC)         DISCHARGE INSTRUCTIONS:    DIET:  Regular diet  DISCHARGE CONDITION:  Stable  ACTIVITY:  Activity as tolerated  OXYGEN:  Home Oxygen: No.   Oxygen Delivery: room air  DISCHARGE LOCATION:  home   If you experience worsening of your admission symptoms, develop shortness of breath, life threatening emergency, suicidal or homicidal thoughts you must seek medical attention immediately by calling 911 or calling your MD immediately  if symptoms less severe.  You Must read complete instructions/literature along with all the possible adverse reactions/side effects for all the Medicines you take and that have been prescribed to you. Take any new Medicines after you have completely understood and accpet all the possible adverse reactions/side effects.   Please note  You were cared for by a hospitalist during your hospital stay. If you have any questions about your discharge medications or the care you received while you were in the hospital after you are discharged, you can call the unit and asked to speak with the hospitalist on call if the hospitalist that took care of you is not available. Once you are discharged, your primary care physician will handle any further medical  issues. Please note that NO REFILLS for any discharge medications will be authorized once you are discharged, as it is imperative that you return to your primary care physician (or establish a relationship with a primary care physician if you do not have one) for your aftercare needs so that they can reassess your need for medications and monitor your lab values.    On the day of Discharge:   VITAL SIGNS:  Blood pressure  122/60, pulse 69, temperature 98.5 F (36.9 C), temperature source Oral, resp. rate 16, height 5' 1.5" (1.562 m), weight 97.5 kg (214 lb 14.4 oz), last menstrual period 01/29/2016, SpO2 100 %, not currently breastfeeding.  I/O:   Intake/Output Summary (Last 24 hours) at 03/09/16 0837 Last data filed at 03/08/16 2241  Gross per 24 hour  Intake              630 ml  Output                0 ml  Net              630 ml    PHYSICAL EXAMINATION:  GENERAL:  35 y.o.-year-old patient lying in the bed with no acute distress.  EYES: Pupils equal, round, reactive to light and accommodation. No scleral icterus. Extraocular muscles intact.  HEENT: Head atraumatic, normocephalic. Oropharynx and nasopharynx clear.  NECK:  Supple, no jugular venous distention. No thyroid enlargement, no tenderness.  LUNGS: Normal breath sounds bilaterally, no wheezing, rales,rhonchi or crepitation. No use of accessory muscles of respiration.  CARDIOVASCULAR: S1, S2 normal. No murmurs, rubs, or gallops.  ABDOMEN: Soft, non-tender, non-distended. Bowel sounds present. No organomegaly or mass.  EXTREMITIES: No pedal edema, cyanosis, or clubbing.  NEUROLOGIC: Cranial nerves II through XII are intact. Muscle strength 5/5 in all extremities. Sensation intact. Gait not checked.  PSYCHIATRIC: The patient is alert and oriented x 3.  SKIN: No obvious rash, lesion, or ulcer.   DATA REVIEW:   CBC  Recent Labs Lab 03/08/16 0425  WBC 7.8  HGB 10.4*  HCT 32.1*  PLT 206    Chemistries   Recent Labs Lab 03/07/16 0744 03/08/16 0425  NA 138 141  K 3.4* 3.7  CL 103 109  CO2 26 26  GLUCOSE 100* 100*  BUN 6 8  CREATININE 0.62 0.62  CALCIUM 8.7* 8.3*  AST 13*  --   ALT 14  --   ALKPHOS 43  --   BILITOT 0.8  --     Cardiac Enzymes No results for input(s): TROPONINI in the last 168 hours.  Microbiology Results  Results for orders placed or performed during the hospital encounter of 03/07/16  Blood culture  (routine x 2)     Status: None (Preliminary result)   Collection Time: 03/07/16  7:44 AM  Result Value Ref Range Status   Specimen Description BLOOD LEFT ARM  Final   Special Requests BOTTLES DRAWN AEROBIC AND ANAEROBIC 7CC  Final   Culture NO GROWTH < 24 HOURS  Final   Report Status PENDING  Incomplete  Blood culture (routine x 2)     Status: None (Preliminary result)   Collection Time: 03/07/16  8:27 AM  Result Value Ref Range Status   Specimen Description BLOOD LEFT HAND  Final   Special Requests   Final    BOTTLES DRAWN AEROBIC AND ANAEROBIC AER ANA   Culture NO GROWTH < 24 HOURS  Final   Report Status PENDING  Incomplete  RADIOLOGY:  Ct Maxillofacial W Contrast  Result Date: 03/07/2016 CLINICAL DATA:  Left facial swelling, cellulitis, on doxycycline and clindamycin EXAM: CT MAXILLOFACIAL WITH CONTRAST TECHNIQUE: Multidetector CT imaging of the maxillofacial structures was performed with intravenous contrast. Multiplanar CT image reconstructions were also generated. A small metallic BB was placed on the right temple in order to reliably differentiate right from left. CONTRAST:  75 mL Isovue 300 IV COMPARISON:  Head CT dated 08/12/2015 FINDINGS: Apical root abscess involving the left upper lateral incisor (tooth #10; series 3/ image 52). Overlying soft tissue swelling/ phlegmonous change anterior to the left maxilla (series 2/image 48), without discrete/drainable fluid collection. Soft tissue swelling/cellulitis overlying the left orbit (series 2/ image 32), maxilla/zygoma (series 2/ image 40), and extending inferiorly along the left mandible/cheek (series 2/ image 73). Small reactive lymph nodes measuring up to 7 mm short axis (series 2/image 39), left greater than right. Near complete opacification of the left maxillary sinus. Visualized paranasal sinuses are otherwise clear. Visualized mastoid air cells are essentially clear. No evidence of maxillofacial fracture.  Mandible is  intact. Bilateral globes are intact. retroconal soft tissues are within normal limits. Visualized brain parenchyma is unremarkable. IMPRESSION: Apical root abscess involving the left upper lateral incisor (tooth #10). Overlying phlegmonous change without discrete fluid collection/abscess. Associated cellulitis involving the left face, as described above. Electronically Signed   By: Charline Bills M.D.   On: 03/07/2016 09:25     Management plans discussed with the patient, family and they are in agreement.  CODE STATUS:     Code Status Orders        Start     Ordered   03/07/16 1148  Full code  Continuous     03/07/16 1147    Code Status History    Date Active Date Inactive Code Status Order ID Comments User Context   This patient has a current code status but no historical code status.      TOTAL TIME TAKING CARE OF THIS PATIENT: 33 minutes.    Hower,  Mardi Mainland.D on 03/09/2016 at 8:37 AM  Between 7am to 6pm - Pager - [phone]  After 6pm go to [email or web address] - Social research officer, government  [employer] [place] [facility]  Office  [phone]  CC: Primary care physician; Dennison Mascot, MD

## 2016-03-12 LAB — CULTURE, BLOOD (ROUTINE X 2)
CULTURE: NO GROWTH
Culture: NO GROWTH

## 2016-03-16 ENCOUNTER — Ambulatory Visit: Payer: 59 | Admitting: Family Medicine

## 2016-03-16 NOTE — Telephone Encounter (Signed)
Patient cancelled her hospital follow up.

## 2016-04-02 ENCOUNTER — Ambulatory Visit (INDEPENDENT_AMBULATORY_CARE_PROVIDER_SITE_OTHER): Payer: 59 | Admitting: Family Medicine

## 2016-04-02 ENCOUNTER — Telehealth: Payer: Self-pay | Admitting: Family Medicine

## 2016-04-02 ENCOUNTER — Encounter: Payer: Self-pay | Admitting: Family Medicine

## 2016-04-02 VITALS — BP 120/67 | HR 102 | Temp 98.4°F | Resp 17 | Ht 62.0 in | Wt 214.9 lb

## 2016-04-02 DIAGNOSIS — F418 Other specified anxiety disorders: Secondary | ICD-10-CM

## 2016-04-02 DIAGNOSIS — E669 Obesity, unspecified: Secondary | ICD-10-CM | POA: Diagnosis not present

## 2016-04-02 DIAGNOSIS — F329 Major depressive disorder, single episode, unspecified: Secondary | ICD-10-CM | POA: Insufficient documentation

## 2016-04-02 DIAGNOSIS — F419 Anxiety disorder, unspecified: Secondary | ICD-10-CM

## 2016-04-02 MED ORDER — NALTREXONE-BUPROPION HCL ER 8-90 MG PO TB12: 2.0000 | ORAL_TABLET | Freq: Two times a day (BID) | ORAL | 0 refills | Status: DC

## 2016-04-02 MED ORDER — SERTRALINE HCL 50 MG PO TABS: 50.0000 mg | ORAL_TABLET | Freq: Every day | ORAL | 0 refills | Status: DC

## 2016-04-02 NOTE — Progress Notes (Signed)
Name: Amber [name]   MRN: [id number]    DOB: [birth year]   Date:04/02/2016       Progress Note  Subjective  Chief Complaint  Chief Complaint  Patient presents with  . Depression    medication / BMI Screening reeval    Depression         This is a chronic problem.  The problem has been gradually improving since onset.  Associated symptoms include helplessness, hopelessness, appetite change, headaches and sad.  Past treatments include SSRIs - Selective serotonin reuptake inhibitors (Started on Sertraline 50 mg daily by Psychiatrist).  Compliance with treatment is good.  Pt. Is also here to get [facility] appeal form to request a reasonable alternative to [facility]'s weight loss goals. Her weight today is 214.9 lbs, she has lost 25 lbs since February 2017 after she delivered her daughter. She was started on Belviq 10 mg in June 2017 but could not take it due to headaches. She is now physically active (walks 20-30 mins every day) and wishes to be started on a different anti-obesity agent.   Past Medical History:  Diagnosis Date  . Anxiety   . Obesity (BMI 30-39.9)   . OSA (obstructive sleep apnea)     Past Surgical History:  Procedure Laterality Date  . BREAST REDUCTION SURGERY  12/18/02  . CESAREAN SECTION    . CESAREAN SECTION WITH BILATERAL TUBAL LIGATION N/A 08/04/2015   Procedure: CESAREAN SECTION WITH BILATERAL TUBAL LIGATION;  Surgeon: Nadara Mustardobert P Harris, MD;  Location: [facility] ORS;  Service: Obstetrics;  Laterality: N/A;  . LAPAROSCOPIC CHOLECYSTECTOMY W/ CHOLANGIOGRAPHY  01/22/11  . NASAL SINUS SURGERY  05/26/03  . TUBAL LIGATION      Family History  Problem Relation Age of Onset  . Hypertension Mother     Social History   Social History  . Marital status: Married    Spouse name: N/A  . Number of children: N/A  . Years of education: N/A   Occupational History  . Not on file.   Social History Main Topics  . Smoking status: Former Smoker    Quit date: 01/05/2003  . Smokeless  tobacco: Never Used  . Alcohol use No     Comment: "occasionally"  . Drug use: No  . Sexual activity: Not on file   Other Topics Concern  . Not on file   Social History Narrative  . No narrative on file     Current Outpatient Prescriptions:  .  ibuprofen (ADVIL,MOTRIN) 200 MG tablet, Take 800 mg by mouth every 6 (six) hours as needed., Disp: , Rfl:  .  sertraline (ZOLOFT) 50 MG tablet, Take 50 mg by mouth daily., Disp: , Rfl:  .  traMADol (ULTRAM) 50 MG tablet, Take 1 tablet (50 mg total) by mouth every 6 (six) hours as needed., Disp: 30 tablet, Rfl: 0 .  Lorcaserin HCl (BELVIQ) 10 MG TABS, Take 1 tablet by mouth 2 (two) times daily. (Patient not taking: Reported on 04/02/2016), Disp: 60 tablet, Rfl: 2 .  Prenatal Vit-Fe Fumarate-FA (MULTIVITAMIN-PRENATAL) 27-0.8 MG TABS tablet, Take 1 tablet by mouth daily at 12 noon. Reported on 11/29/2015, Disp: , Rfl:   Allergies  Allergen Reactions  . Penicillins Hives and Swelling  . Sulfa Antibiotics Hives and Swelling     Review of Systems  Constitutional: Positive for appetite change. Negative for chills, fever and weight loss.  Cardiovascular: Negative for chest pain.  Gastrointestinal: Negative for abdominal pain, nausea and vomiting.  Neurological: Positive for headaches.  Psychiatric/Behavioral: Negative for depression. The patient is not nervous/anxious.     Objective  Vitals:   04/02/16 0920  BP: 120/67  Pulse: (!) 102  Resp: 17  Temp: 98.4 F (36.9 C)  TempSrc: Oral  SpO2: 98%  Weight: 214 lb 14.4 oz (97.5 kg)  Height: 5\' 2"  (1.575 m)    Physical Exam  Constitutional: She is oriented to person, place, and time and well-developed, well-nourished, and in no distress.  HENT:  Head: Normocephalic and atraumatic.  Cardiovascular: Normal rate, regular rhythm and normal heart sounds.   No murmur heard. Pulmonary/Chest: Effort normal and breath sounds normal.  Neurological: She is alert and oriented to person, place, and  time.  Psychiatric: Mood, memory, affect and judgment normal.  Nursing note and vitals reviewed.    Assessment & Plan  1. Obesity (BMI 30-39.9) We will start on Contrave for management of obesity. Advised to continue with dietary and lifestyle changes and physical activity. Recheck in 3 months - Naltrexone-Bupropion HCl ER (CONTRAVE) 8-90 MG TB12; Take 2 tablets by mouth 2 (two) times daily. Start: 1 tab po qAM x 1 wk, then 1 tab po BID x 1 wk, then 2 tabs po qAM and 1 tab Po qPM x 1 wk: Max 4 tabs/day  Dispense: 360 tablet; Refill: 0  2. Anxiety and depression Stable, continue on sertraline 50 mg daily. Refills provided - sertraline (ZOLOFT) 50 MG tablet; Take 1 tablet (50 mg total) by mouth daily.  Dispense: 90 tablet; Refill: 0    Ayeden Gladman Asad A[facility] 04/02/2016 9:38 AM

## 2016-04-16 ENCOUNTER — Other Ambulatory Visit: Payer: 59

## 2016-04-19 NOTE — Telephone Encounter (Signed)
ERRENOUS °

## 2016-04-25 ENCOUNTER — Encounter
Admission: RE | Admit: 2016-04-25 | Discharge: 2016-04-25 | Disposition: A | Discharge status: Home / Self Care | Payer: 59 | Source: Ambulatory Visit | Attending: Obstetrics & Gynecology | Admitting: Obstetrics & Gynecology

## 2016-04-25 HISTORY — DX: Gastro-esophageal reflux disease without esophagitis: K21.9

## 2016-04-25 HISTORY — DX: Headache: R51

## 2016-04-25 HISTORY — DX: Headache, unspecified: R51.9

## 2016-04-25 HISTORY — DX: Anemia, unspecified: D64.9

## 2016-04-25 NOTE — Patient Instructions (Signed)
  Your procedure is scheduled on: 05-04-16 Report to Same Day Surgery 2nd floor medical mall To find out your arrival time please call [phone] between 1PM - 3PM on 05-03-16  Remember: Instructions that are not followed completely may result in serious medical risk, up to and including death, or upon the discretion of your surgeon and anesthesiologist your surgery may need to be rescheduled.    _x___ 1. Do not eat food or drink liquids after midnight. No gum chewing or hard candies.     __x__ 2. No Alcohol for 24 hours before or after surgery.   __x__3. No Smoking for 24 prior to surgery.   ____  4. Bring all medications with you on the day of surgery if instructed.    __x__ 5. Notify your doctor if there is any change in your medical condition     (cold, fever, infections).     Do not wear jewelry, make-up, hairpins, clips or nail polish.  Do not wear lotions, powders, or perfumes. You may wear deodorant.  Do not shave 48 hours prior to surgery. Men may shave face and neck.  Do not bring valuables to the hospital.    [facility] is not responsible for any belongings or valuables.               Contacts, dentures or bridgework may not be worn into surgery.  Leave your suitcase in the car. After surgery it may be brought to your room.  For patients admitted to the hospital, discharge time is determined by your treatment team.   Patients discharged the day of surgery will not be allowed to drive home.    Please read over the following fact sheets that you were given:   [facility] Preparing for Surgery and or MRSA Information   ____ Take these medicines the morning of surgery with A SIP OF WATER:    1. NONE  2.  3.  4.  5.  6.  ____Fleets enema or Magnesium Citrate as directed.   ____ Use CHG Soap or sage wipes as directed on instruction sheet   ____ Use inhalers on the day of surgery and bring to hospital day of surgery  ____ Stop metformin 2 days prior to  surgery    ____ Take 1/2 of usual insulin dose the night before surgery and none on the morning of surgery.   ____ Stop aspirin or coumadin, or plavix  x__ Stop Anti-inflammatories such as Advil, Aleve, Ibuprofen, Motrin, Naproxen,          Naprosyn, Goodies powders or aspirin products NOW-Ok to take Tylenol.   ____ Stop supplements until after surgery.    ____ Bring C-Pap to the hospital.

## 2016-05-04 ENCOUNTER — Encounter
Admission: RE | Disposition: A | Discharge status: Home / Self Care | Payer: Self-pay | Source: Ambulatory Visit | Attending: Obstetrics & Gynecology

## 2016-05-04 ENCOUNTER — Ambulatory Visit: Payer: 59 | Admitting: Anesthesiology

## 2016-05-04 ENCOUNTER — Ambulatory Visit
Admission: RE | Admit: 2016-05-04 | Discharge: 2016-05-04 | Disposition: A | Discharge status: Home / Self Care | Payer: 59 | Source: Ambulatory Visit | Attending: Obstetrics & Gynecology | Admitting: Obstetrics & Gynecology

## 2016-05-04 DIAGNOSIS — F419 Anxiety disorder, unspecified: Secondary | ICD-10-CM | POA: Insufficient documentation

## 2016-05-04 DIAGNOSIS — Z87891 Personal history of nicotine dependence: Secondary | ICD-10-CM | POA: Diagnosis not present

## 2016-05-04 DIAGNOSIS — K219 Gastro-esophageal reflux disease without esophagitis: Secondary | ICD-10-CM | POA: Diagnosis not present

## 2016-05-04 DIAGNOSIS — N9089 Other specified noninflammatory disorders of vulva and perineum: Secondary | ICD-10-CM

## 2016-05-04 DIAGNOSIS — N907 Vulvar cyst: Secondary | ICD-10-CM | POA: Insufficient documentation

## 2016-05-04 HISTORY — PX: LESION REMOVAL: SHX5196

## 2016-05-04 LAB — POCT PREGNANCY, URINE: Preg Test, Ur: NEGATIVE

## 2016-05-04 SURGERY — EXCISION, LESION, VAGINA
Anesthesia: General | Wound class: Clean Contaminated

## 2016-05-04 MED ORDER — FENTANYL CITRATE (PF) 100 MCG/2ML IJ SOLN: 25.0000 ug | INTRAMUSCULAR | Status: DC | PRN

## 2016-05-04 MED ORDER — OXYCODONE-ACETAMINOPHEN 5-325 MG PO TABS: 1.0000 | ORAL_TABLET | ORAL | 0 refills | Status: DC | PRN

## 2016-05-04 MED ORDER — ACETAMINOPHEN 325 MG PO TABS: 650.0000 mg | ORAL_TABLET | ORAL | Status: DC | PRN

## 2016-05-04 MED ORDER — MIDAZOLAM HCL 2 MG/2ML IJ SOLN
INTRAMUSCULAR | Status: DC | PRN
  Administered 2016-05-04: 2 mg via INTRAVENOUS

## 2016-05-04 MED ORDER — DEXAMETHASONE SODIUM PHOSPHATE 10 MG/ML IJ SOLN
INTRAMUSCULAR | Status: DC | PRN
  Administered 2016-05-04: 10 mg via INTRAVENOUS

## 2016-05-04 MED ORDER — ONDANSETRON HCL 4 MG/2ML IJ SOLN
INTRAMUSCULAR | Status: DC | PRN
  Administered 2016-05-04: 4 mg via INTRAVENOUS

## 2016-05-04 MED ORDER — MORPHINE SULFATE (PF) 4 MG/ML IV SOLN: 1.0000 mg | INTRAVENOUS | Status: DC | PRN

## 2016-05-04 MED ORDER — BUPIVACAINE-EPINEPHRINE (PF) 0.5% -1:200000 IJ SOLN
INTRAMUSCULAR | Status: AC
  Filled 2016-05-04: qty 30

## 2016-05-04 MED ORDER — BUPIVACAINE HCL (PF) 0.5 % IJ SOLN
INTRAMUSCULAR | Status: AC
Start: 2016-05-04 — End: 2016-05-04
  Filled 2016-05-04: qty 30

## 2016-05-04 MED ORDER — PROPOFOL 10 MG/ML IV BOLUS
INTRAVENOUS | Status: DC | PRN
  Administered 2016-05-04: 150 mg via INTRAVENOUS

## 2016-05-04 MED ORDER — LACTATED RINGERS IV SOLN
INTRAVENOUS | Status: DC
  Administered 2016-05-04: 13:00:00 via INTRAVENOUS

## 2016-05-04 MED ORDER — ONDANSETRON HCL 4 MG/2ML IJ SOLN: 4.0000 mg | Freq: Once | INTRAMUSCULAR | Status: DC | PRN

## 2016-05-04 MED ORDER — KETOROLAC TROMETHAMINE 30 MG/ML IJ SOLN: 30.0000 mg | Freq: Four times a day (QID) | INTRAMUSCULAR | Status: DC

## 2016-05-04 MED ORDER — ACETAMINOPHEN 650 MG RE SUPP: 650.0000 mg | RECTAL | Status: DC | PRN

## 2016-05-04 MED ORDER — BUPIVACAINE-EPINEPHRINE (PF) 0.5% -1:200000 IJ SOLN
INTRAMUSCULAR | Status: DC | PRN
  Administered 2016-05-04: 12 mL via PERINEURAL

## 2016-05-04 SURGICAL SUPPLY — 26 items
BAG COUNTER SPONGE EZ (MISCELLANEOUS) IMPLANT
BAG SPNG 4X4 CLR HAZ (MISCELLANEOUS)
BLADE SURG 15 STRL LF DISP TIS (BLADE) ×1 IMPLANT
BLADE SURG 15 STRL SS (BLADE) ×3
BLADE SURG SZ10 CARB STEEL (BLADE) ×3 IMPLANT
CATH ROBINSON RED A/P 16FR (CATHETERS) ×3 IMPLANT
COUNTER SPONGE BAG EZ (MISCELLANEOUS)
DRAPE PERI LITHO V/GYN (MISCELLANEOUS) ×3 IMPLANT
DRAPE UNDER BUTTOCK W/FLU (DRAPES) ×3 IMPLANT
ELECT REM PT RETURN 9FT ADLT (ELECTROSURGICAL)
ELECTRODE REM PT RTRN 9FT ADLT (ELECTROSURGICAL) IMPLANT
GLOVE BIO SURGEON STRL SZ8 (GLOVE) ×3 IMPLANT
GOWN STRL REUS W/ TWL LRG LVL3 (GOWN DISPOSABLE) ×1 IMPLANT
GOWN STRL REUS W/ TWL XL LVL3 (GOWN DISPOSABLE) ×1 IMPLANT
GOWN STRL REUS W/TWL LRG LVL3 (GOWN DISPOSABLE) ×3
GOWN STRL REUS W/TWL XL LVL3 (GOWN DISPOSABLE) ×3
KIT RM TURNOVER CYSTO AR (KITS) ×3 IMPLANT
NDL SAFETY 22GX1.5 (NEEDLE) ×3 IMPLANT
NS IRRIG 500ML POUR BTL (IV SOLUTION) ×3 IMPLANT
PACK BASIN MINOR ARMC (MISCELLANEOUS) ×3 IMPLANT
PAD OB MATERNITY 4.3X12.25 (PERSONAL CARE ITEMS) ×3 IMPLANT
PAD PREP 24X41 OB/GYN DISP (PERSONAL CARE ITEMS) ×3 IMPLANT
SPONGE XRAY 4X4 16PLY STRL (MISCELLANEOUS) ×3 IMPLANT
SUT VIC AB 2-0 CT1 27 (SUTURE) ×3
SUT VIC AB 2-0 CT1 TAPERPNT 27 (SUTURE) ×1 IMPLANT
SYRINGE 10CC LL (SYRINGE) ×3 IMPLANT

## 2016-05-04 NOTE — Transfer of Care (Signed)
Immediate Anesthesia Transfer of Care Note  Patient: Amber [name]  Procedure(s) Performed: Procedure(s): EXCISION VAGINAL LESION (N/A)  Patient Location: PACU  Anesthesia Type:General  Level of Consciousness: patient cooperative and lethargic  Airway & Oxygen Therapy: Patient Spontanous Breathing and Patient connected to face mask oxygen  Post-op Assessment: Report given to RN and Post -op Vital signs reviewed and stable  Post vital signs: Reviewed and stable  Last Vitals:  Vitals:   05/04/16 1219 05/04/16 1609  BP: 114/62 107/63  Pulse: 83 86  Resp: 16 15  Temp: (!) 35.9 C 36.8 C    Last Pain:  Vitals:   05/04/16 1219  TempSrc: Tympanic         Complications: No apparent anesthesia complications

## 2016-05-04 NOTE — Anesthesia Postprocedure Evaluation (Signed)
Anesthesia Post Note  Patient: Amber [name]  Procedure(s) Performed: Procedure(s) (LRB): EXCISION VAGINAL LESION (N/A)  Patient location during evaluation: PACU Anesthesia Type: General Level of consciousness: awake and alert Pain management: pain level controlled Vital Signs Assessment: post-procedure vital signs reviewed and stable Respiratory status: spontaneous breathing, nonlabored ventilation, respiratory function stable and patient connected to nasal cannula oxygen Cardiovascular status: blood pressure returned to baseline and stable Postop Assessment: no signs of nausea or vomiting Anesthetic complications: no    Last Vitals:  Vitals:   05/04/16 1646 05/04/16 1703  BP: 110/81 (!) 132/59  Pulse: 93 77  Resp: 18 18  Temp: 36.7 C     Last Pain:  Vitals:   05/04/16 1646  TempSrc: Temporal  PainSc:                  Amber [name]

## 2016-05-04 NOTE — Op Note (Signed)
Evelena AsaWendy M Wegener PROCEDURE DATE: 05/04/2016  PRE-OPERATIVE DIAGNOSIS:  right labial cyst  POST-OPERATIVE DIAGNOSIS:  right labial cyst  PROCEDURE:  Procedure(s): EXCISION VULVAR LESION  SURGEON:  Surgeon(s) and Role:    * Nadara Mustardobert P Areyana Leoni, MD - Primary  ANESTHESIA:   general  EBL:  Total I/O In: 450 [I.V.:450] Out: -   BLOOD ADMINISTERED:none  DRAINS: none   LOCAL MEDICATIONS USED:  MARCAINE     SPECIMEN:  Excision specimen of right vulva, second small lesion  DISPOSITION OF SPECIMEN:  PATHOLOGY  COMPLICATIONS: None immediate  PROCEDURE IN DETAIL:  The patient was taken to the operating room where general anesthesia was administered and was found to be adequate.  She was placed in the dorsal lithotomy position, and was prepped and draped in a sterile manner.  After an adequate timeout was performed, attention was turned to the right side of her vulva where the large 4cm vulvar fleshy mass was seen was palpated. An elliptical incision was made around the mass, and then it was excised.  Deep 2-0 suture to plicate the deep layers together is performed followed by skin subcuticular closure with a 4-0 vicryl suture and dermabond.  The second smaller 0.5cm warty lesion is also excised and closed with 4-0 vicryl and Dermabond.  Good hemostasis was noted. Patient goes to PACU in good condition; all sponges, sharps, and instruments accounted for.

## 2016-05-04 NOTE — H&P (Signed)
History and Physical Interval Note:  05/04/2016 1:55 PM  Amber [name]  has presented today for surgery, with the diagnosis of right labial cyst  The various methods of treatment have been discussed with the patient and family. After consideration of risks, benefits and other options for treatment, the patient has consented to  Procedure(s): EXCISION VAGINAL LESION (N/A) as a surgical intervention .  The patient's history has been reviewed, patient examined, no change in status, stable for surgery.  Pt has the following beta blocker history-  Not taking Beta Blocker.  I have reviewed the patient's chart and labs.  Questions were answered to the patient's satisfaction.       Letitia Libraobert Paul Kate Larock

## 2016-05-04 NOTE — Discharge Instructions (Signed)
Keep area clean and dry.  General Anesthesia, Adult, Care After Refer to this sheet in the next few weeks. These instructions provide you with information on caring for yourself after your procedure. Your health care provider may also give you more specific instructions. Your treatment has been planned according to current medical practices, but problems sometimes occur. Call your health care provider if you have any problems or questions after your procedure. WHAT TO EXPECT AFTER THE PROCEDURE After the procedure, it is typical to experience:  Sleepiness.  Nausea and vomiting. HOME CARE INSTRUCTIONS  For the first 24 hours after general anesthesia:  Have a responsible person with you.  Do not drive a car. If you are alone, do not take public transportation.  Do not drink alcohol.  Do not take medicine that has not been prescribed by your health care provider.  Do not sign important papers or make important decisions.  You may resume a normal diet and activities as directed by your health care provider.  Change bandages (dressings) as directed.  If you have questions or problems that seem related to general anesthesia, call the hospital and ask for the anesthetist or anesthesiologist on call. SEEK MEDICAL CARE IF:  You have nausea and vomiting that continue the day after anesthesia.  You develop a rash. SEEK IMMEDIATE MEDICAL CARE IF:   You have difficulty breathing.  You have chest pain.  You have any allergic problems.   This information is not intended to replace advice given to you by your health care provider. Make sure you discuss any questions you have with your health care provider.   Document Released: 09/17/2000 Document Revised: 07/02/2014 Document Reviewed: 10/10/2011 Elsevier Interactive Patient Education [employer]2016 Elsevier Inc.

## 2016-05-04 NOTE — Anesthesia Preprocedure Evaluation (Addendum)
Anesthesia Evaluation  Patient identified by MRN, date of birth, ID band Patient awake    Reviewed: Allergy & Precautions, NPO status , Patient's Chart, lab work & pertinent test results, reviewed documented beta blocker date and time   Airway Mallampati: III  TM Distance: >3 FB     Dental  (+) Chipped, Missing   Pulmonary sleep apnea and Continuous Positive Airway Pressure Ventilation , former smoker,           Cardiovascular      Neuro/Psych  Headaches, Anxiety    GI/Hepatic GERD  Controlled,  Endo/Other    Renal/GU      Musculoskeletal   Abdominal   Peds  Hematology  (+) anemia ,   Anesthesia Other Findings Gastric sleeve. No problems with regurg. Will proceed with LMA.  Reproductive/Obstetrics                            Anesthesia Physical Anesthesia Plan  ASA: III  Anesthesia Plan: General   Post-op Pain Management:    Induction: Intravenous  Airway Management Planned: LMA  Additional Equipment:   Intra-op Plan:   Post-operative Plan:   Informed Consent: I have reviewed the patients History and Physical, chart, labs and discussed the procedure including the risks, benefits and alternatives for the proposed anesthesia with the patient or authorized representative who has indicated his/her understanding and acceptance.     Plan Discussed with: CRNA  Anesthesia Plan Comments:         Anesthesia Quick Evaluation

## 2016-05-04 NOTE — Anesthesia Procedure Notes (Signed)
Procedure Name: LMA Insertion Date/Time: 05/04/2016 3:33 PM Performed by: Omer JackWEATHERLY, Bonita Brindisi Pre-anesthesia Checklist: Patient identified, Patient being monitored, Timeout performed, Emergency Drugs available and Suction available Patient Re-evaluated:Patient Re-evaluated prior to inductionOxygen Delivery Method: Circle system utilized Preoxygenation: Pre-oxygenation with 100% oxygen Intubation Type: IV induction Ventilation: Mask ventilation without difficulty LMA: LMA inserted LMA Size: 4.0 Tube type: Oral Number of attempts: 1 Placement Confirmation: positive ETCO2 and breath sounds checked- equal and bilateral Tube secured with: Tape Dental Injury: Teeth and Oropharynx as per pre-operative assessment

## 2016-05-08 ENCOUNTER — Encounter: Payer: Self-pay | Admitting: Obstetrics & Gynecology

## 2016-05-10 LAB — SURGICAL PATHOLOGY

## 2016-06-07 ENCOUNTER — Ambulatory Visit (INDEPENDENT_AMBULATORY_CARE_PROVIDER_SITE_OTHER): Payer: 59

## 2016-06-07 ENCOUNTER — Ambulatory Visit (INDEPENDENT_AMBULATORY_CARE_PROVIDER_SITE_OTHER): Payer: 59 | Admitting: Podiatry

## 2016-06-07 VITALS — BP 102/60 | HR 81 | Temp 97.9°F | Resp 16

## 2016-06-07 DIAGNOSIS — R52 Pain, unspecified: Secondary | ICD-10-CM

## 2016-06-07 DIAGNOSIS — M79672 Pain in left foot: Secondary | ICD-10-CM

## 2016-06-07 DIAGNOSIS — M722 Plantar fascial fibromatosis: Secondary | ICD-10-CM | POA: Diagnosis not present

## 2016-06-07 MED ORDER — MELOXICAM 15 MG PO TABS: 15.0000 mg | ORAL_TABLET | Freq: Every day | ORAL | 0 refills | Status: DC

## 2016-06-07 NOTE — Progress Notes (Signed)
   Subjective:    Patient ID: Amber [name], female    DOB: Feb 18, 1981, 35 y.o.   MRN: [id number]  HPI    Review of Systems  Musculoskeletal: Positive for arthralgias and myalgias.  All other systems reviewed and are negative.      Objective:   Physical Exam        Assessment & Plan:

## 2016-06-10 MED ORDER — BETAMETHASONE SOD PHOS & ACET 6 (3-3) MG/ML IJ SUSP: 3.0000 mg | Freq: Once | INTRAMUSCULAR | Status: DC

## 2016-06-10 NOTE — Progress Notes (Signed)
Patient ID: Amber [name], female   DOB: Nov 12, 1980, 35 y.o.   MRN: [id number] Subjective: Patient presents today for pain and tenderness in the left foot. Patient states the foot pain has been hurting for several weeks now. Patient states that it hurts in the mornings with the first steps out of bed. Patient presents today for further treatment and evaluation  Objective: Physical Exam General: The patient is alert and oriented x3 in no acute distress.  Dermatology: Skin is warm, dry and supple bilateral lower extremities. Negative for open lesions or macerations bilateral.   Vascular: Dorsalis Pedis and Posterior Tibial pulses palpable bilateral.  Capillary fill time is immediate to all digits.  Neurological: Epicritic and protective threshold intact bilateral.   Musculoskeletal: Tenderness to palpation at the medial calcaneal tubercale and through the insertion of the plantar fascia of the left foot. All other joints range of motion within normal limits bilateral. Strength 5/5 in all groups bilateral.   Radiographic exam:   Normal osseous mineralization. Joint spaces preserved. No fracture/dislocation/boney destruction. Calcaneal spur present with mild thickening of plantar fascia left. No other soft tissue abnormalities or radiopaque foreign bodies.   Assessment: 1. Plantar fasciitis left foot 2. Pain in left foot  Plan of Care:   1. Patient evaluated. Xrays reviewed.   2. Injection of 0.5cc Celestone soluspan injected into the left plantar fascia.  3. Instructed patient regarding therapies and modalities at home to alleviate symptoms.  4. Rx for meloxicam 15mg  PO given to patient.  5. Plantar fascial band(s) dispensed. 6. Return to clinic in 4 weeks.

## 2016-07-04 ENCOUNTER — Other Ambulatory Visit: Payer: Self-pay | Admitting: Podiatry

## 2016-07-04 NOTE — Telephone Encounter (Signed)
Pt needs an appt prior to future refills. 

## 2016-07-06 ENCOUNTER — Ambulatory Visit: Payer: 59 | Admitting: Podiatry

## 2016-07-25 ENCOUNTER — Other Ambulatory Visit: Payer: Self-pay | Admitting: Podiatry

## 2016-07-25 NOTE — Telephone Encounter (Signed)
Pt needs an appt prior to future refills. 

## 2016-08-31 ENCOUNTER — Other Ambulatory Visit: Payer: Self-pay | Admitting: Podiatry

## 2016-09-06 ENCOUNTER — Encounter: Payer: Self-pay | Admitting: Obstetrics and Gynecology

## 2016-09-06 ENCOUNTER — Ambulatory Visit (INDEPENDENT_AMBULATORY_CARE_PROVIDER_SITE_OTHER): Payer: 59 | Admitting: Obstetrics and Gynecology

## 2016-09-06 VITALS — BP 120/78 | HR 76 | Ht 61.5 in | Wt 211.0 lb

## 2016-09-06 DIAGNOSIS — N921 Excessive and frequent menstruation with irregular cycle: Secondary | ICD-10-CM | POA: Diagnosis not present

## 2016-09-06 DIAGNOSIS — R1032 Left lower quadrant pain: Secondary | ICD-10-CM | POA: Diagnosis not present

## 2016-09-06 NOTE — Progress Notes (Signed)
   HPI:      [name] is a 36 y.o. G2P2001 who LMP was Patient's last menstrual period was 08/26/2016., presents today for a problem visit.  She is single partner, contraception - tubal ligation.   She complains of heavier periods and increased cramping over the past 4 months. Her menses are usually monthly, lasting 7 days with med flow. They have been heavy the past few months, changing super pads every hour, and with 1/2 dollar sized clots. She denies BTB. She has had terrible cramping/pelvic pain with her menses the past few months as well, somewhat improved with ibup. She still has mild LLQ pain today and bleeding stopped a couple months ago. She denies any dyspareunia, non-menstrual pelvic pain, postcoital bleeding. She is 1 yr PP and is s/p PPTL.    Review of Systems  Constitutional: Negative for fever.  Gastrointestinal: Negative for blood in stool, constipation, diarrhea, nausea and vomiting.  Genitourinary: Positive for menstrual problem. Negative for dyspareunia, dysuria, flank pain, frequency, hematuria, urgency, vaginal bleeding, vaginal discharge and vaginal pain.  Musculoskeletal: Negative for back pain.  Skin: Negative for rash.    Past Medical History:  Diagnosis Date  . Anemia   . GERD (gastroesophageal reflux disease)    OCC  . Headache    H/O  . Obesity (BMI 30-39.9)   . OSA (obstructive sleep apnea)    USE CPAP    Family History  Problem Relation Age of Onset  . Hypertension Mother      OBJECTIVE:   Vitals:  BP 120/78   Pulse 76   Ht 5' 1.5" (1.562 m)   Wt 211 lb (95.7 kg)   LMP 08/26/2016   BMI 39.22 kg/m   Physical Exam  Constitutional: She is oriented to person, place, and time and well-developed, well-nourished, and in no distress. Vital signs are normal.  Genitourinary: Vagina normal, uterus normal, cervix normal, right adnexa normal, left adnexa normal and vulva normal. Uterus is not enlarged. Cervix exhibits no motion tenderness and no  tenderness. Right adnexum displays no mass and no tenderness. Left adnexum displays no mass and no tenderness. Vulva exhibits no erythema, no exudate, no lesion, no rash and no tenderness. Vagina exhibits no lesion.  Neurological: She is oriented to person, place, and time.     Assessment/Plan: Menometrorrhagia - Sx for 4 months. Chekc labs/u/s. If neg, most likely due to PPTL. Discussed ablation, IUD, BC, lysteda. Will f/u with results and mgmt. - Plan: US Transvaginal Non-OB, TSH + free T4, Prolactin, CBC, CANCELED: TSH + free T4, CANCELED: Prolactin, CANCELED: CBC with Differential/Platelet  LLQ pain    Return in about 1 day (around 09/07/2016), or if symptoms worsen or fail to improve, for GYN u/s for menometrorrhagia/ ABC to call pt.  Liane Tribbey [facility], PA-C 09/06/2016 11:37 AM

## 2016-09-07 LAB — CBC WITH DIFFERENTIAL/PLATELET
BASOS ABS: 0.1 10*3/uL (ref 0.0–0.2)
Basos: 1 %
EOS (ABSOLUTE): 0.1 10*3/uL (ref 0.0–0.4)
Eos: 1 %
Hematocrit: 34.4 % (ref 34.0–46.6)
Hemoglobin: 10.3 g/dL — ABNORMAL LOW (ref 11.1–15.9)
IMMATURE GRANS (ABS): 0 10*3/uL (ref 0.0–0.1)
IMMATURE GRANULOCYTES: 0 %
LYMPHS: 33 %
Lymphocytes Absolute: 2.2 10*3/uL (ref 0.7–3.1)
MCH: 21.6 pg — AB (ref 26.6–33.0)
MCHC: 29.9 g/dL — ABNORMAL LOW (ref 31.5–35.7)
MCV: 72 fL — ABNORMAL LOW (ref 79–97)
MONOS ABS: 0.5 10*3/uL (ref 0.1–0.9)
Monocytes: 7 %
Neutrophils Absolute: 3.8 10*3/uL (ref 1.4–7.0)
Neutrophils: 58 %
PLATELETS: 311 10*3/uL (ref 150–379)
RBC: 4.76 x10E6/uL (ref 3.77–5.28)
RDW: 18 % — AB (ref 12.3–15.4)
WBC: 6.6 10*3/uL (ref 3.4–10.8)

## 2016-09-07 LAB — PROLACTIN: PROLACTIN: 12.3 ng/mL (ref 4.8–23.3)

## 2016-09-07 LAB — TSH+FREE T4
Free T4: 1.23 ng/dL (ref 0.82–1.77)
TSH: 1.22 u[IU]/mL (ref 0.450–4.500)

## 2016-09-10 ENCOUNTER — Ambulatory Visit (INDEPENDENT_AMBULATORY_CARE_PROVIDER_SITE_OTHER): Payer: 59

## 2016-09-10 DIAGNOSIS — N921 Excessive and frequent menstruation with irregular cycle: Secondary | ICD-10-CM | POA: Diagnosis not present

## 2016-09-11 ENCOUNTER — Other Ambulatory Visit: Payer: Self-pay | Admitting: Obstetrics and Gynecology

## 2016-09-11 ENCOUNTER — Telehealth: Payer: Self-pay | Admitting: Obstetrics and Gynecology

## 2016-09-11 DIAGNOSIS — N920 Excessive and frequent menstruation with regular cycle: Secondary | ICD-10-CM

## 2016-09-11 MED ORDER — TRANEXAMIC ACID 650 MG PO TABS: 1300.0000 mg | ORAL_TABLET | Freq: Three times a day (TID) | ORAL | 2 refills | Status: DC

## 2016-09-11 NOTE — Telephone Encounter (Signed)
Pt aware of neg labs except mild anemia. She is taking Fe supp. GYN u/s neg for menorrhagia pathology. Discussed ablation, IUD, OCPs, lysteda. Pt didn't like IUD in the past and doesn't want hormones, if possible. Will try lysteda. Rx eRxd. F/u prn sx.

## 2016-10-08 ENCOUNTER — Ambulatory Visit: Payer: 59 | Admitting: Primary Care

## 2016-10-22 ENCOUNTER — Encounter: Payer: Self-pay | Admitting: Primary Care

## 2016-10-22 ENCOUNTER — Ambulatory Visit (INDEPENDENT_AMBULATORY_CARE_PROVIDER_SITE_OTHER): Payer: 59 | Admitting: Primary Care

## 2016-10-22 VITALS — BP 134/72 | HR 72 | Temp 98.0°F | Ht 61.5 in | Wt 208.8 lb

## 2016-10-22 DIAGNOSIS — M79609 Pain in unspecified limb: Secondary | ICD-10-CM

## 2016-10-22 DIAGNOSIS — R202 Paresthesia of skin: Secondary | ICD-10-CM | POA: Insufficient documentation

## 2016-10-22 DIAGNOSIS — F419 Anxiety disorder, unspecified: Secondary | ICD-10-CM

## 2016-10-22 DIAGNOSIS — F329 Major depressive disorder, single episode, unspecified: Secondary | ICD-10-CM

## 2016-10-22 DIAGNOSIS — E669 Obesity, unspecified: Secondary | ICD-10-CM | POA: Diagnosis not present

## 2016-10-22 MED ORDER — FLUOXETINE HCL 20 MG PO TABS: 20.0000 mg | ORAL_TABLET | Freq: Every day | ORAL | 1 refills | Status: DC

## 2016-10-22 NOTE — Progress Notes (Signed)
Subjective:    Patient ID: Amber [name], female    DOB: 12-03-1980, 36 y.o.   MRN: [id number]  HPI  [name] is a 36 year old female who presents today to establish care and discuss the problems mentioned below. Will obtain old records.  1) Anxiety and Depression: Diagnosed years ago, worse over the past 6 months. She was managed Zoloft at one point and didn't notice improvement. She experiences symptoms of palpitations, feeling nervious, irritability, little interest in doing things, sometimes feels down, mind races at bedtime.  GAD 7 score of 16 and PHQ 9 score of 16. She has undergone therapy in the past with improvement, but this was too costly. She denies SI/HI.   2) OSA: Diagnosed in 2013. She have been compliant to her CPAP and just needs new equipment including face mask and tubing.   3) Extremity Pain: Located to bilateral upper and lower extremities for which she describes as achy and burning. This has been present for the past 1 month. She cannot get comfortable. Pain is mostly to entire lower extremities and forearms. Her pain is constatnt with intermittent numbness/tingling. She feels as though she has to constantly move her legs to get comfortable, symptoms are worse at night.  She denies calf swelling, changes in skin color, shortness of breath, chest pain.   Review of Systems  Constitutional: Negative for unexpected weight change.  Respiratory: Negative for shortness of breath.   Cardiovascular: Negative for chest pain.  Gastrointestinal: Negative for abdominal pain.  Genitourinary: Negative for menstrual problem.  Musculoskeletal: Positive for myalgias.  Allergic/Immunologic: Negative for environmental allergies.  Neurological: Positive for numbness. Negative for weakness.  Hematological: Negative for adenopathy.  Psychiatric/Behavioral: Positive for sleep disturbance. Negative for suicidal ideas. The patient is nervous/anxious.        See HPI       Past Medical  History:  Diagnosis Date  . Anemia   . GERD (gastroesophageal reflux disease)    OCC  . Headache    H/O  . Obesity (BMI 30-39.9)   . OSA (obstructive sleep apnea)    USE CPAP     Social History   Social History  . Marital status: Married    Spouse name: N/A  . Number of children: N/A  . Years of education: N/A   Occupational History  . Not on file.   Social History Main Topics  . Smoking status: Former Smoker    Packs/day: 0.50    Years: 2.00    Types: Cigarettes    Quit date: 01/05/2003  . Smokeless tobacco: Never Used  . Alcohol use No     Comment: "occasionally"  . Drug use: No  . Sexual activity: Yes    Birth control/ protection: None   Other Topics Concern  . Not on file   Social History Narrative   Married.   2 children.   Works in Clinical biochemist.   Enjoys spending time with family.    Past Surgical History:  Procedure Laterality Date  . BREAST REDUCTION SURGERY  12/18/02  . CESAREAN SECTION    . CESAREAN SECTION WITH BILATERAL TUBAL LIGATION N/A 08/04/2015   Procedure: CESAREAN SECTION WITH BILATERAL TUBAL LIGATION;  Surgeon: Amber Mustard, MD;  Location: [facility] ORS;  Service: Obstetrics;  Laterality: N/A;  . LAPAROSCOPIC CHOLECYSTECTOMY W/ CHOLANGIOGRAPHY  01/22/11  . LESION REMOVAL N/A 05/04/2016   Procedure: EXCISION VAGINAL LESION;  Surgeon: Amber Mustard, MD;  Location: [facility] ORS;  Service: Gynecology;  Laterality: N/A;  . NASAL SINUS SURGERY  05/26/03  . SLEEVE GASTROPLASTY    . TUBAL LIGATION      Family History  Problem Relation Age of Onset  . Hypertension Mother   . Leukemia Maternal Grandmother   . Diabetes Maternal Grandfather     Allergies  Allergen Reactions  . Penicillins Hives and Swelling    Has patient had a PCN reaction causing immediate rash, facial/tongue/throat swelling, SOB or lightheadedness with hypotension:No  Has patient had a PCN reaction causing severe rash involving mucus membranes or skin necrosis:Yes Has  patient had a PCN reaction that required hospitalization:No Has patient had a PCN reaction occurring within the last 10 years:No If all of the above answers are "NO", then may proceed with Cephalosporin use.   . Wellbutrin [Bupropion] Other (See Comments)    Headaches  . Sulfa Antibiotics Hives and Swelling    Current Outpatient Prescriptions on File Prior to Visit  Medication Sig Dispense Refill  . Multiple Vitamin (MULTI-VITAMINS) TABS Take by mouth.     No current facility-administered medications on file prior to visit.     BP 134/72   Pulse 72   Temp 98 F (36.7 C) (Oral)   Ht 5' 1.5" (1.562 m)   Wt 208 lb 12.8 oz (94.7 kg)   LMP 10/15/2016   SpO2 98%   Breastfeeding? No   BMI 38.81 kg/m    Objective:   Physical Exam  Constitutional: She appears well-nourished.  Neck: Neck supple.  Cardiovascular: Normal rate and regular rhythm.   Pulses:      Dorsalis pedis pulses are 2+ on the right side, and 2+ on the left side.       Posterior tibial pulses are 2+ on the right side, and 2+ on the left side.  Pulmonary/Chest: Effort normal and breath sounds normal.  Musculoskeletal: Normal range of motion.  Neurological: Coordination normal.  Skin: Skin is warm and dry.  Psychiatric: She has a normal mood and affect.          Assessment & Plan:

## 2016-10-22 NOTE — Patient Instructions (Signed)
Complete lab work prior to leaving today. I will notify you of your results once received.   Start fluoxetine 20 mg tablets for anxiety and depression. Start by taking 1/2 tablet daily for 8 days, then advance to 1 full tablet thereafter.  Down the APP "My Fitness Pal" for calorie counting and fitness tracking.  Increase vegetables, fruit, whole grains.   Ensure you are consuming 64 ounces of water daily.  Start exercising. You should be getting 150 minutes of moderate intensity exercise weekly.  Please schedule a follow up visit in 6 weeks for re-evaluation of anxiety and depression.  It was a pleasure to meet you today! Please don't hesitate to call me with any questions. Welcome to [facility]!

## 2016-10-22 NOTE — Progress Notes (Signed)
Pre visit review using our clinic review tool, if applicable. No additional management support is needed unless otherwise documented below in the visit note. 

## 2016-10-22 NOTE — Assessment & Plan Note (Signed)
Check CBC, B 12, A1C. TSH checked per GYN which was unremarkable. Good pulses and strength. No neck or back pain. Symptoms sound like restless leg, unlikely given age but will not exclude. Consider neuro evaluation if labs unremarkable.

## 2016-10-22 NOTE — Assessment & Plan Note (Signed)
Discussed the importance of a healthy diet and regular exercise in order for weight loss, and to reduce the risk of other medical diseases. Information provided today.

## 2016-10-22 NOTE — Assessment & Plan Note (Signed)
Prior history, increase in symptoms over last 6 months. GAD 7 score of 16 and PHQ 9 score of 16 today. Discussed treatment options, she elects for medication.  Rx for Fluoxetine 20 mg tablets sent to pharmacy. Patient is to take 1/2 tablet daily for 8 days, then advance to 1 full tablet thereafter. We discussed possible side effects of headache, GI upset, drowsiness, and SI/HI. If thoughts of SI/HI develop, we discussed to present to the emergency immediately. Patient verbalized understanding.   Follow up in 6 weeks for re-evaluation.

## 2016-10-23 ENCOUNTER — Other Ambulatory Visit: Payer: Self-pay | Admitting: Primary Care

## 2016-10-23 DIAGNOSIS — D509 Iron deficiency anemia, unspecified: Secondary | ICD-10-CM

## 2016-10-23 LAB — CBC
HEMATOCRIT: 32.4 % — AB (ref 34.0–46.6)
HEMOGLOBIN: 9.4 g/dL — AB (ref 11.1–15.9)
MCH: 21 pg — ABNORMAL LOW (ref 26.6–33.0)
MCHC: 29 g/dL — ABNORMAL LOW (ref 31.5–35.7)
MCV: 73 fL — ABNORMAL LOW (ref 79–97)
Platelets: 341 10*3/uL (ref 150–379)
RBC: 4.47 x10E6/uL (ref 3.77–5.28)
RDW: 17.7 % — AB (ref 12.3–15.4)
WBC: 6.1 10*3/uL (ref 3.4–10.8)

## 2016-10-23 LAB — HEMOGLOBIN A1C
ESTIMATED AVERAGE GLUCOSE: 100 mg/dL
Hgb A1c MFr Bld: 5.1 % (ref 4.8–5.6)

## 2016-10-23 LAB — VITAMIN B12: VITAMIN B 12: 844 pg/mL (ref 232–1245)

## 2016-11-12 DIAGNOSIS — N390 Urinary tract infection, site not specified: Secondary | ICD-10-CM | POA: Insufficient documentation

## 2016-11-12 DIAGNOSIS — Z87891 Personal history of nicotine dependence: Secondary | ICD-10-CM | POA: Insufficient documentation

## 2016-11-12 DIAGNOSIS — B349 Viral infection, unspecified: Secondary | ICD-10-CM | POA: Insufficient documentation

## 2016-11-12 DIAGNOSIS — R509 Fever, unspecified: Secondary | ICD-10-CM | POA: Diagnosis present

## 2016-11-12 MED ORDER — ACETAMINOPHEN 325 MG PO TABS
650.0000 mg | ORAL_TABLET | Freq: Once | ORAL | Status: AC | PRN
  Administered 2016-11-12: 650 mg via ORAL
  Filled 2016-11-12: qty 2

## 2016-11-12 NOTE — ED Triage Notes (Signed)
Pt presents to ED c/o body aches, emesis, and fever since yesterday.

## 2016-11-13 ENCOUNTER — Emergency Department: Payer: 59

## 2016-11-13 ENCOUNTER — Emergency Department
Admission: EM | Admit: 2016-11-13 | Discharge: 2016-11-13 | Disposition: A | Discharge status: Home / Self Care | Payer: 59 | Attending: Emergency Medicine | Admitting: Emergency Medicine

## 2016-11-13 DIAGNOSIS — B349 Viral infection, unspecified: Secondary | ICD-10-CM

## 2016-11-13 DIAGNOSIS — R509 Fever, unspecified: Secondary | ICD-10-CM

## 2016-11-13 DIAGNOSIS — N390 Urinary tract infection, site not specified: Secondary | ICD-10-CM

## 2016-11-13 LAB — CBC WITH DIFFERENTIAL/PLATELET
BASOS ABS: 0 10*3/uL (ref 0–0.1)
Basophils Relative: 0 %
EOS PCT: 0 %
Eosinophils Absolute: 0 10*3/uL (ref 0–0.7)
HEMATOCRIT: 38.8 % (ref 35.0–47.0)
Hemoglobin: 12.4 g/dL (ref 12.0–16.0)
Lymphocytes Relative: 2 %
Lymphs Abs: 0.2 10*3/uL — ABNORMAL LOW (ref 1.0–3.6)
MCH: 23 pg — ABNORMAL LOW (ref 26.0–34.0)
MCHC: 32.1 g/dL (ref 32.0–36.0)
MCV: 71.7 fL — ABNORMAL LOW (ref 80.0–100.0)
MONO ABS: 0.4 10*3/uL (ref 0.2–0.9)
Monocytes Relative: 4 %
NEUTROS ABS: 8 10*3/uL — AB (ref 1.4–6.5)
Neutrophils Relative %: 94 %
PLATELETS: 235 10*3/uL (ref 150–440)
RBC: 5.41 MIL/uL — ABNORMAL HIGH (ref 3.80–5.20)
RDW: 24.2 % — AB (ref 11.5–14.5)
WBC: 8.6 10*3/uL (ref 3.6–11.0)

## 2016-11-13 LAB — URINALYSIS, ROUTINE W REFLEX MICROSCOPIC: Specific Gravity, Urine: 1.03 (ref 1.005–1.030)

## 2016-11-13 LAB — COMPREHENSIVE METABOLIC PANEL
ALBUMIN: 4.2 g/dL (ref 3.5–5.0)
ALT: 15 U/L (ref 14–54)
ANION GAP: 8 (ref 5–15)
AST: 16 U/L (ref 15–41)
Alkaline Phosphatase: 34 U/L — ABNORMAL LOW (ref 38–126)
BUN: 23 mg/dL — AB (ref 6–20)
CHLORIDE: 106 mmol/L (ref 101–111)
CO2: 22 mmol/L (ref 22–32)
Calcium: 8.1 mg/dL — ABNORMAL LOW (ref 8.9–10.3)
Creatinine, Ser: 0.87 mg/dL (ref 0.44–1.00)
GFR calc Af Amer: >60 mL/min (ref >60)
GFR calc non Af Amer: >60 mL/min (ref >60)
GLUCOSE: 141 mg/dL — AB (ref 65–99)
POTASSIUM: 3.5 mmol/L (ref 3.5–5.1)
SODIUM: 136 mmol/L (ref 135–145)
Total Bilirubin: 1 mg/dL (ref 0.3–1.2)
Total Protein: 7 g/dL (ref 6.5–8.1)

## 2016-11-13 LAB — POCT RAPID STREP A: Streptococcus, Group A Screen (Direct): NEGATIVE

## 2016-11-13 LAB — INFLUENZA PANEL BY PCR (TYPE A & B)
INFLAPCR: NEGATIVE
INFLBPCR: NEGATIVE

## 2016-11-13 LAB — PREGNANCY, URINE: Preg Test, Ur: NEGATIVE

## 2016-11-13 MED ORDER — ONDANSETRON 4 MG PO TBDP: 4.0000 mg | ORAL_TABLET | Freq: Three times a day (TID) | ORAL | 0 refills | Status: DC | PRN

## 2016-11-13 MED ORDER — DIPHENHYDRAMINE HCL 50 MG/ML IJ SOLN
INTRAMUSCULAR | Status: AC
  Filled 2016-11-13: qty 1

## 2016-11-13 MED ORDER — SODIUM CHLORIDE 0.9 % IV BOLUS (SEPSIS)
1000.0000 mL | Freq: Once | INTRAVENOUS | Status: AC
  Administered 2016-11-13: 1000 mL via INTRAVENOUS

## 2016-11-13 MED ORDER — ONDANSETRON HCL 4 MG/2ML IJ SOLN
4.0000 mg | Freq: Once | INTRAMUSCULAR | Status: AC
  Administered 2016-11-13: 4 mg via INTRAVENOUS
  Filled 2016-11-13: qty 2

## 2016-11-13 MED ORDER — DIPHENHYDRAMINE HCL 50 MG/ML IJ SOLN
12.5000 mg | Freq: Once | INTRAMUSCULAR | Status: AC
Start: 2016-11-13 — End: 2016-11-13
  Administered 2016-11-13: 12.5 mg via INTRAVENOUS

## 2016-11-13 MED ORDER — AZITHROMYCIN 250 MG PO TABS: ORAL_TABLET | ORAL | 0 refills | Status: DC

## 2016-11-13 MED ORDER — KETOROLAC TROMETHAMINE 30 MG/ML IJ SOLN
30.0000 mg | Freq: Once | INTRAMUSCULAR | Status: AC
  Administered 2016-11-13: 30 mg via INTRAVENOUS
  Filled 2016-11-13: qty 1

## 2016-11-13 NOTE — ED Provider Notes (Addendum)
[facility] Emergency Department Provider Note   ____________________________________________   First MD Initiated Contact with Patient 11/13/16 [phone]     (approximate)  I have reviewed the triage vital signs and the nursing notes.   HISTORY  Chief Complaint Emesis; Generalized Body Aches; Headache; and Cough    HPI Amber [name] is a 36 y.o. female who presents to the ED from home with a chief complaint of fever, body aches, vomiting, diarrhea. Onset of symptoms yesterday. Complains of nonproductive cough, sore throat, generalized myalgias, vomiting and diarrhea. Denies associated chest pain, shortness of breath, abdominal or back pain. Denies dysuria. Currently on her menstrual cycle. Denies recent travel or trauma. Nothing makes her symptoms better or worse. Denies tick exposure.  Past Medical History:  Diagnosis Date  . Anemia   . Congenital malrotation of intestine   . GERD (gastroesophageal reflux disease)    OCC  . Headache    H/O  . Obesity (BMI 30-39.9)   . OSA (obstructive sleep apnea)    USE CPAP    Patient Active Problem List   Diagnosis Date Noted  . Tingling in extremities 10/22/2016  . Anxiety and depression 04/02/2016  . Obesity (BMI 30-39.9)     Past Surgical History:  Procedure Laterality Date  . BREAST REDUCTION SURGERY  12/18/02  . CESAREAN SECTION    . CESAREAN SECTION WITH BILATERAL TUBAL LIGATION N/A 08/04/2015   Procedure: CESAREAN SECTION WITH BILATERAL TUBAL LIGATION;  Surgeon: Nadara Mustard, MD;  Location: [facility] ORS;  Service: Obstetrics;  Laterality: N/A;  . LAPAROSCOPIC CHOLECYSTECTOMY W/ CHOLANGIOGRAPHY  01/22/11  . LESION REMOVAL N/A 05/04/2016   Procedure: EXCISION VAGINAL LESION;  Surgeon: Nadara Mustard, MD;  Location: [facility] ORS;  Service: Gynecology;  Laterality: N/A;  . NASAL SINUS SURGERY  05/26/03  . SLEEVE GASTROPLASTY    . TUBAL LIGATION      Prior to Admission medications   Medication Sig Start Date  End Date Taking? Authorizing Provider  azithromycin (ZITHROMAX Z-PAK) 250 MG tablet 2 tablets day 1, then 1 tablet daily for days 2-5 11/13/16   Irean Hong, MD  FLUoxetine (PROZAC) 20 MG tablet Take 1 tablet (20 mg total) by mouth daily. 10/22/16   Doreene Nest, NP  Multiple Vitamin (MULTI-VITAMINS) TABS Take by mouth.    [provider]  ondansetron (ZOFRAN ODT) 4 MG disintegrating tablet Take 1 tablet (4 mg total) by mouth every 8 (eight) hours as needed for nausea or vomiting. 11/13/16   Irean Hong, MD    Allergies Penicillins; Wellbutrin [bupropion]; and Sulfa antibiotics  Family History  Problem Relation Age of Onset  . Hypertension Mother   . Leukemia Maternal Grandmother   . Diabetes Maternal Grandfather     Social History Social History  Substance Use Topics  . Smoking status: Former Smoker    Packs/day: 0.50    Years: 2.00    Types: Cigarettes    Quit date: 01/05/2003  . Smokeless tobacco: Never Used  . Alcohol use No     Comment: "occasionally"    Review of Systems  Constitutional: Positive for fever/chills. Eyes: No visual changes. ENT: Positive for sore throat. Cardiovascular: Denies chest pain. Respiratory: Positive for cough. Denies shortness of breath. Gastrointestinal: No abdominal pain.  Positive for nausea, vomiting and diarrhea.  No constipation. Genitourinary: Negative for dysuria. Musculoskeletal: Negative for back pain. Skin: Negative for rash. Neurological: Negative for headaches, focal weakness or numbness.   ____________________________________________  PHYSICAL EXAM:  VITAL SIGNS: ED Triage Vitals  Enc Vitals Group     BP 11/12/16 2345 (!) 104/53     Pulse Rate 11/12/16 2345 (!) 114     Resp 11/12/16 2345 20     Temp 11/12/16 2345 (!) 101.1 F (38.4 C)     Temp Source 11/12/16 2345 Oral     SpO2 11/12/16 2345 97 %     Weight --      Height --      Head Circumference --      Peak Flow --      Pain Score 11/12/16  2344 6     Pain Loc --      Pain Edu? --      Excl. in GC? --     Constitutional: Alert and oriented. Well appearing and in mild acute distress. Eyes: Conjunctivae are normal. PERRL. EOMI. Head: Atraumatic. Nose: No congestion/rhinnorhea. Mouth/Throat: Mucous membranes are mildly dry.  Oropharynx mildly erythematous without tonsillar swelling, exudates or peritonsillar abscess. There is no hoarse or muffled voice. There is no drooling. Neck: No stridor.  Supple neck without meningismus. Hematological/Lymphatic/Immunilogical: No cervical lymphadenopathy. Cardiovascular: Normal rate, regular rhythm. Grossly normal heart sounds.  Good peripheral circulation. Respiratory: Normal respiratory effort.  No retractions. Lungs CTAB. Gastrointestinal: Soft and nontender to light and deep palpation. No distention. No abdominal bruits. No CVA tenderness. Musculoskeletal: No lower extremity tenderness nor edema.  No joint effusions. Neurologic:  Normal speech and language. No gross focal neurologic deficits are appreciated.  Skin:  Skin is warm, dry and intact. No rash noted. No petechiae. Psychiatric: Mood and affect are normal. Speech and behavior are normal.  ____________________________________________   LABS (all labs ordered are listed, but only abnormal results are displayed)  Labs Reviewed  CBC WITH DIFFERENTIAL/PLATELET - Abnormal; Notable for the following:       Result Value   RBC 5.41 (*)    MCV 71.7 (*)    MCH 23.0 (*)    RDW 24.2 (*)    Neutro Abs 8.0 (*)    Lymphs Abs 0.2 (*)    All other components within normal limits  COMPREHENSIVE METABOLIC PANEL - Abnormal; Notable for the following:    Glucose, Bld 141 (*)    BUN 23 (*)    Calcium 8.1 (*)    Alkaline Phosphatase 34 (*)    All other components within normal limits  URINALYSIS, ROUTINE W REFLEX MICROSCOPIC - Abnormal; Notable for the following:    Color, Urine RED (*)    APPearance   (*)    Value: TEST NOT REPORTED  DUE TO COLOR INTERFERENCE OF URINE PIGMENT   Glucose, UA   (*)    Value: TEST NOT REPORTED DUE TO COLOR INTERFERENCE OF URINE PIGMENT   Hgb urine dipstick   (*)    Value: TEST NOT REPORTED DUE TO COLOR INTERFERENCE OF URINE PIGMENT   Bilirubin Urine   (*)    Value: TEST NOT REPORTED DUE TO COLOR INTERFERENCE OF URINE PIGMENT   Ketones, ur   (*)    Value: TEST NOT REPORTED DUE TO COLOR INTERFERENCE OF URINE PIGMENT   Protein, ur   (*)    Value: TEST NOT REPORTED DUE TO COLOR INTERFERENCE OF URINE PIGMENT   Nitrite   (*)    Value: TEST NOT REPORTED DUE TO COLOR INTERFERENCE OF URINE PIGMENT   Leukocytes, UA   (*)    Value: TEST NOT REPORTED DUE TO COLOR INTERFERENCE  OF URINE PIGMENT   Bacteria, UA MANY (*)    Squamous Epithelial / LPF TOO NUMEROUS TO COUNT (*)    Crystals PRESENT (*)    All other components within normal limits  PREGNANCY, URINE  INFLUENZA PANEL BY PCR (TYPE A & B)  POCT RAPID STREP A   ____________________________________________  EKG  ED ECG REPORT I, SUNG,JADE J, the attending physician, personally viewed and interpreted this ECG.   Date: 11/13/2016  EKG Time: 0025  Rate: 112  Rhythm: sinus tachycardia  Axis: Normal  Intervals:none  ST&T Change: Nonspecific  ____________________________________________  RADIOLOGY  Chest 2 view interpreted per Dr. Andria Meuse: No active cardiopulmonary disease. ____________________________________________   PROCEDURES  Procedure(s) performed: None  Procedures  Critical Care performed: No  ____________________________________________   INITIAL IMPRESSION / ASSESSMENT AND PLAN / ED COURSE  Pertinent labs & imaging results that were available during my care of the patient were reviewed by me and considered in my medical decision making (see chart for details).  35 year old female who presents with fever, sore throat, cough, vomiting and diarrhea. Rechecked temperature which is currently 98.62F orally. She was  given Tylenol in triage. Laboratory results remarkable for normal WBC, electrolytes. Urinalysis with color interference most likely secondary to menstrual cycle as patient is not currently taking AZO or other urinary analgesics which might stay in the color of her urine. Will infuse IV fluids, NSAIDs. Check influenza, rapid strep and chest x-ray.  Clinical Course as of Nov 13 709  Tue Nov 13, 2016  0602 Patient resting comfortably, overall feeling better. Updated patient and spouse of negative strep and influenza as well as negative chest x-ray. Will place on Z-Pak, ODT Zofran as needed and patient will follow-up with her PCP closely. Of note, patient developed hives after Toradol administration and received Benadryl. Hives resolved at this time. There is no angioedema or airway distress. Strict return precautions given. Both verbalize understanding and agree with plan of care.  [JS]    Clinical Course User Index [JS] Irean Hong, MD     ____________________________________________   FINAL CLINICAL IMPRESSION(S) / ED DIAGNOSES  Final diagnoses:  Fever, unspecified fever cause  Viral syndrome  Lower urinary tract infectious disease      NEW MEDICATIONS STARTED DURING THIS VISIT:  Discharge Medication List as of 11/13/2016  6:07 AM    START taking these medications   Details  azithromycin (ZITHROMAX Z-PAK) 250 MG tablet 2 tablets day 1, then 1 tablet daily for days 2-5, Print    ondansetron (ZOFRAN ODT) 4 MG disintegrating tablet Take 1 tablet (4 mg total) by mouth every 8 (eight) hours as needed for nausea or vomiting., Starting Tue 11/13/2016, Print         Note:  This document was prepared using Dragon voice recognition software and may include unintentional dictation errors.    Irean Hong, MD 11/13/16 1610    Irean Hong, MD 11/13/16 [phone]

## 2016-11-13 NOTE — Discharge Instructions (Signed)
1. Take antibiotic as prescribed (Z-Pak). 2. You may take nausea medicine as needed (Zofran #20). 3. Drink plenty of fluids daily. 4. Return to the ER for worsening symptoms, persistent vomiting, difficulty breathing or other symptoms.

## 2016-12-04 ENCOUNTER — Ambulatory Visit: Payer: 59 | Admitting: Primary Care

## 2017-01-17 IMAGING — CT CT HEAD W/O CM
1 series · 16 of 30 positions shown, 20 images · non-contrast
Comparison: None.

CLINICAL DATA: 34-year-old presenting with acute onset of headache
3 days ago which has persisted despite over the counter medication.
Eight days postpartum after Caesarean section. Current history of
migraines.

EXAM:
CT HEAD WITHOUT CONTRAST
TECHNIQUE: Contiguous axial images were obtained from the base of the skull
through the vertex without intravenous contrast.

[Series 2: head wo · axial · 0.42mm/px · z∈[-166,-26]mm · 16 of 32 slices shown, 20 images]
[im 2/32  brain]
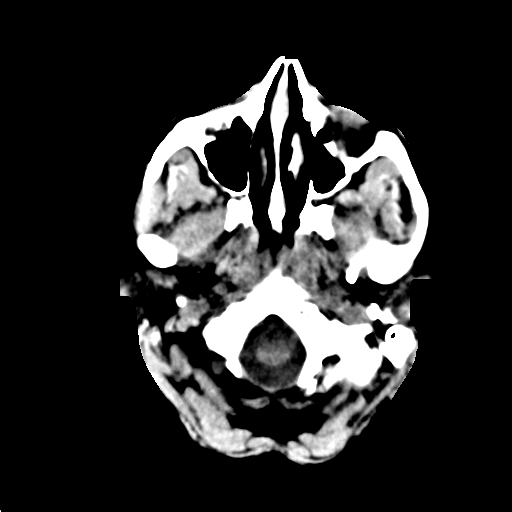
[im 2/32  bone]
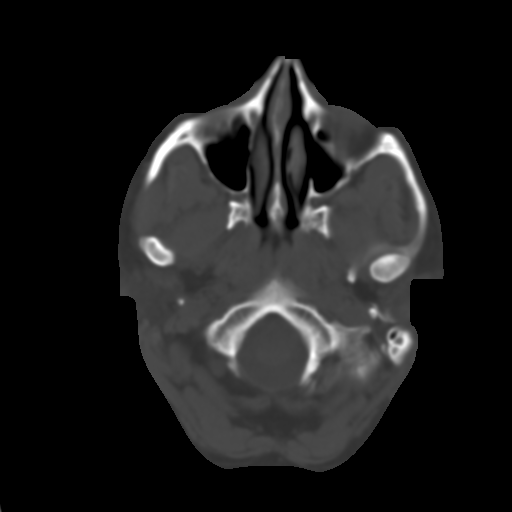
[im 4/32  brain]
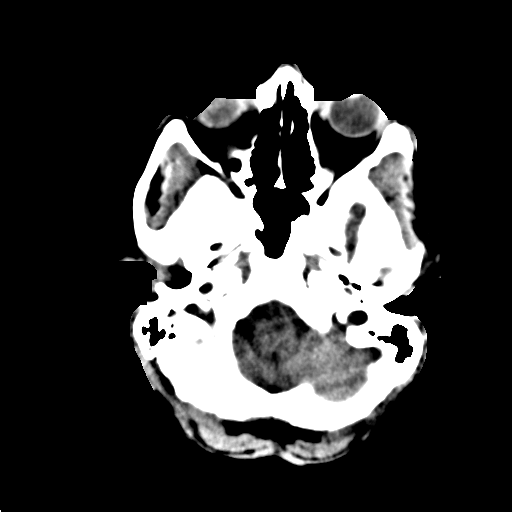
[im 6/32  brain]
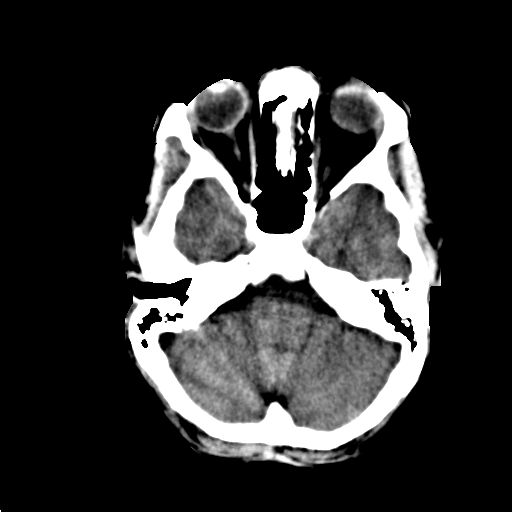
[im 8/32  brain]
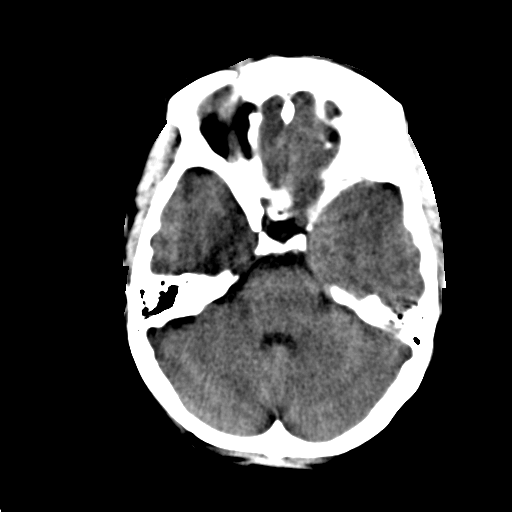
[im 9/32  brain]
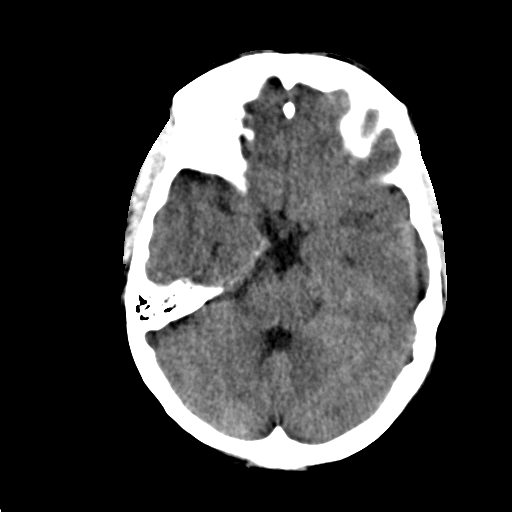
[im 9/32  bone]
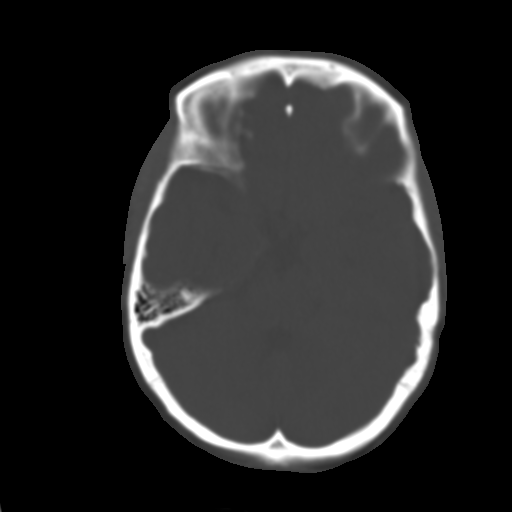
[im 11/32  brain]
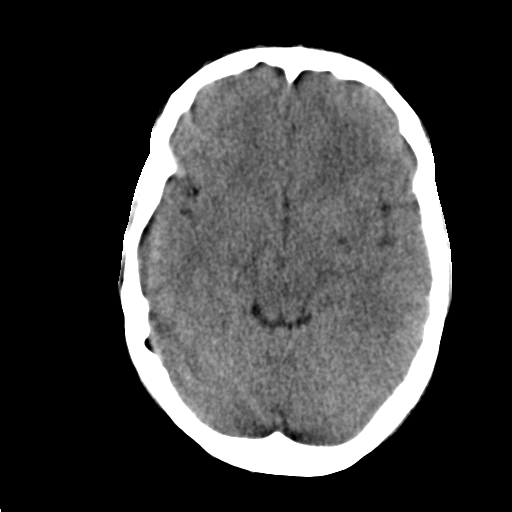
[im 13/32  brain]
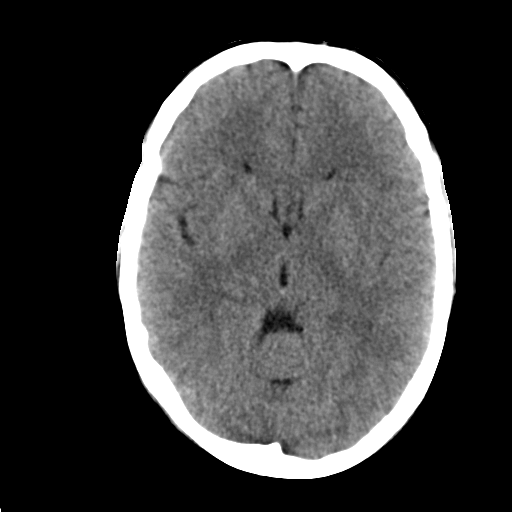
[im 15/32  brain]
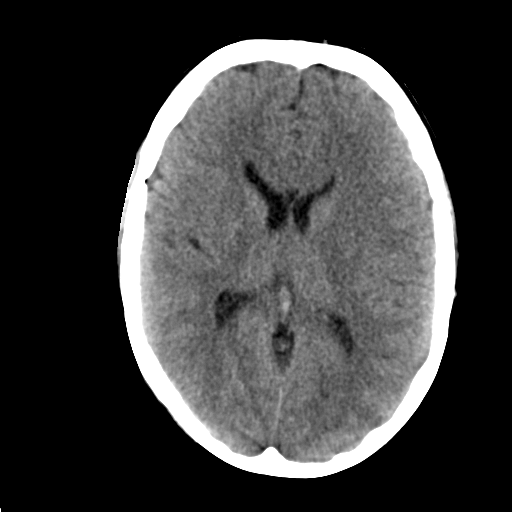
[im 17/32  brain]
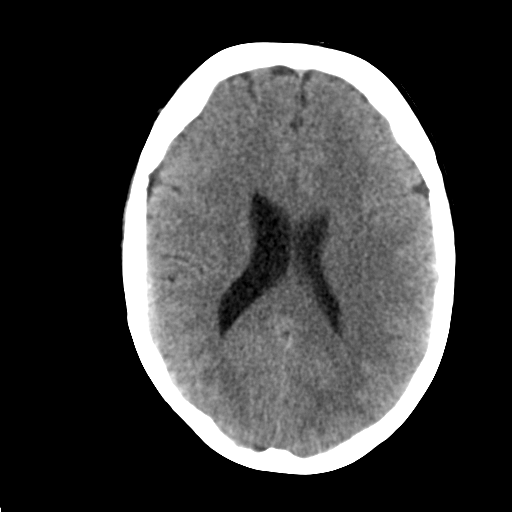
[im 17/32  bone]
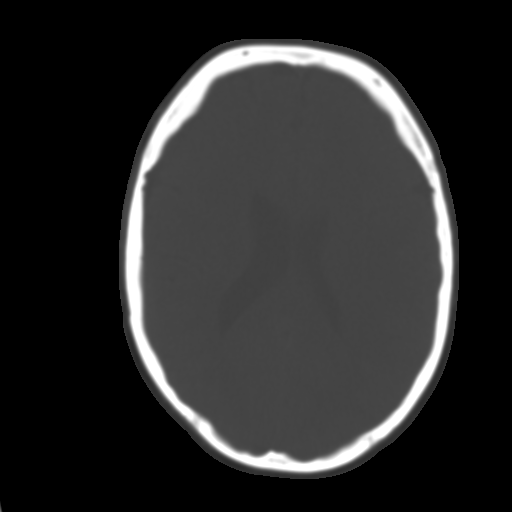
[im 19/32  brain]
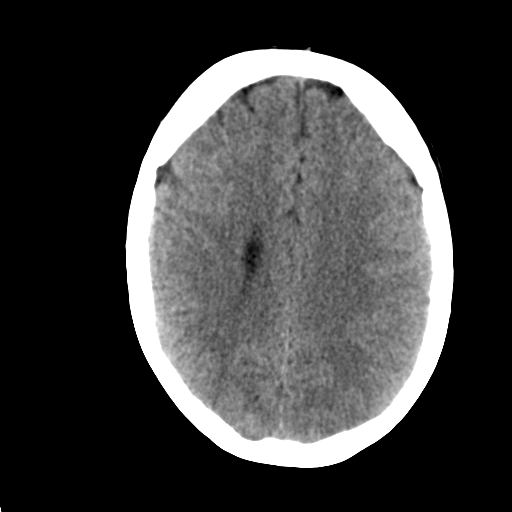
[im 21/32  brain]
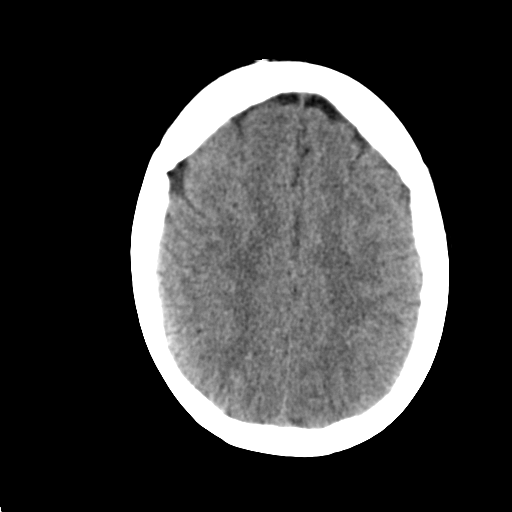
[im 23/32  brain]
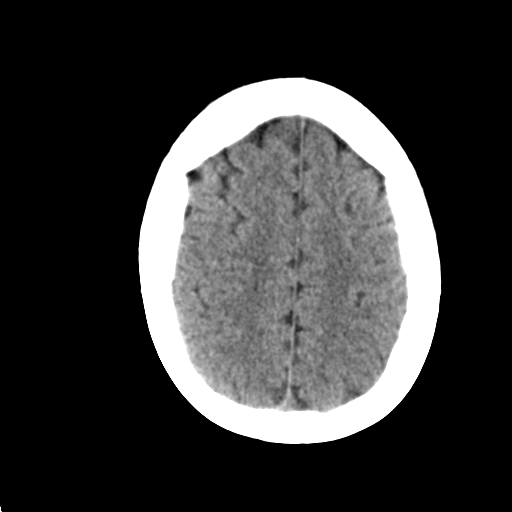
[im 24/32  brain]
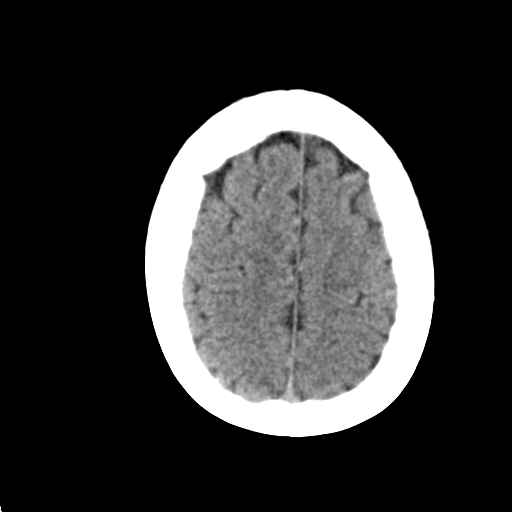
[im 24/32  bone]
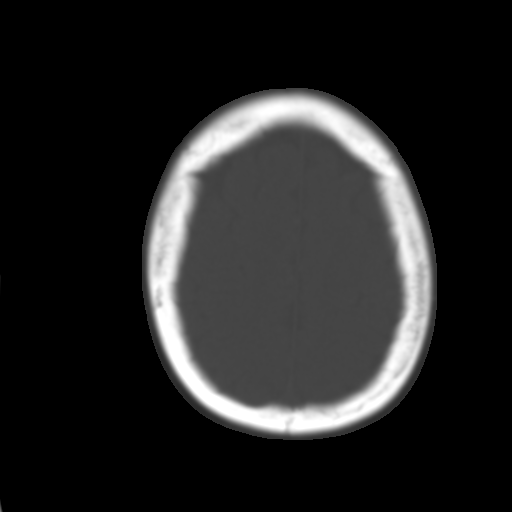
[im 26/32  brain]
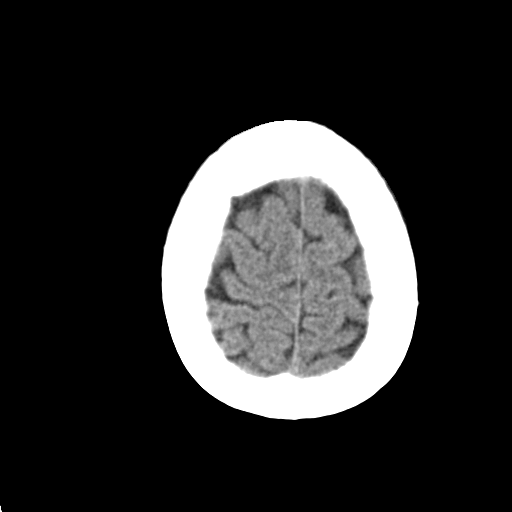
[im 28/32  brain]
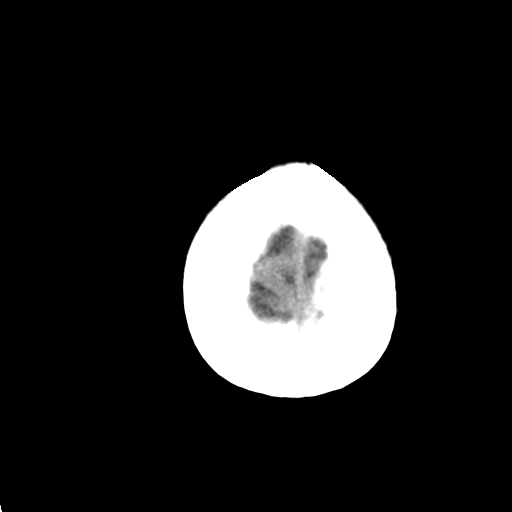
[im 30/32  brain]
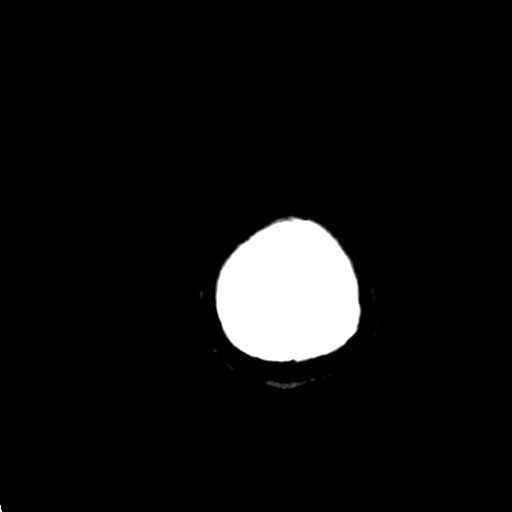

[16 of 30 positions shown; findings below may reference images not displayed]

FINDINGS: Ventricular system normal in size and appearance for age. Asymmetry
in the lateral ventricles, right greater than left, is felt to be
developmental in origin. Small cavum septum lucent. No mass lesion.
No midline shift. No acute hemorrhage or hematoma. No extra-axial
fluid collections. No evidence of acute infarction. No focal brain
parenchymal abnormalities.

No focal osseous abnormalities involving the skull. Visualized
paranasal sinuses, bilateral mastoid air cells, and bilateral middle
ear cavities well-aerated.
IMPRESSION: No acute or significant intracranial abnormality.

## 2017-02-13 ENCOUNTER — Telehealth: Payer: Self-pay | Admitting: Primary Care

## 2017-02-13 DIAGNOSIS — D509 Iron deficiency anemia, unspecified: Secondary | ICD-10-CM

## 2017-02-13 DIAGNOSIS — Z0000 Encounter for general adult medical examination without abnormal findings: Secondary | ICD-10-CM

## 2017-02-13 NOTE — Addendum Note (Signed)
Addended by: Alvina Chou on: 02/13/2017 06:11 PM   Modules accepted: Orders

## 2017-02-13 NOTE — Telephone Encounter (Signed)
Patient work at [employer]. Will send to [place] to see what labs are needed.   Will release and fax to [employer] when [facility] send back.

## 2017-02-13 NOTE — Telephone Encounter (Signed)
Noted, lab orders placed. 

## 2017-02-13 NOTE — Telephone Encounter (Signed)
Orders have been release to [employer] and faxed to the number below.

## 2017-02-13 NOTE — Telephone Encounter (Signed)
Pt is sch for physical 03/01/17 and she needs orders for fasting labs. She needs them faxed [phone] by 02/27/17. Please call pt with any questions.

## 2017-02-26 NOTE — Telephone Encounter (Signed)
Pt stated she didn't received orders. Please refax [phone] She has labs tomorrow

## 2017-02-26 NOTE — Telephone Encounter (Signed)
Pt called back they have not received order.  She wanted spouse to pick up order so she can have labs tomorrow. Spouse would like to pick this up around 2

## 2017-02-26 NOTE — Telephone Encounter (Signed)
Re-faxed the orders to [phone]

## 2017-02-28 LAB — COMPREHENSIVE METABOLIC PANEL
ALK PHOS: 45 IU/L (ref 39–117)
ALT: 12 IU/L (ref 0–32)
AST: 15 IU/L (ref 0–40)
Albumin/Globulin Ratio: 1.9 (ref 1.2–2.2)
Albumin: 4.6 g/dL (ref 3.5–5.5)
BILIRUBIN TOTAL: 0.8 mg/dL (ref 0.0–1.2)
BUN / CREAT RATIO: 14 (ref 9–23)
BUN: 11 mg/dL (ref 6–20)
CHLORIDE: 102 mmol/L (ref 96–106)
CO2: 23 mmol/L (ref 20–29)
Calcium: 9.3 mg/dL (ref 8.7–10.2)
Creatinine, Ser: 0.76 mg/dL (ref 0.57–1.00)
GFR calc Af Amer: 115 mL/min/{1.73_m2} (ref >59)
GFR calc non Af Amer: 100 mL/min/{1.73_m2} (ref >59)
GLOBULIN, TOTAL: 2.4 g/dL (ref 1.5–4.5)
Glucose: 87 mg/dL (ref 65–99)
POTASSIUM: 4.4 mmol/L (ref 3.5–5.2)
SODIUM: 139 mmol/L (ref 134–144)
Total Protein: 7 g/dL (ref 6.0–8.5)

## 2017-02-28 LAB — CBC
Hematocrit: 38.3 % (ref 34.0–46.6)
Hemoglobin: 12 g/dL (ref 11.1–15.9)
MCH: 24.6 pg — ABNORMAL LOW (ref 26.6–33.0)
MCHC: 31.3 g/dL — ABNORMAL LOW (ref 31.5–35.7)
MCV: 79 fL (ref 79–97)
Platelets: 268 10*3/uL (ref 150–379)
RBC: 4.88 x10E6/uL (ref 3.77–5.28)
RDW: 17 % — AB (ref 12.3–15.4)
WBC: 6.4 10*3/uL (ref 3.4–10.8)

## 2017-02-28 LAB — HEMOGLOBIN A1C
ESTIMATED AVERAGE GLUCOSE: 108 mg/dL
HEMOGLOBIN A1C: 5.4 % (ref 4.8–5.6)

## 2017-02-28 LAB — LIPID PANEL
CHOLESTEROL TOTAL: 165 mg/dL (ref 100–199)
Chol/HDL Ratio: 3.1 ratio (ref 0.0–4.4)
HDL: 54 mg/dL (ref >39)
LDL CALC: 83 mg/dL (ref 0–99)
Triglycerides: 142 mg/dL (ref 0–149)
VLDL CHOLESTEROL CAL: 28 mg/dL (ref 5–40)

## 2017-02-28 LAB — IRON AND TIBC
Iron Saturation: 18 % (ref 15–55)
Iron: 72 ug/dL (ref 27–159)
Total Iron Binding Capacity: 408 ug/dL (ref 250–450)
UIBC: 336 ug/dL (ref 131–425)

## 2017-02-28 LAB — TRANSFERRIN: Transferrin: 320 mg/dL (ref 200–370)

## 2017-03-01 ENCOUNTER — Encounter: Payer: Self-pay | Admitting: Primary Care

## 2017-03-01 ENCOUNTER — Ambulatory Visit (INDEPENDENT_AMBULATORY_CARE_PROVIDER_SITE_OTHER): Payer: 59 | Admitting: Primary Care

## 2017-03-01 VITALS — BP 104/72 | HR 85 | Temp 98.5°F | Resp 16 | Ht 62.0 in | Wt 210.8 lb

## 2017-03-01 DIAGNOSIS — F329 Major depressive disorder, single episode, unspecified: Secondary | ICD-10-CM

## 2017-03-01 DIAGNOSIS — Z0000 Encounter for general adult medical examination without abnormal findings: Secondary | ICD-10-CM | POA: Diagnosis not present

## 2017-03-01 DIAGNOSIS — F419 Anxiety disorder, unspecified: Secondary | ICD-10-CM | POA: Diagnosis not present

## 2017-03-01 DIAGNOSIS — E669 Obesity, unspecified: Secondary | ICD-10-CM

## 2017-03-01 DIAGNOSIS — IMO0001 Reserved for inherently not codable concepts without codable children: Secondary | ICD-10-CM

## 2017-03-01 MED ORDER — FLUOXETINE HCL 10 MG PO TABS: 10.0000 mg | ORAL_TABLET | Freq: Every day | ORAL | 0 refills | Status: DC

## 2017-03-01 NOTE — Patient Instructions (Addendum)
We've reduced your fluoxetine to 10 mg. Please call me if you continue to notice the side effect we discussed.  Start exercising. You should be getting 150 minutes of moderate intensity exercise weekly.  It's important to improve your diet by reducing consumption of fast food, fried food, processed snack foods, sugary drinks. Increase consumption of fresh vegetables and fruits, whole grains, water.  Ensure you are drinking 64 ounces of water daily.  Follow up in 1 year for your annual exam or sooner if needed.  It was a pleasure to see you today!

## 2017-03-01 NOTE — Assessment & Plan Note (Signed)
Improved on fluoxetine, however, decrease in sexual drive. Will have patient reduce dose to 10 mg for now. She will update in 3-4 weeks if her sexual drive doesn't improve on reduced dose.

## 2017-03-01 NOTE — Assessment & Plan Note (Signed)
Recommended to increase vegetables, fruit, whole grains, water. Discussed to start exercising. Health form for occupation completed.

## 2017-03-01 NOTE — Progress Notes (Signed)
Subjective:    Patient ID: Amber [name], female    DOB: 12-10-1980, 36 y.o.   MRN: [id number]  HPI  [name] is a 36 year old female who presents today for complete physical.  1) Anxiety and Depression: Improved on Fluoxetine 20 mg. Positive effects of the Prozac include decrease in anxiety and depression. She has noticed a decrease in her sexual drive though which is affecting her personal life. Overall she feels as though the Prozac has helped whereas Zoloft was ineffective. Denies SI/HI.  Immunizations: -Tetanus: Unsure, believes it's been within 10 years.  -Influenza: Will get one at work.   Diet: She endorses a fair diet. She is thinking about going on a low carbohydrate.  Breakfast: [place] and cheese bagel Lunch: Left over, frozen dinner Dinner: Meat, vegetable, rice Snacks: Popcorn Desserts: 2-3 times weekly Beverages: Water with flavor, energy drink (sugar free), power aid  Exercise: Does not currently regularly exercise. Eye exam: Has not completed recently. Dental exam: Completes annually  Pap Smear: Following with GYN.     Review of Systems  Constitutional: Negative for unexpected weight change.  HENT: Negative for rhinorrhea.   Respiratory: Negative for cough and shortness of breath.   Cardiovascular: Negative for chest pain.  Gastrointestinal: Negative for constipation and diarrhea.  Genitourinary: Negative for difficulty urinating and menstrual problem.  Musculoskeletal: Negative for arthralgias and myalgias.  Skin: Negative for rash.  Allergic/Immunologic: Negative for environmental allergies.  Neurological: Negative for dizziness, numbness and headaches.  Psychiatric/Behavioral:       Anxiety and depression improved on Prozac, does cause decrease in sexual drive.       Past Medical History:  Diagnosis Date  . Anemia   . Congenital malrotation of intestine   . GERD (gastroesophageal reflux disease)    OCC  . Headache    H/O  . Obesity (BMI  30-39.9)   . OSA (obstructive sleep apnea)    USE CPAP     Social History   Social History  . Marital status: Married    Spouse name: N/A  . Number of children: N/A  . Years of education: N/A   Occupational History  . Not on file.   Social History Main Topics  . Smoking status: Former Smoker    Packs/day: 0.50    Years: 2.00    Types: Cigarettes    Quit date: 01/05/2003  . Smokeless tobacco: Never Used  . Alcohol use No     Comment: "occasionally"  . Drug use: No  . Sexual activity: Yes    Birth control/ protection: None   Other Topics Concern  . Not on file   Social History Narrative   Married.   2 children.   Works in Clinical biochemistcustomer service.   Enjoys spending time with family.    Past Surgical History:  Procedure Laterality Date  . BREAST REDUCTION SURGERY  12/18/02  . CESAREAN SECTION    . CESAREAN SECTION WITH BILATERAL TUBAL LIGATION N/A 08/04/2015   Procedure: CESAREAN SECTION WITH BILATERAL TUBAL LIGATION;  Surgeon: Nadara Mustardobert P Harris, MD;  Location: [facility] ORS;  Service: Obstetrics;  Laterality: N/A;  . LAPAROSCOPIC CHOLECYSTECTOMY W/ CHOLANGIOGRAPHY  01/22/11  . LESION REMOVAL N/A 05/04/2016   Procedure: EXCISION VAGINAL LESION;  Surgeon: Nadara Mustardobert P Harris, MD;  Location: [facility] ORS;  Service: Gynecology;  Laterality: N/A;  . NASAL SINUS SURGERY  05/26/03  . SLEEVE GASTROPLASTY    . TUBAL LIGATION      Family History  Problem Relation  Age of Onset  . Hypertension Mother   . Leukemia Maternal Grandmother   . Diabetes Maternal Grandfather     Allergies  Allergen Reactions  . Penicillins Hives and Swelling    Has patient had a PCN reaction causing immediate rash, facial/tongue/throat swelling, SOB or lightheadedness with hypotension:No  Has patient had a PCN reaction causing severe rash involving mucus membranes or skin necrosis:Yes Has patient had a PCN reaction that required hospitalization:No Has patient had a PCN reaction occurring within the last 10  years:No If all of the above answers are "NO", then may proceed with Cephalosporin use.   . Wellbutrin [Bupropion] Other (See Comments)    Headaches  . Sulfa Antibiotics Hives and Swelling    Current Outpatient Prescriptions on File Prior to Visit  Medication Sig Dispense Refill  . Multiple Vitamin (MULTI-VITAMINS) TABS Take by mouth.     No current facility-administered medications on file prior to visit.     BP 104/72   Pulse 85   Temp 98.5 F (36.9 C) (Oral)   Resp 16   Ht  (1.575 m)   Wt 210 lb 12.8 oz (95.6 kg)   LMP 03/01/2017   SpO2 98%   BMI 38.56 kg/m    Objective:   Physical Exam  Constitutional: She is oriented to person, place, and time. She appears well-nourished.  HENT:  Right Ear: Tympanic membrane and ear canal normal.  Left Ear: Tympanic membrane and ear canal normal.  Nose: Nose normal.  Mouth/Throat: Oropharynx is clear and moist.  Eyes: Pupils are equal, round, and reactive to light. Conjunctivae and EOM are normal.  Neck: Neck supple. No thyromegaly present.  Cardiovascular: Normal rate and regular rhythm.   No murmur heard. Pulmonary/Chest: Effort normal and breath sounds normal. She has no rales.  Abdominal: Soft. Bowel sounds are normal. There is no tenderness.  Musculoskeletal: Normal range of motion.  Lymphadenopathy:    She has no cervical adenopathy.  Neurological: She is alert and oriented to person, place, and time. She has normal reflexes. No cranial nerve deficit.  Skin: Skin is warm and dry. No rash noted.  Psychiatric: She has a normal mood and affect.          Assessment & Plan:

## 2017-03-01 NOTE — Assessment & Plan Note (Signed)
Immunizations UTD per patient. PAP UTD, following with GYN. Discussed the importance of a healthy diet and regular exercise in order for weight loss, and to reduce the risk of other medical problems. Exam unremarkable. Labs unremarkable. Follow up in 1 year.

## 2017-05-27 ENCOUNTER — Encounter: Payer: Self-pay | Admitting: Obstetrics and Gynecology

## 2017-05-27 ENCOUNTER — Ambulatory Visit (INDEPENDENT_AMBULATORY_CARE_PROVIDER_SITE_OTHER): Payer: 59 | Admitting: Obstetrics and Gynecology

## 2017-05-27 VITALS — BP 130/70 | HR 86 | Ht 61.5 in | Wt 213.0 lb

## 2017-05-27 DIAGNOSIS — Z30011 Encounter for initial prescription of contraceptive pills: Secondary | ICD-10-CM

## 2017-05-27 DIAGNOSIS — N920 Excessive and frequent menstruation with regular cycle: Secondary | ICD-10-CM | POA: Diagnosis not present

## 2017-05-27 MED ORDER — NORETHIN-ETH ESTRAD-FE BIPHAS 1 MG-10 MCG / 10 MCG PO TABS: 1.0000 | ORAL_TABLET | Freq: Every day | ORAL | 1 refills | Status: DC

## 2017-05-27 NOTE — Progress Notes (Signed)
Chief Complaint  Patient presents with  . Menstrual Problem    painful, heavy periods    HPI:      [name] is a 36 y.o. G2P2001 who LMP was Patient's last menstrual period was 05/20/2017 (exact date)., presents today for menorrhagia f/u. Pt seen for same sx 3/18 with neg u/s and labs, except anemia. Pt's menses still monthly, lasting 7 days, changing heavy pad/regular tampon Q2 hrs. With quarter sized clots and dysmen, improved with ibup. No BTB. Pt was given lysteda 3/18 and states it made her nauseated and gave her headaches, no real change with flow either.   She is s/p TL. She did OCPs in the distant past and isn't really interested in OCPs again. No hx of DVTs/HTN/tob use. She has menstrual migraines with and without aura, but doesn't have migraines otherwise.    Past Medical History:  Diagnosis Date  . Anemia   . Anxiety   . Congenital malrotation of intestine   . Depression   . GERD (gastroesophageal reflux disease)    OCC  . Headache    H/O  . Obesity (BMI 30-39.9)   . OSA (obstructive sleep apnea)    USE CPAP    Past Surgical History:  Procedure Laterality Date  . BREAST REDUCTION SURGERY  12/18/02  . CESAREAN SECTION    . CESAREAN SECTION WITH BILATERAL TUBAL LIGATION N/A 08/04/2015   Procedure: CESAREAN SECTION WITH BILATERAL TUBAL LIGATION;  Surgeon: Nadara Mustardobert P Harris, MD;  Location: [facility] OR[facility];  Service: Obstetrics;  Laterality: N/A;  . LAPAROSCOPIC CHOLECYSTECTOMY W/ CHOLANGIOGRAPHY  01/22/11  . LESION REMOVAL N/A 05/04/2016   Procedure: EXCISION VAGINAL LESION;  Surgeon: Nadara Mustardobert P Harris, MD;  Location: [facility] ORS;  Service: Gynecology;  Laterality: N/A;  . NASAL SINUS SURGERY  05/26/03  . SLEEVE GASTROPLASTY    . TUBAL LIGATION      Family History  Problem Relation Age of Onset  . Hypertension Mother   . Diabetes Mother   . Leukemia Maternal Grandmother 40  . Diabetes Maternal Grandfather     Social History   Socioeconomic History  . Marital  status: Married    Spouse name: Not on file  . Number of children: Not on file  . Years of education: Not on file  . Highest education level: Not on file  Social Needs  . Financial resource strain: Not on file  . Food insecurity - worry: Not on file  . Food insecurity - inability: Not on file  . Transportation needs - medical: Not on file  . Transportation needs - non-medical: Not on file  Occupational History  . Not on file  Tobacco Use  . Smoking status: Former Smoker    Packs/day: 0.50    Years: 2.00    Pack years: 1.00    Types: Cigarettes    Last attempt to quit: 01/05/2003    Years since quitting: 14.4  . Smokeless tobacco: Never Used  Substance and Sexual Activity  . Alcohol use: No    Alcohol/week: 0.0 oz    Comment: "occasionally"  . Drug use: No  . Sexual activity: Yes    Birth control/protection: None  Other Topics Concern  . Not on file  Social History Narrative   Married.   2 children.   Works in Clinical biochemistcustomer service.   Enjoys spending time with family.     Current Outpatient Medications:  Marland Kitchen.  Multiple Vitamin (MULTI-VITAMINS) TABS, Take by mouth., Disp: , Rfl:  .  Norethindrone-Ethinyl Estradiol-Fe Biphas (LO LOESTRIN FE) 1 MG-10 MCG / 10 MCG tablet, Take 1 tablet by mouth daily., Disp: 28 tablet, Rfl: 1   ROS:  Review of Systems  Constitutional: Negative for fever.  Gastrointestinal: Negative for blood in stool, constipation, diarrhea, nausea and vomiting.  Genitourinary: Positive for menstrual problem. Negative for dyspareunia, dysuria, flank pain, frequency, hematuria, urgency, vaginal bleeding, vaginal discharge and vaginal pain.  Musculoskeletal: Positive for arthralgias. Negative for back pain.  Skin: Negative for rash.     OBJECTIVE:   Vitals:  BP 130/70   Pulse 86   Ht 5' 1.5" (1.562 m)   Wt 213 lb (96.6 kg)   LMP 05/20/2017 (Exact Date)   BMI 39.59 kg/m   Physical Exam  Constitutional: She is oriented to person, place, and time and  well-developed, well-nourished, and in no distress. Vital signs are normal.  Genitourinary: Vagina normal, uterus normal, cervix normal, right adnexa normal, left adnexa normal and vulva normal. Uterus is not enlarged. Cervix exhibits no motion tenderness and no tenderness. Right adnexum displays no mass and no tenderness. Left adnexum displays no mass and no tenderness. Vulva exhibits no erythema, no exudate, no lesion, no rash and no tenderness. Vagina exhibits no lesion.  Neurological: She is oriented to person, place, and time.  Vitals reviewed.   Assessment/Plan: Menorrhagia with regular cycle - Neg eval 3/18. Discussed IUD, BC options, ablation, hyst. Pt interested in trying OCPs again. Uncertain about IUD, ablation, doesn't want hyst. - Plan: Norethindrone-Ethinyl Estradiol-Fe Biphas (LO LOESTRIN FE) 1 MG-10 MCG / 10 MCG tablet  Encounter for initial prescription of contraceptive pills - 2 sample Lo Loestrin given to pt. Start today. RTO in 2 months for annual/sx f/u. - Plan: Norethindrone-Ethinyl Estradiol-Fe Biphas (LO LOESTRIN FE) 1 MG-10 MCG / 10 MCG tablet    Meds ordered this encounter  Medications  . Norethindrone-Ethinyl Estradiol-Fe Biphas (LO LOESTRIN FE) 1 MG-10 MCG / 10 MCG tablet    Sig: Take 1 tablet by mouth daily.    Dispense:  28 tablet    Refill:  1      Return in about 2 months (around 07/28/2017) for annual.  Faruq Rosenberger B. Tilton Marsalis, PA-C 05/27/2017 4:55 PM

## 2017-05-27 NOTE — Patient Instructions (Signed)
I value your feedback and entrusting us with your care. If you get a [facility] patient survey, I would appreciate you taking the time to let us know about your experience today. Thank you! 

## 2017-08-15 ENCOUNTER — Telehealth: Payer: Self-pay | Admitting: Primary Care

## 2017-08-15 NOTE — Telephone Encounter (Signed)
okk if can find availability   TMS

## 2017-08-15 NOTE — Telephone Encounter (Signed)
The patient has been scheduled and is aware of her appointment with Dr. Lonie PeakMS.

## 2017-08-15 NOTE — Telephone Encounter (Signed)
Okay to transfer, thanks!

## 2017-08-15 NOTE — Telephone Encounter (Signed)
See crm  Ok to schedule?  Copied from CRM [phone]. Topic: General - Other >> Aug 14, 2017  4:00 PM Windy KalataMichael, Taylor L, NT wrote: Patient is calling and would like to [facility] to Dr. Judie GrieveMclean-Scocuzza because the drive is easier for her. Please contact patient to schedule if this transfer is okay.

## 2017-08-15 NOTE — Telephone Encounter (Signed)
Erie NoeVanessa If it is ok with Dr McLean-Scocuzza for pt to transfer care, Can you help patient to schedule appointment Thanks

## 2017-08-21 ENCOUNTER — Ambulatory Visit (INDEPENDENT_AMBULATORY_CARE_PROVIDER_SITE_OTHER): Payer: BLUE CROSS/BLUE SHIELD

## 2017-08-21 ENCOUNTER — Ambulatory Visit: Payer: BLUE CROSS/BLUE SHIELD | Admitting: Internal Medicine

## 2017-08-21 ENCOUNTER — Encounter: Payer: Self-pay | Admitting: Internal Medicine

## 2017-08-21 VITALS — BP 126/68 | HR 77 | Temp 98.5°F | Resp 15 | Ht 61.5 in | Wt 211.4 lb

## 2017-08-21 DIAGNOSIS — M25521 Pain in right elbow: Secondary | ICD-10-CM

## 2017-08-21 DIAGNOSIS — M7711 Lateral epicondylitis, right elbow: Secondary | ICD-10-CM

## 2017-08-21 MED ORDER — TRAMADOL HCL 50 MG PO TABS: 50.0000 mg | ORAL_TABLET | Freq: Every evening | ORAL | 0 refills | Status: DC | PRN

## 2017-08-21 NOTE — Patient Instructions (Addendum)
I referred you to  [facility] Dr. Kirtland BouchardK for elbow pain you may need a steroid injection  Please take Ibuprofen max dose 800 mg 3 total pills in 1 day You may need an MRI if you are not better  Try ice 1st then heat  F/u in 1 month with PCP sooner if needed  Tennis Elbow Tennis elbow (lateral epicondylitis) is inflammation of the outer tendons of your forearm close to your elbow. Your tendons attach your muscles to your bones. The outer tendons of your forearm are used to extend your wrist, and they attach on the outside part of your elbow. Tennis elbow is often found in people who play tennis, but anyone may get the condition from repeatedly extending the wrist or turning the forearm. What are the causes? This condition is caused by repeatedly extending your wrist and using your hands. It can result from sports or work that requires repetitive forearm movements. Tennis elbow may also be caused by an injury. What increases the risk? You have a higher risk of developing tennis elbow if you play tennis or another racquet sport. You also have a higher risk if you frequently use your hands for work. This condition is also more likely to develop in:  Musicians.  Carpenters, painters, and plumbers.  Cooks.  Cashiers.  People who work in [employer]ories.  Holiday representativeConstruction workers.  Butchers.  People who use computers.  What are the signs or symptoms? Symptoms of this condition include:  Pain and tenderness in your forearm and the outer part of your elbow. You may only feel the pain when you use your arm, or you may feel it even when you are not using your arm.  A burning feeling that runs from your elbow through your arm.  Weak grip in your hands.  How is this diagnosed? This condition may be diagnosed by medical history and physical exam. You may also have other tests, including:  X-rays.  MRI.  How is this treated? Your health care provider will recommend lifestyle adjustments, such as  resting and icing your arm. Treatment may also include:  Medicines for inflammation. This may include shots of cortisone if your pain continues.  Physical therapy. This may include massage or exercises.  An elbow brace.  Surgery may eventually be recommended if your pain does not go away with treatment. Follow these instructions at home: Activity  Rest your elbow and wrist as directed by your health care provider. Try to avoid any activities that caused the problem until your health care provider says that you can do them again.  If a physical therapist teaches you exercises, do all of them as directed.  If you lift an object, lift it with your palm facing upward. This lowers the stress on your elbow. Lifestyle  If your tennis elbow is caused by sports, check your equipment and make sure that: ? You are using it correctly. ? It is the best fit for you.  If your tennis elbow is caused by work, take breaks frequently, if you are able. Talk with your manager about how to best perform tasks in a way that is safe. ? If your tennis elbow is caused by computer use, talk with your manager about any changes that can be made to your work environment. General instructions  If directed, apply ice to the painful area: ? Put ice in a plastic bag. ? Place a towel between your skin and the bag. ? Leave the ice on for  20 minutes, 2-3 times per day.  Take medicines only as directed by your health care provider.  If you were given a brace, wear it as directed by your health care provider.  Keep all follow-up visits as directed by your health care provider. This is important. Contact a health care provider if:  Your pain does not get better with treatment.  Your pain gets worse.  You have numbness or weakness in your forearm, hand, or fingers. This information is not intended to replace advice given to you by your health care provider. Make sure you discuss any questions you have with your  health care provider. Document Released: 06/11/2005 Document Revised: 02/09/2016 Document Reviewed: 06/07/2014 Elsevier Interactive Patient Education  [employer].

## 2017-08-21 NOTE — Progress Notes (Signed)
Chief Complaint  Patient presents with  . Acute Visit    right arm soreness, hand and finger numbness   C/o throbbing toothache pain right elbow 7/10 x 1 month now pain radiating down forearm into right hands. Hurts to bend and flex at elbow joint. She is now having pain at hand and wrist and hurts to pick up 37 y.o. ROM hurts. She has tried Ibuprofen 800 mg x 4 pills, tylenol w/o refief. Pain is worse in the am. She has had numbness/tingling in right hand.      Review of Systems  Constitutional: Negative for weight loss.  Respiratory: Negative for shortness of breath.   Cardiovascular: Negative for chest pain.  Musculoskeletal: Positive for joint pain.  Skin: Negative for rash.  Neurological: Positive for tingling and sensory change.   Past Medical History:  Diagnosis Date  . Anemia   . Anxiety   . Congenital malrotation of intestine   . Depression   . GERD (gastroesophageal reflux disease)    OCC  . Headache    H/O  . Obesity (BMI 30-39.9)   . OSA (obstructive sleep apnea)    USE CPAP   Past Surgical History:  Procedure Laterality Date  . BREAST REDUCTION SURGERY  12/18/02  . CESAREAN SECTION    . CESAREAN SECTION WITH BILATERAL TUBAL LIGATION N/A 08/04/2015   Procedure: CESAREAN SECTION WITH BILATERAL TUBAL LIGATION;  Surgeon: Nadara Mustardobert P Harris, MD;  Location: [facility] ORS;  Service: Obstetrics;  Laterality: N/A;  . LAPAROSCOPIC CHOLECYSTECTOMY W/ CHOLANGIOGRAPHY  01/22/11  . LESION REMOVAL N/A 05/04/2016   Procedure: EXCISION VAGINAL LESION;  Surgeon: Nadara Mustardobert P Harris, MD;  Location: [facility] ORS;  Service: Gynecology;  Laterality: N/A;  . NASAL SINUS SURGERY  05/26/03  . SLEEVE GASTROPLASTY    . TUBAL LIGATION     Family History  Problem Relation Age of Onset  . Hypertension Mother   . Diabetes Mother   . Leukemia Maternal Grandmother 40  . Diabetes Maternal Grandfather    Social History   Socioeconomic History  . Marital status: Married    Spouse name: Not on file  .  Number of children: Not on file  . Years of education: Not on file  . Highest education level: Not on file  Social Needs  . Financial resource strain: Not on file  . Food insecurity - worry: Not on file  . Food insecurity - inability: Not on file  . Transportation needs - medical: Not on file  . Transportation needs - non-medical: Not on file  Occupational History  . Not on file  Tobacco Use  . Smoking status: Former Smoker    Packs/day: 0.50    Years: 2.00    Pack years: 1.00    Types: Cigarettes    Last attempt to quit: 01/05/2003    Years since quitting: 14.6  . Smokeless tobacco: Never Used  Substance and Sexual Activity  . Alcohol use: No    Alcohol/week: 0.0 oz    Comment: "occasionally"  . Drug use: No  . Sexual activity: Yes    Birth control/protection: None  Other Topics Concern  . Not on file  Social History Narrative   Married.   2 children.   Works in Clinical biochemistcustomer service.   Enjoys spending time with family.   Current Meds  Medication Sig  . Multiple Vitamin (MULTI-VITAMINS) TABS Take by mouth.  . Norethindrone-Ethinyl Estradiol-Fe Biphas (LO LOESTRIN FE) 1 MG-10 MCG / 10 MCG tablet Take 1 tablet by  mouth daily.   Allergies  Allergen Reactions  . Penicillins Hives and Swelling    Has patient had a PCN reaction causing immediate rash, facial/tongue/throat swelling, SOB or lightheadedness with hypotension:No  Has patient had a PCN reaction causing severe rash involving mucus membranes or skin necrosis:Yes Has patient had a PCN reaction that required hospitalization:No Has patient had a PCN reaction occurring within the last 10 years:No If all of the above answers are "NO", then may proceed with Cephalosporin use.   . Wellbutrin [Bupropion] Other (See Comments)    Headaches  . Sulfa Antibiotics Hives and Swelling   No results found for this or any previous visit (from the past 2160 hour(s)). Objective  Body mass index is 39.3 kg/m. Wt Readings from Last 3  Encounters:  08/21/17 211 lb 6.4 oz (95.9 kg)  05/27/17 213 lb (96.6 kg)  03/01/17 210 lb 12.8 oz (95.6 kg)   Temp Readings from Last 3 Encounters:  08/21/17 98.5 F (36.9 C) (Oral)  03/01/17 98.5 F (36.9 C) (Oral)  11/13/16 99.7 F (37.6 C) (Oral)   BP Readings from Last 3 Encounters:  08/21/17 126/68  05/27/17 130/70  03/01/17 104/72   Pulse Readings from Last 3 Encounters:  08/21/17 77  05/27/17 86  03/01/17 85   O2 sat room air 98%  Physical Exam  Constitutional: She is oriented to person, place, and time and well-developed, well-nourished, and in no distress. Vital signs are normal.  HENT:  Head: Normocephalic and atraumatic.  Mouth/Throat: Oropharynx is clear and moist and mucous membranes are normal.  Eyes: Conjunctivae are normal. Pupils are equal, round, and reactive to light.  Cardiovascular: Normal rate, regular rhythm and normal heart sounds.  Pulmonary/Chest: Effort normal and breath sounds normal.  Musculoskeletal:       Right elbow: She exhibits decreased range of motion and swelling. Tenderness found. Medial epicondyle and lateral epicondyle tenderness noted. No olecranon process tenderness noted.       Arms: Right lateral elbow pain >medial elbow pain and pain with ROM   Neurological: She is alert and oriented to person, place, and time. Gait normal. Gait normal.  Skin: Skin is warm and dry.  Psychiatric: Mood, memory, affect and judgment normal.  Nursing note and vitals reviewed.   Assessment   1. C/w lateral epicondylitis  Plan  1. Xray right elbow Rx counter force brace  Trial ice then heat  Rx tramadol 50 mg bid prn #10 no refills  Cont Ibuprofen 800 mg max 3 pills per day prn  Refer to [facility] Dr. Kirtland Bouchard may benefit from steroid injection  Provider: Dr. French Ana McLean-Scocuzza-Internal Medicine

## 2017-09-18 ENCOUNTER — Ambulatory Visit (INDEPENDENT_AMBULATORY_CARE_PROVIDER_SITE_OTHER): Payer: BLUE CROSS/BLUE SHIELD | Admitting: Internal Medicine

## 2017-09-18 ENCOUNTER — Ambulatory Visit: Payer: BLUE CROSS/BLUE SHIELD | Admitting: Primary Care

## 2017-09-18 ENCOUNTER — Encounter: Payer: Self-pay | Admitting: Internal Medicine

## 2017-09-18 DIAGNOSIS — M25521 Pain in right elbow: Secondary | ICD-10-CM | POA: Diagnosis not present

## 2017-09-18 MED ORDER — TRAMADOL HCL 50 MG PO TABS: 50.0000 mg | ORAL_TABLET | Freq: Every evening | ORAL | 0 refills | Status: DC | PRN

## 2017-09-18 NOTE — Patient Instructions (Addendum)
Please call Dr. Kirtland BouchardK to get steroid injection into elbow  Continue Tylenol and Ibuprofen for elbow pain and as needed Tramadol only as needed but this is not long term  F/u in 3-6 months sooner if needed  Take care

## 2017-09-18 NOTE — Progress Notes (Signed)
Pre visit review using our clinic review tool, if applicable. No additional management support is needed unless otherwise documented below in the visit note. 

## 2017-09-18 NOTE — Progress Notes (Addendum)
Chief Complaint  Patient presents with  . Follow-up   Fu elbow pain  1. Right elbow pain worse at night hurts on outside and inside was 7-8/10 now 6/10 tried Prednisone and it did help but made her moody saw Dr. Kirtland BouchardK who rec counterforce brace. She is having improved ROM and pain was worse with ROM but somewhat improved. Stretching also helps pain. She wants refill of Tramadol due to worsening pain at night.  She tried Ibuprofen as well. reveiewed Xray with mild coronoid degenerative spur formation.   Review of Systems  Constitutional: Negative for weight loss.  Cardiovascular: Negative for chest pain.  Musculoskeletal: Positive for myalgias.   Past Medical History:  Diagnosis Date  . Anemia   . Anxiety   . Congenital malrotation of intestine   . Depression   . GERD (gastroesophageal reflux disease)    OCC  . Headache    H/O  . Obesity (BMI 30-39.9)   . OSA (obstructive sleep apnea)    USE CPAP   Past Surgical History:  Procedure Laterality Date  . BREAST REDUCTION SURGERY  12/18/02  . CESAREAN SECTION    . CESAREAN SECTION WITH BILATERAL TUBAL LIGATION N/A 08/04/2015   Procedure: CESAREAN SECTION WITH BILATERAL TUBAL LIGATION;  Surgeon: Nadara Mustardobert P Harris, MD;  Location: [facility];  Service: Obstetrics;  Laterality: N/A;  . LAPAROSCOPIC CHOLECYSTECTOMY W/ CHOLANGIOGRAPHY  01/22/11  . LESION REMOVAL N/A 05/04/2016   Procedure: EXCISION VAGINAL LESION;  Surgeon: Nadara Mustardobert P Harris, MD;  Location: [facility];  Service: Gynecology;  Laterality: N/A;  . NASAL SINUS SURGERY  05/26/03  . SLEEVE GASTROPLASTY    . TUBAL LIGATION     Family History  Problem Relation Age of Onset  . Hypertension Mother   . Diabetes Mother   . Leukemia Maternal Grandmother 40  . Diabetes Maternal Grandfather    Social History   Socioeconomic History  . Marital status: Married    Spouse name: Not on file  . Number of children: Not on file  . Years of education: Not on file  . Highest education level: Not on  file  Occupational History  . Not on file  Social Needs  . Financial resource strain: Not on file  . Food insecurity:    Worry: Not on file    Inability: Not on file  . Transportation needs:    Medical: Not on file    Non-medical: Not on file  Tobacco Use  . Smoking status: Former Smoker    Packs/day: 0.50    Years: 2.00    Pack years: 1.00    Types: Cigarettes    Last attempt to quit: 01/05/2003    Years since quitting: 14.7  . Smokeless tobacco: Never Used  Substance and Sexual Activity  . Alcohol use: No    Alcohol/week: 0.0 oz    Comment: "occasionally"  . Drug use: No  . Sexual activity: Yes    Birth control/protection: None  Lifestyle  . Physical activity:    Days per week: 2 days    Minutes per session: 30 min  . Stress: Rather much  Relationships  . Social connections:    Talks on phone: More than three times a week    Gets together: Once a week    Attends religious service: More than 4 times per year    Active member of club or organization: No    Attends meetings of clubs or organizations: Never    Relationship status: Married  .  Intimate partner violence:    Fear of current or ex partner: No    Emotionally abused: No    Physically abused: No    Forced sexual activity: No  Other Topics Concern  . Not on file  Social History Narrative   Married.   2 children last kid had in 2017    Works in Clinical biochemist.   Enjoys spending time with family.   Current Meds  Medication Sig  . Multiple Vitamin (MULTI-VITAMINS) TABS Take by mouth.  . Norethindrone-Ethinyl Estradiol-Fe Biphas (LO LOESTRIN FE) 1 MG-10 MCG / 10 MCG tablet Take 1 tablet by mouth daily.  . traMADol (ULTRAM) 50 MG tablet Take 1 tablet (50 mg total) by mouth at bedtime as needed.  . [DISCONTINUED] traMADol (ULTRAM) 50 MG tablet Take 1 tablet (50 mg total) by mouth at bedtime as needed.   Allergies  Allergen Reactions  . Penicillins Hives and Swelling    Has patient had a PCN reaction  causing immediate rash, facial/tongue/throat swelling, SOB or lightheadedness with hypotension:No  Has patient had a PCN reaction causing severe rash involving mucus membranes or skin necrosis:Yes Has patient had a PCN reaction that required hospitalization:No Has patient had a PCN reaction occurring within the last 10 years:No If all of the above answers are "NO", then may proceed with Cephalosporin use.   . Wellbutrin [Bupropion] Other (See Comments)    Headaches  . Sulfa Antibiotics Hives and Swelling   No results found for this or any previous visit (from the past 2160 hour(s)). Objective  Body mass index is 39.37 kg/m. Wt Readings from Last 3 Encounters:  09/18/17 211 lb 12.8 oz (96.1 kg)  08/21/17 211 lb 6.4 oz (95.9 kg)  05/27/17 213 lb (96.6 kg)   Temp Readings from Last 3 Encounters:  09/18/17 98.4 F (36.9 C) (Oral)  08/21/17 98.5 F (36.9 C) (Oral)  03/01/17 98.5 F (36.9 C) (Oral)   BP Readings from Last 3 Encounters:  09/18/17 130/70  08/21/17 126/68  05/27/17 130/70   Pulse Readings from Last 3 Encounters:  09/18/17 99  08/21/17 77  05/27/17 86    Physical Exam  Constitutional: She is oriented to person, place, and time and well-developed, well-nourished, and in no distress. Vital signs are normal.  HENT:  Head: Normocephalic and atraumatic.  Mouth/Throat: Oropharynx is clear and moist and mucous membranes are normal.  Eyes: Pupils are equal, round, and reactive to light. Conjunctivae are normal.  Cardiovascular: Normal rate, regular rhythm and normal heart sounds.  Pulmonary/Chest: Effort normal and breath sounds normal.  Musculoskeletal:       Right elbow: She exhibits normal range of motion. Tenderness found. Medial epicondyle and lateral epicondyle tenderness noted. No olecranon process tenderness noted.  Neurological: She is alert and oriented to person, place, and time. Gait normal.  Skin: Skin is warm, dry and intact.  Benign appearing nevi and  skin tags to trunk  Psychiatric: Mood, memory, affect and judgment normal.  Nursing note and vitals reviewed.   Assessment   1. Right elbow pain c/w degenerative changes on Xray and spur formation and c/w medial/lateral epicondylitis  2. HM Plan  1.  rec pt f/u with Dr. Kirtland Bouchard for steroid injection cont brace and stretching otc nsaids sparingly, prn tylenol, only 5 day supply tramadol  2.  Had flu shot 03/2017  Tdap need to check records had [facility] 06/10/15   [facility] pap had 09/14/15 ASCUS. HPV neg   Dermatology no need for now but  consider in future  Provider: Dr. French Ana McLean-Scocuzza-Internal Medicine

## 2017-09-24 ENCOUNTER — Encounter: Payer: Self-pay | Admitting: Internal Medicine

## 2017-10-15 ENCOUNTER — Telehealth: Payer: Self-pay | Admitting: Family Medicine

## 2017-10-15 ENCOUNTER — Encounter: Payer: Self-pay | Admitting: Family Medicine

## 2017-10-15 ENCOUNTER — Ambulatory Visit
Admission: RE | Admit: 2017-10-15 | Discharge: 2017-10-15 | Disposition: A | Discharge status: Home / Self Care | Payer: BLUE CROSS/BLUE SHIELD | Source: Ambulatory Visit | Attending: Family Medicine | Admitting: Family Medicine

## 2017-10-15 ENCOUNTER — Ambulatory Visit (INDEPENDENT_AMBULATORY_CARE_PROVIDER_SITE_OTHER): Payer: BLUE CROSS/BLUE SHIELD | Admitting: Family Medicine

## 2017-10-15 VITALS — BP 128/77 | HR 84 | Temp 98.6°F | Ht 61.2 in | Wt 211.1 lb

## 2017-10-15 DIAGNOSIS — F329 Major depressive disorder, single episode, unspecified: Secondary | ICD-10-CM | POA: Diagnosis not present

## 2017-10-15 DIAGNOSIS — M79604 Pain in right leg: Secondary | ICD-10-CM | POA: Insufficient documentation

## 2017-10-15 DIAGNOSIS — Z13220 Encounter for screening for lipoid disorders: Secondary | ICD-10-CM

## 2017-10-15 DIAGNOSIS — M7711 Lateral epicondylitis, right elbow: Secondary | ICD-10-CM | POA: Diagnosis not present

## 2017-10-15 DIAGNOSIS — M791 Myalgia, unspecified site: Secondary | ICD-10-CM

## 2017-10-15 DIAGNOSIS — F419 Anxiety disorder, unspecified: Secondary | ICD-10-CM | POA: Diagnosis not present

## 2017-10-15 MED ORDER — MELOXICAM 15 MG PO TABS: 15.0000 mg | ORAL_TABLET | Freq: Every day | ORAL | 2 refills | Status: DC

## 2017-10-15 MED ORDER — FLUOXETINE HCL 20 MG PO CAPS: 20.0000 mg | ORAL_CAPSULE | Freq: Every day | ORAL | 3 refills | Status: DC

## 2017-10-15 NOTE — Progress Notes (Signed)
BP 128/77 (BP Location: Left Arm, Patient Position: Sitting, Cuff Size: Normal)   Pulse 84   Temp 98.6 F (37 C)   Ht 5' 1.2" (1.554 m)   Wt 211 lb 2 oz (95.8 kg)   LMP 10/07/2017 (Approximate)   SpO2 100%   BMI 39.63 kg/m    Subjective:    Patient ID: Amber [name], female    DOB: August 09, 1980, 37 y.o.   MRN: [id number]  HPI: Amber [name] is a 37 y.o. female who presents today to establish care.  Chief Complaint  Patient presents with  . Elbow Pain    right  . Leg Pain    right, Patient feel down the steps a couple weeks ago, leg and ankle was brusied, now it is better, but still having a pulling pain  . trouble focusing   Larey Seat down 12 stairs about 2 weeks ago. Has been having pain in her R ankle anteriorly. Has been taking tyenol. Has not taken any NSAIDs. She did not go to see anyone. Had severe bruising. She does note that she could walk on it, but that it continues to really hurt especially when she's walking on it. Pain is about 6/10. Located in the anterior portion of her R ankle and shin. No radiation. No change in ROM. Tylenol and ice helps. Walking on it makes it worse. No fevers. No numbness or tingling.   Has tennis elbow in her R elbow. She notes that she occasionally gets tight through her neck. Has not taken any shots. Has a brace, but doesn't wear it. She notes that it's much better than it was.   DEPRESSION- Has been having some trouble focusing. She notes that she has some trouble with her job because of it. She notes that this has been going on for a couple of months, has had issues with her motivation for years. Had had post-partum with both her kids Mood status: uncontrolled Satisfied with current treatment?: yes Symptom severity: mild  Duration of current treatment : chronic Side effects: no Medication compliance: Not on anything Psychotherapy/counseling: no  Previous psychiatric medications: prozac, wellbutrin and zoloft Depressed mood: yes Anxious  mood: yes Anhedonia: no Significant weight loss or gain: no Insomnia: no  Fatigue: yes Feelings of worthlessness or guilt: yes Impaired concentration/indecisiveness: yes Suicidal ideations: no Hopelessness: no Crying spells: yes Depression screen [facility] 2/9 10/15/2017 09/18/2017 03/01/2017 10/22/2016 04/02/2016  Decreased Interest 1 0 0 3 0  Down, Depressed, Hopeless 2 0 0 3 0  PHQ - 2 Score 3 0 0 6 0  Altered sleeping 3 - - 3 -  Tired, decreased energy 3 - - 3 -  Change in appetite 1 - - 1 -  Feeling bad or failure about yourself  1 - - 1 -  Trouble concentrating 2 - - 1 -  Moving slowly or fidgety/restless 0 - - 1 -  Suicidal thoughts 1 - - 0 -  PHQ-9 Score 14 - - 16 -  Difficult doing work/chores Very difficult - - - -     Active Ambulatory Problems    Diagnosis Date Noted  . Obesity (BMI 30-39.9)   . Anxiety and depression 04/02/2016  . Lateral epicondylitis of right elbow 08/21/2017   Resolved Ambulatory Problems    Diagnosis Date Noted  . Symptomatic cholelithiasis 01/08/2011  . Congenital malrotation of intestine 01/08/2011  . Depression 02/02/2015  . Tobacco quit date established 02/02/2015  . History of cesarean delivery, currently pregnant 08/04/2015  .  Postpartum care following cesarean delivery 08/04/2015  . Obesity, Class II, BMI 35-39.9, with comorbidity 11/29/2015  . Facial cellulitis 03/07/2016  . Vulvar lesion 05/04/2016  . Menorrhagia 09/11/2016  . Tingling in extremities 10/22/2016  . Preventative health care 03/01/2017  . Elbow pain, right 08/21/2017   Past Medical History:  Diagnosis Date  . Anemia   . Anxiety   . Congenital malrotation of intestine   . Depression   . GERD (gastroesophageal reflux disease)   . Headache   . Obesity (BMI 30-39.9)   . OSA (obstructive sleep apnea)    Past Surgical History:  Procedure Laterality Date  . BREAST REDUCTION SURGERY  12/18/02  . CESAREAN SECTION     X 2  . CESAREAN SECTION WITH BILATERAL TUBAL  LIGATION N/A 08/04/2015   Procedure: CESAREAN SECTION WITH BILATERAL TUBAL LIGATION;  Surgeon: Nadara Mustard, MD;  Location: [facility];  Service: Obstetrics;  Laterality: N/A;  . LAPAROSCOPIC CHOLECYSTECTOMY W/ CHOLANGIOGRAPHY  01/22/11  . LESION REMOVAL N/A 05/04/2016   Procedure: EXCISION VAGINAL LESION;  Surgeon: Nadara Mustard, MD;  Location: [facility] OR[facility];  Service: Gynecology;  Laterality: N/A;  . NASAL SINUS SURGERY  05/26/03  . SLEEVE GASTROPLASTY    . TUBAL LIGATION     Outpatient Encounter Medications as of 10/15/2017  Medication Sig  . FLUoxetine (PROZAC) 20 MG capsule Take 1 capsule (20 mg total) by mouth daily.  . meloxicam (MOBIC) 15 MG tablet Take 1 tablet (15 mg total) by mouth daily.  . Multiple Vitamin (MULTI-VITAMINS) TABS Take by mouth.  . [DISCONTINUED] Norethindrone-Ethinyl Estradiol-Fe Biphas (LO LOESTRIN FE) 1 MG-10 MCG / 10 MCG tablet Take 1 tablet by mouth daily.  . [DISCONTINUED] traMADol (ULTRAM) 50 MG tablet Take 1 tablet (50 mg total) by mouth at bedtime as needed.   No facility-administered encounter medications on file as of 10/15/2017.    Allergies  Allergen Reactions  . Penicillins Hives and Swelling    Has patient had a PCN reaction causing immediate rash, facial/tongue/throat swelling, SOB or lightheadedness with hypotension:No  Has patient had a PCN reaction causing severe rash involving mucus membranes or skin necrosis:Yes Has patient had a PCN reaction that required hospitalization:No Has patient had a PCN reaction occurring within the last 10 years:No If all of the above answers are "NO", then may proceed with Cephalosporin use.   . Wellbutrin [Bupropion] Other (See Comments)    Headaches  . Sulfa Antibiotics Hives and Swelling   Social History   Socioeconomic History  . Marital status: Married    Spouse name: Not on file  . Number of children: Not on file  . Years of education: Not on file  . Highest education level: Not on file  Occupational  History  . Not on file  Social Needs  . Financial resource strain: Not on file  . Food insecurity:    Worry: Not on file    Inability: Not on file  . Transportation needs:    Medical: Not on file    Non-medical: Not on file  Tobacco Use  . Smoking status: Former Smoker    Packs/day: 0.50    Years: 2.00    Pack years: 1.00    Types: Cigarettes    Last attempt to quit: 01/05/2003    Years since quitting: 14.7  . Smokeless tobacco: Never Used  Substance and Sexual Activity  . Alcohol use: No    Alcohol/week: 0.0 oz    Comment: "occasionally"  . Drug use:  No  . Sexual activity: Yes    Birth control/protection: Surgical  Lifestyle  . Physical activity:    Days per week: 2 days    Minutes per session: 30 min  . Stress: Rather much  Relationships  . Social connections:    Talks on phone: More than three times a week    Gets together: Once a week    Attends religious service: More than 4 times per year    Active member of club or organization: No    Attends meetings of clubs or organizations: Never    Relationship status: Married  . Intimate partner violence:    Fear of current or ex partner: No    Emotionally abused: No    Physically abused: No    Forced sexual activity: No  Other Topics Concern  . Not on file  Social History Narrative   Married.   2 children last kid had in 2017    Works in Clinical biochemistcustomer service.   Enjoys spending time with family.   Family History  Problem Relation Age of Onset  . Hypertension Mother   . Diabetes Mother   . Hypertension Brother   . Leukemia Maternal Grandmother 40  . Diabetes Maternal Grandfather   . COPD Maternal Grandfather   . Heart disease Maternal Grandfather      Review of Systems  Constitutional: Negative.   Respiratory: Negative.   Cardiovascular: Negative.   Musculoskeletal: Positive for arthralgias, gait problem and myalgias. Negative for back pain, joint swelling, neck pain and neck stiffness.  Skin: Positive for  color change. Negative for pallor, rash and wound.  Hematological: Negative.   Psychiatric/Behavioral: Positive for decreased concentration and dysphoric mood. Negative for agitation, behavioral problems, confusion, hallucinations, self-injury, sleep disturbance and suicidal ideas. The patient is nervous/anxious. The patient is not hyperactive.     Per HPI unless specifically indicated above     Objective:    BP 128/77 (BP Location: Left Arm, Patient Position: Sitting, Cuff Size: Normal)   Pulse 84   Temp 98.6 F (37 C)   Ht 5' 1.2" (1.554 m)   Wt 211 lb 2 oz (95.8 kg)   LMP 10/07/2017 (Approximate)   SpO2 100%   BMI 39.63 kg/m   Wt Readings from Last 3 Encounters:  10/15/17 211 lb 2 oz (95.8 kg)  09/18/17 211 lb 12.8 oz (96.1 kg)  08/21/17 211 lb 6.4 oz (95.9 kg)    Physical Exam  Constitutional: She is oriented to person, place, and time. She appears well-developed and well-nourished. No distress.  HENT:  Head: Normocephalic and atraumatic.  Right Ear: Hearing normal.  Left Ear: Hearing normal.  Nose: Nose normal.  Eyes: Conjunctivae and lids are normal. Right eye exhibits no discharge. Left eye exhibits no discharge. No scleral icterus.  Cardiovascular: Normal rate, regular rhythm, normal heart sounds and intact distal pulses. Exam reveals no gallop and no friction rub.  No murmur heard. Pulmonary/Chest: Effort normal and breath sounds normal. No stridor. No respiratory distress. She has no wheezes. She has no rales. She exhibits no tenderness.  Musculoskeletal: Normal range of motion. She exhibits tenderness (along anterior tibia on the R).  Neurological: She is alert and oriented to person, place, and time.  Skin: Skin is warm, dry and intact. Capillary refill takes less than 2 seconds. No rash noted. She is not diaphoretic. No erythema. No pallor.  Psychiatric: She has a normal mood and affect. Her speech is normal and behavior is normal. Judgment and  thought content  normal. Cognition and memory are normal.  Nursing note and vitals reviewed.   Results for orders placed or performed in visit on 09/24/17  HM PAP SMEAR  Result Value Ref Range   HM Pap smear ascus       Assessment & Plan:   Problem List Items Addressed This Visit      Musculoskeletal and Integument   Lateral epicondylitis of right elbow    Doing better. Exercises given. Will start meloxicam. Encouraged brace. Call with any concerns.       Relevant Medications   meloxicam (MOBIC) 15 MG tablet   Other Relevant Orders   CBC with Differential/Platelet   Comprehensive metabolic panel   UA/M w/rflx Culture, Routine     Other   Anxiety and depression    Likely the cause of her trouble concentrating. Will start low dose prozac and recheck in 1 month. Call with any concerns.       Relevant Medications   FLUoxetine (PROZAC) 20 MG capsule   Other Relevant Orders   CBC with Differential/Platelet   Comprehensive metabolic panel   Thyroid Panel With TSH   UA/M w/rflx Culture, Routine    Other Visit Diagnoses    Right leg pain    -  Primary   S/P Fall, likely contusion, but will get X-ray to rule out small fracture. Await results. Start meloxicam. Recheck 1 month.    Relevant Orders   DG Tibia/Fibula Right   DG Ankle Complete Right   CBC with Differential/Platelet   Comprehensive metabolic panel   UA/M w/rflx Culture, Routine   Myalgia       Labs drawn today. Await results.   Relevant Orders   CBC with Differential/Platelet   Comprehensive metabolic panel   VITAMIN D 25 Hydroxy (Vit-D Deficiency, Fractures)   UA/M w/rflx Culture, Routine   Screening for cholesterol level       Labs drawn today. Await results.   Relevant Orders   Lipid Panel w/o Chol/HDL Ratio       Follow up plan: Return 3-4 weeks, for follow up mood and ankle.

## 2017-10-15 NOTE — Assessment & Plan Note (Signed)
Doing better. Exercises given. Will start meloxicam. Encouraged brace. Call with any concerns.

## 2017-10-15 NOTE — Telephone Encounter (Signed)
Called no answer, left a detailed message for patient letting her know what Dr.Johnson had said.

## 2017-10-15 NOTE — Telephone Encounter (Signed)
Please let her know that her x-ray came back normal. Go ahead and take the meloxicam and we'll see how she does. Thanks!

## 2017-10-15 NOTE — Assessment & Plan Note (Signed)
Likely the cause of her trouble concentrating. Will start low dose prozac and recheck in 1 month. Call with any concerns.

## 2017-10-15 NOTE — Patient Instructions (Signed)
Foot Contusion A foot contusion is a deep bruise to the foot. Contusions are the result of a blunt injury to tissues and muscle fibers under the skin. The injury causes bleeding under the skin. The skin overlying the contusion may turn blue, purple, or yellow. Minor injuries will give you a painless contusion, but more severe contusions may stay painful and swollen for a few weeks. What are the causes? This condition is usually caused by a hard hit, trauma, or direct force to your foot, such as having a heavy object fall on your foot. What are the signs or symptoms? Symptoms of this condition include:  Swelling of the foot.  Pain and tenderness of the foot.  Discoloration of the foot. The area may have redness and then turn blue, purple, or yellow.  How is this diagnosed? This condition is diagnosed from a physical exam and your medical history. An X-ray may be needed to see if there are any other injuries, such as broken bones (fractures). Sometimes, a CT scan or MRI may be needed if your health care provider is concerned that you have torn or injured ligaments. How is this treated? In general, the best treatment for a foot contusion is rest, ice, pressure (compression), and elevation. This is often called RICE therapy. An elastic wrap may be recommended to support your foot. Over-the-counter anti-inflammatory medicines may also be recommended for pain control. If your swelling or pain is severe, you may be given crutches. Follow these instructions at home: RICE Therapy  Rest the injured area. Try to avoid standing or walking while your foot is painful.  If directed, apply ice to the injured area: ? Put ice in a plastic bag. ? Place a towel between your skin and the bag. ? Leave the ice on for 20 minutes, 2-3 times per day.  If directed, apply light compression to the injured area using an elastic wrap. Make sure the wrap is not too tight. Remove and reapply the wrap as told by your  health care provider. If your toes become numb, cold, or blue, take the wrap off and reapply it more loosely.  Raise (elevate) the injured area above the level of your heart while you are sitting or lying down. General instructions  Take over-the-counter and prescription medicines only as told by your health care provider.  Use crutches as told by your health care provider, if this applies.  Keep all follow-up visits as told by your health care provider. This is important. Contact a health care provider if:  Your symptoms do not improve after several days of treatment.  You have more redness, swelling, or pain in your foot or toes.  You have difficulty moving the injured area.  Your swelling or pain is not relieved with medicines. Get help right away if:  You have severe pain.  Your foot or toes become numb.  Your foot or toes become pale or cold.  You cannot move your foot or ankle.  Your foot is warm to the touch. This information is not intended to replace advice given to you by your health care provider. Make sure you discuss any questions you have with your health care provider. Document Released: 04/02/2006 Document Revised: 11/17/2015 Document Reviewed: 02/15/2015 Elsevier Interactive Patient Education  2018 [employer]  [facility] Elbow Rehab Ask your health care provider which exercises are safe for you. Do exercises exactly as told by your health care provider and adjust them as directed. It is normal to feel  mild stretching, pulling, tightness, or discomfort as you do these exercises, but you should stop right away if you feel sudden pain or your pain gets worse. Do not begin these exercises until told by your health care provider. Stretching and range of motion exercises These exercises warm up your muscles and joints and improve the movement and flexibility of your elbow. These exercises also help to relieve pain, numbness, and tingling. Exercise A: Wrist extensor  stretch 1. Extend your left / right elbow with your fingers pointing down. 2. Gently pull the palm of your left / right hand toward you until you feel a gentle stretch on the top of your forearm. 3. To increase the stretch, push your left / right hand toward the outer edge or pinkie side of your forearm. 4. Hold this position for __________ seconds. Repeat __________ times. Complete this exercise __________ times a day. If directed by your health care provider, repeat this stretch except do it with a bent elbow this time. Exercise B: Wrist flexor stretch  1. Extend your left / right elbow and turn your palm upward. 2. Gently pull your left / right palm and fingertips back so your wrist extends and your fingers point more toward the ground. 3. You should feel a gentle stretch on the inside of your forearm. 4. Hold this position for __________ seconds. Repeat __________ times. Complete this exercise __________ times a day. If directed by your health care provider, repeat this stretch except do it with a bent elbow this time. Strengthening exercises These exercises build strength and endurance in your elbow. Endurance is the ability to use your muscles for a long time, even after they get tired. Exercise C: Wrist extensors  1. Sit with your left / right forearm palm-down and fully supported on a table or countertop. Your elbow should be resting below the height of your shoulder. 2. Let your left / right wrist extend over the edge of the surface. 3. Loosely hold a __________ weight or a piece of rubber exercise band or tubing in your left / right hand. Slowly curl your left / right hand up toward your forearm. If you are using band or tubing, hold the band or tubing in place with your other hand to provide resistance. 4. Hold this position for __________ seconds. 5. Slowly return to the starting position. Repeat __________ times. Complete this exercise __________ times a day. Exercise D: Radial  deviators  1. Stand with a __________ weight in your left / righthand. Or, sit while holding a rubber exercise band or tubing with your other arm supported on a table or countertop. Position your hand so your thumb is on top. 2. Raise your hand upward in front of you so your thumb travels toward your forearm, or pull up on the rubber tubing. 3. Hold this position for __________ seconds. 4. Slowly return to the starting position. Repeat __________ times. Complete this exercise __________ times a day. Exercise E: Eccentric wrist extensors 1. Sit with your left / right forearm palm-down and fully supported on a table or countertop. Your elbow should be resting below the height of your shoulder. 2. If told by your health care provider, hold a __________ weight in your hand. 3. Let your left / right wrist extend over the edge of the surface. 4. Use your other hand to lift up your left / right hand toward your forearm. Keep your forearm on the table. 5. Using only the muscles in your left /  right hand, slowly lower your hand back down to the starting position. Repeat __________ times. Complete this exercise __________ times a day. This information is not intended to replace advice given to you by your health care provider. Make sure you discuss any questions you have with your health care provider. Document Released: 06/11/2005 Document Revised: 02/15/2016 Document Reviewed: 03/10/2015 Elsevier Interactive Patient Education  2018 [employer]. Fluoxetine capsules or tablets (Depression/Mood Disorders) What is this medicine? FLUOXETINE (floo OX e teen) belongs to a class of drugs known as selective serotonin reuptake inhibitors (SSRIs). It helps to treat mood problems such as depression, obsessive compulsive disorder, and panic attacks. It can also treat certain eating disorders. This medicine may be used for other purposes; ask your health care provider or pharmacist if you have questions. COMMON  BRAND NAME(S): Prozac What should I tell my health care provider before I take this medicine? They need to know if you have any of these conditions: -bipolar disorder or a family history of bipolar disorder -bleeding disorders -glaucoma -heart disease -liver disease -low levels of sodium in the blood -seizures -suicidal thoughts, plans, or attempt; a previous suicide attempt by you or a family member -take MAOIs like Carbex, Eldepryl, Marplan, Nardil, and Parnate -take medicines that treat or prevent blood clots -thyroid disease -an unusual or allergic reaction to fluoxetine, other medicines, foods, dyes, or preservatives -pregnant or trying to get pregnant -breast-feeding How should I use this medicine? Take this medicine by mouth with a glass of water. Follow the directions on the prescription label. You can take this medicine with or without food. Take your medicine at regular intervals. Do not take it more often than directed. Do not stop taking this medicine suddenly except upon the advice of your doctor. Stopping this medicine too quickly may cause serious side effects or your condition may worsen. A special MedGuide will be given to you by the pharmacist with each prescription and refill. Be sure to read this information carefully each time. Talk to your pediatrician regarding the use of this medicine in children. While this drug may be prescribed for children as young as 7 years for selected conditions, precautions do apply. Overdosage: If you think you have taken too much of this medicine contact a poison control center or emergency room at once. NOTE: This medicine is only for you. Do not share this medicine with others. What if I miss a dose? If you miss a dose, skip the missed dose and go back to your regular dosing schedule. Do not take double or extra doses. What may interact with this medicine? Do not take this medicine with any of the following medications: -other medicines  containing fluoxetine, like Sarafem or Symbyax -cisapride -linezolid -MAOIs like Carbex, Eldepryl, Marplan, Nardil, and Parnate -methylene blue (injected into a vein) -pimozide -thioridazine This medicine may also interact with the following medications: -alcohol -amphetamines -aspirin and aspirin-like medicines -carbamazepine -certain medicines for depression, anxiety, or psychotic disturbances -certain medicines for migraine headaches like almotriptan, eletriptan, frovatriptan, naratriptan, rizatriptan, sumatriptan, zolmitriptan -digoxin -diuretics -fentanyl -flecainide -furazolidone -isoniazid -lithium -medicines for sleep -medicines that treat or prevent blood clots like warfarin, enoxaparin, and dalteparin -NSAIDs, medicines for pain and inflammation, like ibuprofen or naproxen -phenytoin -procarbazine -propafenone -rasagiline -ritonavir -supplements like St. John's wort, kava kava, valerian -tramadol -tryptophan -vinblastine This list may not describe all possible interactions. Give your health care provider a list of all the medicines, herbs, non-prescription drugs, or dietary supplements you use. Also tell them  if you smoke, drink alcohol, or use illegal drugs. Some items may interact with your medicine. What should I watch for while using this medicine? Tell your doctor if your symptoms do not get better or if they get worse. Visit your doctor or health care professional for regular checks on your progress. Because it may take several weeks to see the full effects of this medicine, it is important to continue your treatment as prescribed by your doctor. Patients and their families should watch out for new or worsening thoughts of suicide or depression. Also watch out for sudden changes in feelings such as feeling anxious, agitated, panicky, irritable, hostile, aggressive, impulsive, severely restless, overly excited and hyperactive, or not being able to sleep. If this  happens, especially at the beginning of treatment or after a change in dose, call your health care professional. Bonita Quin may get drowsy or dizzy. Do not drive, use machinery, or do anything that needs mental alertness until you know how this medicine affects you. Do not stand or sit up quickly, especially if you are an older patient. This reduces the risk of dizzy or fainting spells. Alcohol may interfere with the effect of this medicine. Avoid alcoholic drinks. Your mouth may get dry. Chewing sugarless gum or sucking hard candy, and drinking plenty of water may help. Contact your doctor if the problem does not go away or is severe. This medicine may affect blood sugar levels. If you have diabetes, check with your doctor or health care professional before you change your diet or the dose of your diabetic medicine. What side effects may I notice from receiving this medicine? Side effects that you should report to your doctor or health care professional as soon as possible: -allergic reactions like skin rash, itching or hives, swelling of the face, lips, or tongue -anxious -black, tarry stools -breathing problems -changes in vision -confusion -elevated mood, decreased need for sleep, racing thoughts, impulsive behavior -eye pain -fast, irregular heartbeat -feeling faint or lightheaded, falls -feeling agitated, angry, or irritable -hallucination, loss of contact with reality -loss of balance or coordination -loss of memory -painful or prolonged erections -restlessness, pacing, inability to keep still -seizures -stiff muscles -suicidal thoughts or other mood changes -trouble sleeping -unusual bleeding or bruising -unusually weak or tired -vomiting Side effects that usually do not require medical attention (report to your doctor or health care professional if they continue or are bothersome): -change in appetite or weight -change in sex drive or performance -diarrhea -dry  mouth -headache -increased sweating -nausea -tremors This list may not describe all possible side effects. Call your doctor for medical advice about side effects. You may report side effects to FDA at 1-800-FDA-1088. Where should I keep my medicine? Keep out of the reach of children. Store at room temperature between 15 and 30 degrees C (59 and 86 degrees F). Throw away any unused medicine after the expiration date. NOTE: This sheet is a summary. It may not cover all possible information. If you have questions about this medicine, talk to your doctor, pharmacist, or health care provider.  2018 Elsevier/Gold Standard (2015-11-12 15:55:27)

## 2017-10-16 ENCOUNTER — Telehealth: Payer: Self-pay | Admitting: Family Medicine

## 2017-10-16 LAB — CBC WITH DIFFERENTIAL/PLATELET
BASOS: 1 %
Basophils Absolute: 0 10*3/uL (ref 0.0–0.2)
EOS (ABSOLUTE): 0.1 10*3/uL (ref 0.0–0.4)
EOS: 2 %
HEMOGLOBIN: 11 g/dL — AB (ref 11.1–15.9)
Hematocrit: 37.4 % (ref 34.0–46.6)
IMMATURE GRANULOCYTES: 0 %
Immature Grans (Abs): 0 10*3/uL (ref 0.0–0.1)
Lymphocytes Absolute: 1.9 10*3/uL (ref 0.7–3.1)
Lymphs: 37 %
MCH: 22.2 pg — ABNORMAL LOW (ref 26.6–33.0)
MCHC: 29.4 g/dL — ABNORMAL LOW (ref 31.5–35.7)
MCV: 76 fL — AB (ref 79–97)
MONOCYTES: 9 %
MONOS ABS: 0.5 10*3/uL (ref 0.1–0.9)
NEUTROS PCT: 51 %
Neutrophils Absolute: 2.6 10*3/uL (ref 1.4–7.0)
Platelets: 354 10*3/uL (ref 150–379)
RBC: 4.95 x10E6/uL (ref 3.77–5.28)
RDW: 17.7 % — ABNORMAL HIGH (ref 12.3–15.4)
WBC: 5.1 10*3/uL (ref 3.4–10.8)

## 2017-10-16 LAB — THYROID PANEL WITH TSH
FREE THYROXINE INDEX: 1.7 (ref 1.2–4.9)
T3 Uptake Ratio: 25 % (ref 24–39)
T4 TOTAL: 6.8 ug/dL (ref 4.5–12.0)
TSH: 1.5 u[IU]/mL (ref 0.450–4.500)

## 2017-10-16 LAB — COMPREHENSIVE METABOLIC PANEL
A/G RATIO: 1.9 (ref 1.2–2.2)
ALT: 11 IU/L (ref 0–32)
AST: 13 IU/L (ref 0–40)
Albumin: 4.7 g/dL (ref 3.5–5.5)
Alkaline Phosphatase: 51 IU/L (ref 39–117)
BUN/Creatinine Ratio: 15 (ref 9–23)
BUN: 12 mg/dL (ref 6–20)
Bilirubin Total: 0.7 mg/dL (ref 0.0–1.2)
CALCIUM: 9.3 mg/dL (ref 8.7–10.2)
CO2: 22 mmol/L (ref 20–29)
Chloride: 101 mmol/L (ref 96–106)
Creatinine, Ser: 0.81 mg/dL (ref 0.57–1.00)
GFR, EST AFRICAN AMERICAN: 106 mL/min/{1.73_m2} (ref >59)
GFR, EST NON AFRICAN AMERICAN: 92 mL/min/{1.73_m2} (ref >59)
Globulin, Total: 2.5 g/dL (ref 1.5–4.5)
Glucose: 78 mg/dL (ref 65–99)
POTASSIUM: 4.5 mmol/L (ref 3.5–5.2)
SODIUM: 141 mmol/L (ref 134–144)
TOTAL PROTEIN: 7.2 g/dL (ref 6.0–8.5)

## 2017-10-16 LAB — LIPID PANEL W/O CHOL/HDL RATIO
Cholesterol, Total: 182 mg/dL (ref 100–199)
HDL: 60 mg/dL (ref >39)
LDL CALC: 92 mg/dL (ref 0–99)
Triglycerides: 150 mg/dL — ABNORMAL HIGH (ref 0–149)
VLDL Cholesterol Cal: 30 mg/dL (ref 5–40)

## 2017-10-16 LAB — VITAMIN D 25 HYDROXY (VIT D DEFICIENCY, FRACTURES): VIT D 25 HYDROXY: 17.1 ng/mL — AB (ref 30.0–100.0)

## 2017-10-16 NOTE — Telephone Encounter (Signed)
Please let her know that her labs came back normal except her vitamin D is a little low. Please have her take 1000 IU D3 OTC daily. Everything else looks good. Thanks!

## 2017-10-16 NOTE — Telephone Encounter (Signed)
Called and left a detailed message for patient letting her know her lab results.  

## 2017-10-17 LAB — UA/M W/RFLX CULTURE, ROUTINE
Bilirubin, UA: NEGATIVE
Glucose, UA: NEGATIVE
Ketones, UA: NEGATIVE
NITRITE UA: NEGATIVE
Protein, UA: NEGATIVE
SPEC GRAV UA: 1.01 (ref 1.005–1.030)
Urobilinogen, Ur: 0.2 mg/dL (ref 0.2–1.0)
pH, UA: 7.5 (ref 5.0–7.5)

## 2017-10-17 LAB — MICROSCOPIC EXAMINATION

## 2017-10-17 LAB — URINE CULTURE, REFLEX

## 2017-10-24 ENCOUNTER — Ambulatory Visit: Payer: BLUE CROSS/BLUE SHIELD | Admitting: Family Medicine

## 2017-10-24 ENCOUNTER — Encounter: Payer: Self-pay | Admitting: Family Medicine

## 2017-10-24 VITALS — BP 115/73 | HR 85 | Temp 97.6°F | Wt 214.3 lb

## 2017-10-24 DIAGNOSIS — F329 Major depressive disorder, single episode, unspecified: Secondary | ICD-10-CM

## 2017-10-24 DIAGNOSIS — S93401D Sprain of unspecified ligament of right ankle, subsequent encounter: Secondary | ICD-10-CM

## 2017-10-24 DIAGNOSIS — K529 Noninfective gastroenteritis and colitis, unspecified: Secondary | ICD-10-CM

## 2017-10-24 DIAGNOSIS — M791 Myalgia, unspecified site: Secondary | ICD-10-CM

## 2017-10-24 DIAGNOSIS — F419 Anxiety disorder, unspecified: Secondary | ICD-10-CM

## 2017-10-24 MED ORDER — BUSPIRONE HCL 5 MG PO TABS: 5.0000 mg | ORAL_TABLET | Freq: Three times a day (TID) | ORAL | 2 refills | Status: DC

## 2017-10-24 NOTE — Assessment & Plan Note (Signed)
Not doing well. Will start some buspar and recheck in 3-4 weeks. Call with any concerns.

## 2017-10-24 NOTE — Progress Notes (Signed)
BP 115/73 (BP Location: Left Arm, Patient Position: Sitting, Cuff Size: Normal)   Pulse 85   Temp 97.6 F (36.4 C)   Wt 214 lb 5 oz (97.2 kg)   LMP 10/07/2017 (Approximate)   SpO2 100%   BMI 40.23 kg/m    Subjective:    Patient ID: Evelena Asa, female    DOB: 06/05/1981, 37 y.o.   MRN: [id number]  HPI: KARALYNE NUSSER is a 37 y.o. female  Chief Complaint  Patient presents with  . Anxiety  . Ankle Pain   ANXIETY/STRESS- started on prozac 9 days ago. Doing signifcantly worse today. She is very tearful today. Hasn't  Duration:worse Anxious mood: yes  Excessive worrying: yes Irritability: yes  Sweating: no Nausea: no Palpitations:yes Hyperventilation: yes Panic attacks: yes Agoraphobia: no  Obscessions/compulsions: no Depressed mood: yes Depression screen [facility] 2/9 10/24/2017 10/15/2017 09/18/2017 03/01/2017 10/22/2016  Decreased Interest 2 1 0 0 3  Down, Depressed, Hopeless 2 2 0 0 3  PHQ - 2 Score 4 3 0 0 6  Altered sleeping 3 3 - - 3  Tired, decreased energy 2 3 - - 3  Change in appetite 2 1 - - 1  Feeling bad or failure about yourself  3 1 - - 1  Trouble concentrating 2 2 - - 1  Moving slowly or fidgety/restless 0 0 - - 1  Suicidal thoughts 1 1 - - 0  PHQ-9 Score 17 14 - - 16  Difficult doing work/chores Very difficult Very difficult - - -   GAD 7 : Generalized Anxiety Score 10/24/2017 10/22/2016  Nervous, Anxious, on Edge 3 2  Control/stop worrying 3 3  Worry too much - different things 3 3  Trouble relaxing 3 3  Restless 1 1  Easily annoyed or irritable 2 3  Afraid - awful might happen 2 1  Total GAD 7 Score 17 16  Anxiety Difficulty Extremely difficult Somewhat difficult   Anhedonia: no Weight changes: no Insomnia: no   Hypersomnia: no Fatigue/loss of energy: yes Feelings of worthlessness: yes Feelings of guilt: yes Impaired concentration/indecisiveness: yes Suicidal ideations: no  Crying spells: yes Recent Stressors/Life Changes: yes   Relationship  problems: no   Family stress: no     Financial stress: yes    Job stress: yes    Recent death/loss: no  Her ankle is still really hurting. She is needing to walk 3 miles a day with her job and finds it very difficult. She had chills and sweats yesterday and is having nausea and diarrhea. Her son is also having the same symptoms.   Relevant past medical, surgical, family and social history reviewed and updated as indicated. Interim medical history since our last visit reviewed. Allergies and medications reviewed and updated.  Review of Systems  Constitutional: Negative.   Gastrointestinal: Positive for diarrhea, nausea and vomiting. Negative for abdominal distention, abdominal pain, anal bleeding, blood in stool, constipation and rectal pain.  Endocrine: Positive for cold intolerance and heat intolerance. Negative for polydipsia, polyphagia and polyuria.  Genitourinary: Negative.   Musculoskeletal: Positive for arthralgias and gait problem. Negative for back pain, joint swelling, myalgias, neck pain and neck stiffness.  Skin: Negative.   Psychiatric/Behavioral: Positive for dysphoric mood. Negative for agitation, behavioral problems, confusion, decreased concentration, hallucinations, self-injury, sleep disturbance and suicidal ideas. The patient is nervous/anxious. The patient is not hyperactive.     Per HPI unless specifically indicated above     Objective:    BP 115/73 (BP  Location: Left Arm, Patient Position: Sitting, Cuff Size: Normal)   Pulse 85   Temp 97.6 F (36.4 C)   Wt 214 lb 5 oz (97.2 kg)   LMP 10/07/2017 (Approximate)   SpO2 100%   BMI 40.23 kg/m   Wt Readings from Last 3 Encounters:  10/24/17 214 lb 5 oz (97.2 kg)  10/15/17 211 lb 2 oz (95.8 kg)  09/18/17 211 lb 12.8 oz (96.1 kg)    Physical Exam  Constitutional: She is oriented to person, place, and time. She appears well-developed and well-nourished. No distress.  HENT:  Head: Normocephalic and atraumatic.    Right Ear: Hearing normal.  Left Ear: Hearing normal.  Nose: Nose normal.  Eyes: Conjunctivae and lids are normal. Right eye exhibits no discharge. Left eye exhibits no discharge. No scleral icterus.  Cardiovascular: Normal rate, regular rhythm, normal heart sounds and intact distal pulses. Exam reveals no gallop and no friction rub.  No murmur heard. Pulmonary/Chest: Effort normal and breath sounds normal. No stridor. No respiratory distress. She has no wheezes. She has no rales. She exhibits no tenderness.  Musculoskeletal: Normal range of motion.  Neurological: She is alert and oriented to person, place, and time.  Skin: Skin is warm, dry and intact. Capillary refill takes less than 2 seconds. No rash noted. She is not diaphoretic. No erythema. No pallor.  Psychiatric: Her speech is normal and behavior is normal. Judgment and thought content normal. Her mood appears anxious. Cognition and memory are normal. She exhibits a depressed mood.    Results for orders placed or performed in visit on 10/15/17  Microscopic Examination  Result Value Ref Range   WBC, UA 0-5 0 - 5 /hpf   RBC, UA 0-2 0 - 2 /hpf   Epithelial Cells (non renal) >10 (A) 0 - 10 /hpf   Bacteria, UA Moderate (A) None seen/Few  Urine Culture, Reflex  Result Value Ref Range   Urine Culture, Routine Final report    Organism ID, Bacteria Comment   CBC with Differential/Platelet  Result Value Ref Range   WBC 5.1 3.4 - 10.8 x10E3/uL   RBC 4.95 3.77 - 5.28 x10E6/uL   Hemoglobin 11.0 (L) 11.1 - 15.9 g/dL   Hematocrit 16.1 09.6 - 46.6 %   MCV 76 (L) 79 - 97 fL   MCH 22.2 (L) 26.6 - 33.0 pg   MCHC 29.4 (L) 31.5 - 35.7 g/dL   RDW 04.5 (H) 40.9 - 81.1 %   Platelets 354 150 - 379 x10E3/uL   Neutrophils 51 Not Estab. %   Lymphs 37 Not Estab. %   Monocytes 9 Not Estab. %   Eos 2 Not Estab. %   Basos 1 Not Estab. %   Neutrophils Absolute 2.6 1.4 - 7.0 x10E3/uL   Lymphocytes Absolute 1.9 0.7 - 3.1 x10E3/uL   Monocytes  Absolute 0.5 0.1 - 0.9 x10E3/uL   EOS (ABSOLUTE) 0.1 0.0 - 0.4 x10E3/uL   Basophils Absolute 0.0 0.0 - 0.2 x10E3/uL   Immature Granulocytes 0 Not Estab. %   Immature Grans (Abs) 0.0 0.0 - 0.1 x10E3/uL  Comprehensive metabolic panel  Result Value Ref Range   Glucose 78 65 - 99 mg/dL   BUN 12 6 - 20 mg/dL   Creatinine, Ser 9.14 0.57 - 1.00 mg/dL   GFR calc non Af Amer 92 >59 mL/min/1.73   GFR calc Af Amer 106 >59 mL/min/1.73   BUN/Creatinine Ratio 15 9 - 23   Sodium 141 134 - 144 mmol/L  Potassium 4.5 3.5 - 5.2 mmol/L   Chloride 101 96 - 106 mmol/L   CO2 22 20 - 29 mmol/L   Calcium 9.3 8.7 - 10.2 mg/dL   Total Protein 7.2 6.0 - 8.5 g/dL   Albumin 4.7 3.5 - 5.5 g/dL   Globulin, Total 2.5 1.5 - 4.5 g/dL   Albumin/Globulin Ratio 1.9 1.2 - 2.2   Bilirubin Total 0.7 0.0 - 1.2 mg/dL   Alkaline Phosphatase 51 39 - 117 IU/L   AST 13 0 - 40 IU/L   ALT 11 0 - 32 IU/L  Lipid Panel w/o Chol/HDL Ratio  Result Value Ref Range   Cholesterol, Total 182 100 - 199 mg/dL   Triglycerides 161 (H) 0 - 149 mg/dL   HDL 60 >09 mg/dL   VLDL Cholesterol Cal 30 5 - 40 mg/dL   LDL Calculated 92 0 - 99 mg/dL  Thyroid Panel With TSH  Result Value Ref Range   TSH 1.500 0.450 - 4.500 uIU/mL   T4, Total 6.8 4.5 - 12.0 ug/dL   T3 Uptake Ratio 25 24 - 39 %   Free Thyroxine Index 1.7 1.2 - 4.9  VITAMIN D 25 Hydroxy (Vit-D Deficiency, Fractures)  Result Value Ref Range   Vit D, 25-Hydroxy 17.1 (L) 30.0 - 100.0 ng/mL  UA/M w/rflx Culture, Routine  Result Value Ref Range   Specific Gravity, UA 1.010 1.005 - 1.030   pH, UA 7.5 5.0 - 7.5   Color, UA Yellow Yellow   Appearance Ur Turbid (A) Clear   Leukocytes, UA Trace (A) Negative   Protein, UA Negative Negative/Trace   Glucose, UA Negative Negative   Ketones, UA Negative Negative   RBC, UA Trace (A) Negative   Bilirubin, UA Negative Negative   Urobilinogen, Ur 0.2 0.2 - 1.0 mg/dL   Nitrite, UA Negative Negative   Microscopic Examination See below:     Urinalysis Reflex Comment       Assessment & Plan:   Problem List Items Addressed This Visit      Other   Anxiety and depression - Primary    Not doing well. Will start some buspar and recheck in 3-4 weeks. Call with any concerns.       Relevant Medications   busPIRone (BUSPAR) 5 MG tablet    Other Visit Diagnoses    Sprain of right ankle, unspecified ligament, subsequent encounter       Continue meloxicam. Stretches. Call if not getting better and we'll get her into ortho. Off ankle as able. Not given.    Myalgia       Will check labs and make sure she's not been bit by a tick. Await results.    Relevant Orders   Lyme Ab/Western Blot Reflex   Rocky mtn spotted fvr abs pnl(IgG+IgM)   Ehrlichia Antibody Panel   Babesia microti Antibody Panel   Gastroenteritis       Rest and fluids. Out of work until Monday. Call if not getting better or getting worse.        Follow up plan: Return 3-4 weeks, for follow up mood.

## 2017-10-26 LAB — EHRLICHIA ANTIBODY PANEL
E. Chaffeensis (HME) IgM Titer: NEGATIVE
E.Chaffeensis (HME) IgG: NEGATIVE
HGE IGG TITER: NEGATIVE
HGE IGM TITER: NEGATIVE

## 2017-10-26 LAB — LYME AB/WESTERN BLOT REFLEX: LYME DISEASE AB, QUANT, IGM: <0.8 index (ref 0.00–0.79)

## 2017-10-26 LAB — ROCKY MTN SPOTTED FVR ABS PNL(IGG+IGM)
RMSF IGM: 0.5 {index} (ref 0.00–0.89)
RMSF IgG: NEGATIVE

## 2017-10-26 LAB — BABESIA MICROTI ANTIBODY PANEL: Babesia microti IgG: <1:10 {titer}

## 2017-10-28 ENCOUNTER — Telehealth: Payer: Self-pay | Admitting: Family Medicine

## 2017-10-28 NOTE — Telephone Encounter (Signed)
Patient notified

## 2017-10-28 NOTE — Telephone Encounter (Signed)
Copied from CRM [id number]. Topic: General - Other >> Oct 28, 2017  3:16 PM Amber [name] wrote: Reason for CRM: Patient said that she was in the office on Wednesday 5/1. She said that Dr Laural Benes wrote her a note for limitations for her foot. She is stating that her job is requiring that paperwork be filed out for her to be on the limitations for 3 weeks. She will be faxing these, just FYI.

## 2017-10-28 NOTE — Telephone Encounter (Signed)
Pleas let her know that she tested negative for all the tick diseases. thanks!

## 2017-11-05 ENCOUNTER — Ambulatory Visit: Payer: BLUE CROSS/BLUE SHIELD | Admitting: Family Medicine

## 2017-11-14 ENCOUNTER — Encounter: Payer: Self-pay | Admitting: Family Medicine

## 2017-11-14 ENCOUNTER — Ambulatory Visit: Payer: BLUE CROSS/BLUE SHIELD | Admitting: Family Medicine

## 2017-11-14 VITALS — BP 114/59 | HR 80 | Temp 98.4°F | Wt 215.8 lb

## 2017-11-14 DIAGNOSIS — F329 Major depressive disorder, single episode, unspecified: Secondary | ICD-10-CM

## 2017-11-14 DIAGNOSIS — F419 Anxiety disorder, unspecified: Secondary | ICD-10-CM | POA: Diagnosis not present

## 2017-11-14 MED ORDER — TRAZODONE HCL 50 MG PO TABS: 25.0000 mg | ORAL_TABLET | Freq: Every evening | ORAL | 3 refills | Status: DC | PRN

## 2017-11-14 MED ORDER — FLUOXETINE HCL 40 MG PO CAPS: 40.0000 mg | ORAL_CAPSULE | Freq: Every day | ORAL | 3 refills | Status: DC

## 2017-11-14 NOTE — Assessment & Plan Note (Signed)
Doing slightly better on the medicine, but not under good control. Will increase her prozac to  and add trazodone for sleep. Continue buspar for anxiety. Call with any concerns. Recheck 1 month.

## 2017-11-14 NOTE — Progress Notes (Signed)
BP (!) 114/59 (BP Location: Right Arm, Patient Position: Sitting, Cuff Size: Normal)   Pulse 80   Temp 98.4 F (36.9 C) (Oral)   Wt 215 lb 12.8 oz (97.9 kg)   SpO2 100%   BMI 40.51 kg/m    Subjective:    Patient ID: Amber [name], female    DOB: 1980/07/03, 37 y.o.   MRN: [id number]  HPI: Amber [name] is a 37 y.o. female  Chief Complaint  Patient presents with  . Depression    Patient states she lost her job  . Anxiety   DEPRESSION Mood status: exacerbated- lost her job since the last time she was here.  Satisfied with current treatment?: no Symptom severity: moderate  Duration of current treatment : 1 month Side effects: no Medication compliance: excellent compliance Psychotherapy/counseling: no  Previous psychiatric medications: prozac, buspar Depressed mood: yes Anxious mood: yes Anhedonia: no Significant weight loss or gain: no Insomnia: yes hard to fall asleep Fatigue: yes Feelings of worthlessness or guilt: yes Impaired concentration/indecisiveness: yes Suicidal ideations: yes Hopelessness: no Crying spells: yes Depression screen [facility] 2/9 11/14/2017 10/24/2017 10/15/2017 09/18/2017 03/01/2017  Decreased Interest 0 0  Down, Depressed, Hopeless 0 0  PHQ - 2 Score 0 0  Altered sleeping - -  Tired, decreased energy - -  Change in appetite - -  Feeling bad or failure about yourself  - -  Trouble concentrating - -  Moving slowly or fidgety/restless 0 0 0 - -  Suicidal thoughts 0 1 1 - -  PHQ-9 Score - -  Difficult doing work/chores Very difficult Very difficult Very difficult - -   GAD 7 : Generalized Anxiety Score 11/14/2017 10/24/2017 10/22/2016  Nervous, Anxious, on Edge Control/stop worrying Worry too much - different things Trouble relaxing Restless Easily annoyed or irritable Afraid - awful might happen Total GAD 7 Score Anxiety Difficulty  Very difficult Extremely difficult Somewhat difficult    Relevant past medical, surgical, family and social history reviewed and updated as indicated. Interim medical history since our last visit reviewed. Allergies and medications reviewed and updated.  Review of Systems  Constitutional: Negative.   Respiratory: Negative.   Cardiovascular: Negative.   Psychiatric/Behavioral: Positive for dysphoric mood and sleep disturbance. Negative for agitation, behavioral problems, confusion, decreased concentration, hallucinations, self-injury and suicidal ideas. The patient is nervous/anxious. The patient is not hyperactive.     Per HPI unless specifically indicated above     Objective:    BP (!) 114/59 (BP Location: Right Arm, Patient Position: Sitting, Cuff Size: Normal)   Pulse 80   Temp 98.4 F (36.9 C) (Oral)   Wt 215 lb 12.8 oz (97.9 kg)   SpO2 100%   BMI 40.51 kg/m   Wt Readings from Last 3 Encounters:  11/14/17 215 lb 12.8 oz (97.9 kg)  10/24/17 214 lb 5 oz (97.2 kg)  10/15/17 211 lb 2 oz (95.8 kg)    Physical Exam  Constitutional: She is oriented to person, place, and time. She appears well-developed and well-nourished. No distress.  HENT:  Head: Normocephalic and atraumatic.  Right Ear: Hearing normal.  Left Ear: Hearing normal.  Nose:  Nose normal.  Eyes: Conjunctivae and lids are normal. Right eye exhibits no discharge. Left eye exhibits no discharge. No scleral icterus.  Cardiovascular: Normal rate, regular rhythm, normal heart sounds and intact distal pulses. Exam reveals no gallop and no friction rub.  No murmur heard. Pulmonary/Chest: Effort normal and breath sounds normal. No stridor. No respiratory distress. She has no wheezes. She has no rales. She exhibits no tenderness.  Musculoskeletal: Normal range of motion.  Neurological: She is alert and oriented to person, place, and time.  Skin: Skin is warm and intact. Capillary refill takes less than 2 seconds. No rash  noted. She is not diaphoretic. No erythema. No pallor.  Psychiatric: She has a normal mood and affect. Her speech is normal and behavior is normal. Judgment and thought content normal. Cognition and memory are normal.  Nursing note and vitals reviewed.   Results for orders placed or performed in visit on 10/24/17  Lyme Ab/Western Blot Reflex  Result Value Ref Range   Lyme IgG/IgM Ab <0.91 0.00 - 0.90 ISR   LYME DISEASE AB, QUANT, IGM <0.80 0.00 - 0.79 index  Rocky mtn spotted fvr abs pnl(IgG+IgM)  Result Value Ref Range   RMSF IgG Negative Negative   RMSF IgM 0.50 0.00 - 0.89 index  Ehrlichia Antibody Panel  Result Value Ref Range   E.Chaffeensis (HME) IgG Negative Neg:<1:64   E. Chaffeensis (HME) IgM Titer Negative Neg:<1:20   HGE IgG Titer Negative Neg:<1:64   HGE IgM Titer Negative Neg:<1:20  Babesia microti Antibody Panel  Result Value Ref Range   Babesia microti IgM <1:10 Neg:<1:10   Babesia microti IgG <1:10 Neg:<1:10      Assessment & Plan:   Problem List Items Addressed This Visit      Other   Anxiety and depression - Primary    Doing slightly better on the medicine, but not under good control. Will increase her prozac to  and add trazodone for sleep. Continue buspar for anxiety. Call with any concerns. Recheck 1 month.       Relevant Medications   FLUoxetine (PROZAC) 40 MG capsule   traZODone (DESYREL) 50 MG tablet       Follow up plan: Return in about 1 month (around 12/12/2017).

## 2017-12-17 ENCOUNTER — Ambulatory Visit: Payer: PRIVATE HEALTH INSURANCE | Admitting: Family Medicine

## 2017-12-17 ENCOUNTER — Encounter: Payer: Self-pay | Admitting: Family Medicine

## 2017-12-17 VITALS — BP 121/75 | HR 89 | Wt 215.0 lb

## 2017-12-17 DIAGNOSIS — F329 Major depressive disorder, single episode, unspecified: Secondary | ICD-10-CM | POA: Diagnosis not present

## 2017-12-17 DIAGNOSIS — G4733 Obstructive sleep apnea (adult) (pediatric): Secondary | ICD-10-CM | POA: Diagnosis not present

## 2017-12-17 DIAGNOSIS — F419 Anxiety disorder, unspecified: Secondary | ICD-10-CM

## 2017-12-17 MED ORDER — ESCITALOPRAM OXALATE 20 MG PO TABS: 20.0000 mg | ORAL_TABLET | Freq: Every day | ORAL | 3 refills | Status: DC

## 2017-12-17 MED ORDER — MELOXICAM 15 MG PO TABS: 15.0000 mg | ORAL_TABLET | Freq: Every day | ORAL | 1 refills | Status: DC

## 2017-12-17 MED ORDER — BUSPIRONE HCL 5 MG PO TABS: 5.0000 mg | ORAL_TABLET | Freq: Three times a day (TID) | ORAL | 1 refills | Status: DC

## 2017-12-17 MED ORDER — TRAZODONE HCL 50 MG PO TABS: 25.0000 mg | ORAL_TABLET | Freq: Every evening | ORAL | 1 refills | Status: DC | PRN

## 2017-12-17 NOTE — Progress Notes (Signed)
BP 121/75   Pulse 89   Wt 215 lb (97.5 kg)   SpO2 98%   BMI 40.36 kg/m    Subjective:    Patient ID: Amber [name], female    DOB: Jul 06, 1980, 37 y.o.   MRN: [id number]  HPI: Amber [name] is a 37 y.o. female  Chief Complaint  Patient presents with  . Follow-up    Patient compains of side effects from medication consisting on h/a and low sex drive  . Depression  . Anxiety   DEPRESSION Mood status: better Satisfied with current treatment?: no Symptom severity: moderate  Duration of current treatment : 2 months Side effects: yes- having lack of libido. Very bothersome Medication compliance: good compliance Psychotherapy/counseling: no  Previous psychiatric medications: prozac, buspar Depressed mood: yes Anxious mood: yes Anhedonia: no Significant weight loss or gain: no Insomnia: yes hard to fall asleep and stay asleep Fatigue: yes Feelings of worthlessness or guilt: yes Impaired concentration/indecisiveness: yes Suicidal ideations: no Hopelessness: no Crying spells: yes Depression screen [facility] 2/9 12/17/2017 11/14/2017 10/24/2017 10/15/2017 09/18/2017  Decreased Interest 1 3 2 1  0  Down, Depressed, Hopeless 2 2 2 2  0  PHQ - 2 Score 3 5 4 3  0  Altered sleeping 1 3 3 3  -  Tired, decreased energy 1 2 2 3  -  Change in appetite 3 3 2 1  -  Feeling bad or failure about yourself  2 2 3 1  -  Trouble concentrating 1 2 2 2  -  Moving slowly or fidgety/restless 1 0 0 0 -  Suicidal thoughts 1 0 1 1 -  PHQ-9 Score 13 17 17 14  -  Difficult doing work/chores Very difficult Very difficult Very difficult Very difficult -   GAD 7 : Generalized Anxiety Score 12/17/2017 11/14/2017 10/24/2017 10/22/2016  Nervous, Anxious, on Edge 1 2 3 2   Control/stop worrying 2 3 3 3   Worry too much - different things 2 3 3 3   Trouble relaxing 1 3 3 3   Restless 1 2 1 1   Easily annoyed or irritable 2 2 2 3   Afraid - awful might happen 2 2 2 1   Total GAD 7 Score 11 17 17 16   Anxiety Difficulty Somewhat  difficult Very difficult Extremely difficult Somewhat difficult   SLEEP APNEA- has not been using her CPAP, needs a new one. No sleep study in 7 years Sleep apnea status: controlled Duration: chronic Satisfied with current treatment?:  no CPAP use:  no Sleep quality with CPAP use: poor Treament compliance:poor compliance Last sleep study: 7 years ago Treatments attempted: CPAP Wakes feeling refreshed:  no Daytime hypersomnolence:  no Fatigue:  no Insomnia:  yes Good sleep hygiene:  no Difficulty falling asleep:  yes Difficulty staying asleep:  yes Snoring bothers bed partner:  yes Observed apnea by bed partner: yes Obesity:  yes Hypertension: no  Pulmonary hypertension:  no Coronary artery disease:  no  WEIGHT GAIN Duration: chronic Previous attempts at weight loss: yes, diet and exercise Complications of obesity: OSA Peak weight: current Weight loss goal: to be healthy Weight loss to date: none Requesting obesity pharmacotherapy: yes Current weight loss supplements/medications: no Previous weight loss supplements/meds: no   Relevant past medical, surgical, family and social history reviewed and updated as indicated. Interim medical history since our last visit reviewed. Allergies and medications reviewed and updated.  Review of Systems  Constitutional: Positive for unexpected weight change. Negative for activity change, appetite change, chills, diaphoresis, fatigue and fever.  Respiratory: Negative.  Cardiovascular: Negative.   Musculoskeletal: Negative.   Skin: Negative.   Psychiatric/Behavioral: Positive for dysphoric mood and sleep disturbance. Negative for agitation, behavioral problems, confusion, decreased concentration, hallucinations, self-injury and suicidal ideas. The patient is nervous/anxious. The patient is not hyperactive.     Per HPI unless specifically indicated above     Objective:    BP 121/75   Pulse 89   Wt 215 lb (97.5 kg)   SpO2 98%    BMI 40.36 kg/m   Wt Readings from Last 3 Encounters:  12/17/17 215 lb (97.5 kg)  11/14/17 215 lb 12.8 oz (97.9 kg)  10/24/17 214 lb 5 oz (97.2 kg)    Physical Exam  Constitutional: She is oriented to person, place, and time. She appears well-developed and well-nourished. No distress.  HENT:  Head: Normocephalic and atraumatic.  Right Ear: Hearing normal.  Left Ear: Hearing normal.  Nose: Nose normal.  Eyes: Conjunctivae and lids are normal. Right eye exhibits no discharge. Left eye exhibits no discharge. No scleral icterus.  Cardiovascular: Normal rate, regular rhythm, normal heart sounds and intact distal pulses. Exam reveals no gallop and no friction rub.  No murmur heard. Pulmonary/Chest: Effort normal and breath sounds normal. No stridor. No respiratory distress. She has no wheezes. She has no rales. She exhibits no tenderness.  Musculoskeletal: Normal range of motion.  Neurological: She is alert and oriented to person, place, and time.  Skin: Skin is warm, dry and intact. Capillary refill takes less than 2 seconds. No rash noted. She is not diaphoretic. No erythema. No pallor.  Psychiatric: She has a normal mood and affect. Her speech is normal and behavior is normal. Judgment and thought content normal. Cognition and memory are normal.  Nursing note and vitals reviewed.   Results for orders placed or performed in visit on 10/24/17  Lyme Ab/Western Blot Reflex  Result Value Ref Range   Lyme IgG/IgM Ab <0.91 0.00 - 0.90 ISR   LYME DISEASE AB, QUANT, IGM <0.80 0.00 - 0.79 index  Rocky mtn spotted fvr abs pnl(IgG+IgM)  Result Value Ref Range   RMSF IgG Negative Negative   RMSF IgM 0.50 0.00 - 0.89 index  Ehrlichia Antibody Panel  Result Value Ref Range   E.Chaffeensis (HME) IgG Negative Neg:<1:64   E. Chaffeensis (HME) IgM Titer Negative Neg:<1:20   HGE IgG Titer Negative Neg:<1:64   HGE IgM Titer Negative Neg:<1:20  Babesia microti Antibody Panel  Result Value Ref Range    Babesia microti IgM <1:10 Neg:<1:10   Babesia microti IgG <1:10 Neg:<1:10      Assessment & Plan:   Problem List Items Addressed This Visit      Respiratory   OSA (obstructive sleep apnea) - Primary    Not using her CPAP. Has not had a sleep study in 7 years. Will get new split night set up for her. Call with any concerns.       Relevant Orders   Ambulatory referral to Sleep Studies     Other   Morbid (severe) obesity due to excess calories (HCC)    Would like to see weight loss specialist. Referral generated today. Would benefit from having her OSA treated. Sleep study ordered today.      Relevant Orders   Amb Referral to Bariatric Surgery   Anxiety and depression    Having side effects from the prozac. Will change to lexapro and recheck 1 month. Call with any concerns. Continue to monitor.       Relevant  Medications   escitalopram (LEXAPRO) 20 MG tablet   busPIRone (BUSPAR) 5 MG tablet   traZODone (DESYREL) 50 MG tablet       Follow up plan: Return in about 1 month (around 01/14/2018) for follow up mood.

## 2017-12-17 NOTE — Assessment & Plan Note (Signed)
Would like to see weight loss specialist. Referral generated today. Would benefit from having her OSA treated. Sleep study ordered today.

## 2017-12-17 NOTE — Assessment & Plan Note (Signed)
Not using her CPAP. Has not had a sleep study in 7 years. Will get new split night set up for her. Call with any concerns.

## 2017-12-17 NOTE — Assessment & Plan Note (Signed)
Having side effects from the prozac. Will change to lexapro and recheck 1 month. Call with any concerns. Continue to monitor.

## 2018-01-20 ENCOUNTER — Ambulatory Visit: Payer: PRIVATE HEALTH INSURANCE | Admitting: Family Medicine

## 2018-01-31 ENCOUNTER — Other Ambulatory Visit: Payer: Self-pay | Admitting: Family Medicine

## 2018-02-02 ENCOUNTER — Other Ambulatory Visit: Payer: Self-pay | Admitting: Family Medicine

## 2018-02-25 ENCOUNTER — Encounter: Payer: Self-pay | Admitting: Family Medicine

## 2018-02-25 ENCOUNTER — Ambulatory Visit: Payer: PRIVATE HEALTH INSURANCE | Admitting: Family Medicine

## 2018-02-25 ENCOUNTER — Other Ambulatory Visit: Payer: Self-pay

## 2018-02-25 VITALS — BP 136/79 | HR 79 | Temp 98.4°F | Ht 62.0 in | Wt 222.0 lb

## 2018-02-25 DIAGNOSIS — F419 Anxiety disorder, unspecified: Secondary | ICD-10-CM | POA: Diagnosis not present

## 2018-02-25 DIAGNOSIS — F329 Major depressive disorder, single episode, unspecified: Secondary | ICD-10-CM

## 2018-02-25 MED ORDER — ESCITALOPRAM OXALATE 20 MG PO TABS: ORAL_TABLET | ORAL | 3 refills | Status: DC

## 2018-02-25 NOTE — Assessment & Plan Note (Signed)
Has not been taking her medicine. Not doing well. Will restart her medicine and recheck 1 month. Call with any concerns.

## 2018-02-25 NOTE — Progress Notes (Signed)
BP 136/79   Pulse 79   Temp 98.4 F (36.9 C) (Oral)   Ht 5\' 2"  (1.575 m)   Wt 222 lb (100.7 kg)   SpO2 98%   BMI 40.60 kg/m    Subjective:    Patient ID: Amber [name], female    DOB: [birth year], 37 y.o.   MRN: [id number]  HPI: Amber [name] is a 37 y.o. female  Chief Complaint  Patient presents with  . Depression    lexapro refill  . Anxiety   DEPRESSION- has been off her medicine for weeks. She notes that her mood has not been doing well.  Mood status: worse Satisfied with current treatment?: no Symptom severity: moderate  Duration of current treatment : months Side effects: no Medication compliance: poor compliance Psychotherapy/counseling: no  Previous psychiatric medications: lexapro Depressed mood: yes Anxious mood: yes Anhedonia: yes Significant weight loss or gain: no Insomnia: yes hard to fall asleep Fatigue: yes Feelings of worthlessness or guilt: yes Impaired concentration/indecisiveness: no Suicidal ideations: no Hopelessness: no Crying spells: yes Depression screen [facility] 2/9 02/25/2018 12/17/2017 11/14/2017 10/24/2017 10/15/2017  Decreased Interest 1 1 3 2 1   Down, Depressed, Hopeless 1 2 2 2 2   PHQ - 2 Score 2 3 5 4 3   Altered sleeping 2 1 3 3 3   Tired, decreased energy 2 1 2 2 3   Change in appetite 1 3 3 2 1   Feeling bad or failure about yourself  2 2 2 3 1   Trouble concentrating 0 1 2 2 2   Moving slowly or fidgety/restless 0 1 0 0 0  Suicidal thoughts 0 1 0 1 1  PHQ-9 Score 9 13 17 17 14   Difficult doing work/chores Very difficult Very difficult Very difficult Very difficult Very difficult   GAD 7 : Generalized Anxiety Score 02/25/2018 12/17/2017 11/14/2017 10/24/2017  Nervous, Anxious, on Edge 1 1 2 3   Control/stop worrying 1 2 3 3   Worry too much - different things 1 2 3 3   Trouble relaxing 1 1 3 3   Restless - 1 2 1   Easily annoyed or irritable 2 2 2 2   Afraid - awful might happen - 2 2 2   Total GAD 7 Score - 11 17 17   Anxiety Difficulty Very  difficult Somewhat difficult Very difficult Extremely difficult    Relevant past medical, surgical, family and social history reviewed and updated as indicated. Interim medical history since our last visit reviewed. Allergies and medications reviewed and updated.  Review of Systems  Constitutional: Negative.   Respiratory: Negative.   Cardiovascular: Negative.   Psychiatric/Behavioral: Negative.     Per HPI unless specifically indicated above     Objective:    BP 136/79   Pulse 79   Temp 98.4 F (36.9 C) (Oral)   Ht 5\' 2"  (1.575 m)   Wt 222 lb (100.7 kg)   SpO2 98%   BMI 40.60 kg/m   Wt Readings from Last 3 Encounters:  02/25/18 222 lb (100.7 kg)  12/17/17 215 lb (97.5 kg)  11/14/17 215 lb 12.8 oz (97.9 kg)    Physical Exam  Constitutional: She is oriented to person, place, and time. She appears well-developed and well-nourished. No distress.  HENT:  Head: Normocephalic and atraumatic.  Right Ear: Hearing normal.  Left Ear: Hearing normal.  Nose: Nose normal.  Eyes: Conjunctivae and lids are normal. Right eye exhibits no discharge. Left eye exhibits no discharge. No scleral icterus.  Cardiovascular: Normal rate, regular rhythm, normal heart  sounds and intact distal pulses. Exam reveals no gallop and no friction rub.  No murmur heard. Pulmonary/Chest: Effort normal and breath sounds normal. No stridor. No respiratory distress. She has no wheezes. She has no rales. She exhibits no tenderness.  Musculoskeletal: Normal range of motion.  Neurological: She is alert and oriented to person, place, and time.  Skin: Skin is warm, dry and intact. Capillary refill takes less than 2 seconds. No rash noted. She is not diaphoretic. No erythema. No pallor.  Psychiatric: Her speech is normal and behavior is normal. Judgment and thought content normal. Cognition and memory are normal. She exhibits a depressed mood.  Nursing note and vitals reviewed.   Results for orders placed or  performed in visit on 10/24/17  Lyme Ab/Western Blot Reflex  Result Value Ref Range   Lyme IgG/IgM Ab <0.91 0.00 - 0.90 ISR   LYME DISEASE AB, QUANT, IGM <0.80 0.00 - 0.79 index  Rocky mtn spotted fvr abs pnl(IgG+IgM)  Result Value Ref Range   RMSF IgG Negative Negative   RMSF IgM 0.50 0.00 - 0.89 index  Ehrlichia Antibody Panel  Result Value Ref Range   E.Chaffeensis (HME) IgG Negative Neg:<1:64   E. Chaffeensis (HME) IgM Titer Negative Neg:<1:20   HGE IgG Titer Negative Neg:<1:64   HGE IgM Titer Negative Neg:<1:20  Babesia microti Antibody Panel  Result Value Ref Range   Babesia microti IgM <1:10 Neg:<1:10   Babesia microti IgG <1:10 Neg:<1:10      Assessment & Plan:   Problem List Items Addressed This Visit      Other   Anxiety and depression - Primary    Has not been taking her medicine. Not doing well. Will restart her medicine and recheck 1 month. Call with any concerns.       Relevant Medications   escitalopram (LEXAPRO) 20 MG tablet       Follow up plan: Return in about 4 weeks (around 03/25/2018) for Follow up mood.

## 2018-03-01 ENCOUNTER — Other Ambulatory Visit: Payer: Self-pay | Admitting: Family Medicine

## 2018-03-05 ENCOUNTER — Other Ambulatory Visit: Payer: Self-pay | Admitting: Family Medicine

## 2018-03-05 NOTE — Telephone Encounter (Signed)
Pended Mobic due to drug alert with  Lexapro. Please advise

## 2018-03-05 NOTE — Telephone Encounter (Signed)
Meloxicam refill Last Refill:12/17/17 # 90 1 refill Last OV: 02/25/18 PCP: Laural Benes Pharmacy:[facility]

## 2018-03-14 ENCOUNTER — Other Ambulatory Visit: Payer: Self-pay

## 2018-03-14 ENCOUNTER — Ambulatory Visit: Payer: PRIVATE HEALTH INSURANCE | Admitting: Family Medicine

## 2018-03-14 ENCOUNTER — Encounter: Payer: Self-pay | Admitting: Family Medicine

## 2018-03-14 VITALS — BP 111/74 | HR 77 | Temp 98.1°F | Ht 62.0 in | Wt 221.0 lb

## 2018-03-14 DIAGNOSIS — J0100 Acute maxillary sinusitis, unspecified: Secondary | ICD-10-CM

## 2018-03-14 MED ORDER — DOXYCYCLINE HYCLATE 100 MG PO TABS: 100.0000 mg | ORAL_TABLET | Freq: Two times a day (BID) | ORAL | 0 refills | Status: DC

## 2018-03-14 MED ORDER — BENZONATATE 200 MG PO CAPS: 200.0000 mg | ORAL_CAPSULE | Freq: Three times a day (TID) | ORAL | 0 refills | Status: DC | PRN

## 2018-03-14 MED ORDER — HYDROCOD POLST-CPM POLST ER 10-8 MG/5ML PO SUER: 5.0000 mL | Freq: Two times a day (BID) | ORAL | 0 refills | Status: DC | PRN

## 2018-03-14 NOTE — Progress Notes (Signed)
BP 111/74   Pulse 77   Temp 98.1 F (36.7 C) (Oral)   Ht 5\' 2"  (1.575 m)   Wt 221 lb (100.2 kg)   SpO2 97%   BMI 40.42 kg/m    Subjective:    Patient ID: Amber [name], female    DOB: 1981-04-07, 37 y.o.   MRN: [id number]  HPI: Amber AsaWendy M Wroblewski is a 37 y.o. female  Chief Complaint  Patient presents with  . Nasal Congestion    pressure around her face and head x 1 week/ have been taking OTC tylenol cold & sinus but not help  . Sinusitis   1 week of facial pain and pressure, congestion, headaches, eye pressure, productive cough. Trying tylenol cold and sinus with no relief. Lots of sick contacts. No fevers, chills, sweats, Cp, SOB.   Relevant past medical, surgical, family and social history reviewed and updated as indicated. Interim medical history since our last visit reviewed. Allergies and medications reviewed and updated.  Review of Systems  Per HPI unless specifically indicated above     Objective:    BP 111/74   Pulse 77   Temp 98.1 F (36.7 C) (Oral)   Ht 5\' 2"  (1.575 m)   Wt 221 lb (100.2 kg)   SpO2 97%   BMI 40.42 kg/m   Wt Readings from Last 3 Encounters:  03/14/18 221 lb (100.2 kg)  02/25/18 222 lb (100.7 kg)  12/17/17 215 lb (97.5 kg)    Physical Exam  Constitutional: She is oriented to person, place, and time. She appears well-developed and well-nourished. No distress.  HENT:  Head: Atraumatic.  B/l middle ear effusion Nasal mucosa and oropharynx erythematous with thick drainage present B/l maxillary sinuses ttp  Eyes: Conjunctivae and EOM are normal.  Neck: Normal range of motion. Neck supple.  Cardiovascular: Normal rate and regular rhythm.  Pulmonary/Chest: Effort normal. She has no wheezes.  Musculoskeletal: Normal range of motion.  Lymphadenopathy:    She has no cervical adenopathy.  Neurological: She is alert and oriented to person, place, and time.  Skin: Skin is warm and dry.  Psychiatric: She has a normal mood and affect. Her  behavior is normal.  Nursing note and vitals reviewed.   Results for orders placed or performed in visit on 10/24/17  Lyme Ab/Western Blot Reflex  Result Value Ref Range   Lyme IgG/IgM Ab <0.91 0.00 - 0.90 ISR   LYME DISEASE AB, QUANT, IGM <0.80 0.00 - 0.79 index  Rocky mtn spotted fvr abs pnl(IgG+IgM)  Result Value Ref Range   RMSF IgG Negative Negative   RMSF IgM 0.50 0.00 - 0.89 index  Ehrlichia Antibody Panel  Result Value Ref Range   E.Chaffeensis (HME) IgG Negative Neg:<1:64   E. Chaffeensis (HME) IgM Titer Negative Neg:<1:20   HGE IgG Titer Negative Neg:<1:64   HGE IgM Titer Negative Neg:<1:20  Babesia microti Antibody Panel  Result Value Ref Range   Babesia microti IgM <1:10 Neg:<1:10   Babesia microti IgG <1:10 Neg:<1:10      Assessment & Plan:   Problem List Items Addressed This Visit    None    Visit Diagnoses    Acute maxillary sinusitis, recurrence not specified    -  Primary   Doxycycline, tessalon, tussionex, mucinex, sinus rinses, humidifier. F/u if worsening or not improving. Sedation precautions reviewed with tussionex   Relevant Medications   benzonatate (TESSALON) 200 MG capsule   chlorpheniramine-HYDROcodone (TUSSIONEX PENNKINETIC ER) 10-8 MG/5ML SUER   doxycycline (  VIBRA-TABS) 100 MG tablet       Follow up plan: Return if symptoms worsen or fail to improve.

## 2018-03-17 NOTE — Patient Instructions (Signed)
Follow up as needed

## 2018-03-24 ENCOUNTER — Telehealth: Payer: Self-pay | Admitting: Family Medicine

## 2018-03-24 DIAGNOSIS — Z111 Encounter for screening for respiratory tuberculosis: Secondary | ICD-10-CM

## 2018-03-24 NOTE — Telephone Encounter (Signed)
Will you order lab? Please advise.

## 2018-03-24 NOTE — Telephone Encounter (Signed)
Patient is calling and states she will come in at 8am on Wednesday 03/26/18.

## 2018-03-24 NOTE — Telephone Encounter (Signed)
Order in.

## 2018-03-24 NOTE — Telephone Encounter (Signed)
Just FYI.

## 2018-03-24 NOTE — Telephone Encounter (Signed)
Called patient and let her know she can come in for labs.

## 2018-03-24 NOTE — Telephone Encounter (Signed)
Copied from CRM [id number]. Topic: Quick Communication - See Telephone Encounter >> Mar 24, 2018 10:52 AM Jolayne Haines L wrote: CRM for notification. See Telephone encounter for: 03/24/18.  Patient is requesting a Amber [name] ( [employer]	[place] ) for employment. Can the order be place for the patient? Patient will not be in town tomorrow.

## 2018-03-26 ENCOUNTER — Other Ambulatory Visit: Payer: PRIVATE HEALTH INSURANCE

## 2018-03-26 ENCOUNTER — Ambulatory Visit (INDEPENDENT_AMBULATORY_CARE_PROVIDER_SITE_OTHER): Payer: PRIVATE HEALTH INSURANCE

## 2018-03-26 DIAGNOSIS — Z111 Encounter for screening for respiratory tuberculosis: Secondary | ICD-10-CM

## 2018-03-26 DIAGNOSIS — Z23 Encounter for immunization: Secondary | ICD-10-CM | POA: Diagnosis not present

## 2018-04-01 ENCOUNTER — Ambulatory Visit: Payer: PRIVATE HEALTH INSURANCE | Admitting: Family Medicine

## 2018-04-02 ENCOUNTER — Other Ambulatory Visit: Payer: Self-pay | Admitting: Family Medicine

## 2018-04-02 NOTE — Telephone Encounter (Signed)
Requested medication (s) are due for refill today: yes  Requested medication (s) are on the active medication list: yes  Last refill:  03/03/18  Future visit scheduled: no  Notes to clinic:  Undelegated; no UDS in past 360 days    Requested Prescriptions  Pending Prescriptions Disp Refills   busPIRone (BUSPAR) 5 MG tablet [Pharmacy Med Name: BUSPIRONE HCL 5 MG TABLET] 810 tablet 1    Sig: TAKE 1-3 TABLETS (5-15 MG TOTAL) BY MOUTH 3 (THREE) TIMES DAILY.     Not Delegated - Psychiatry:  Anxiolytics/Hypnotics Failed - 04/02/2018 10:32 AM      Failed - This refill cannot be delegated      Failed - Urine Drug Screen completed in last 360 days.      Passed - Valid encounter within last 6 months    Recent Outpatient Visits          2 weeks ago Acute maxillary sinusitis, recurrence not specified   [facility] [place], [place]   1 month ago Anxiety and depression   [facility] [place], Megan P, [place]   3 months ago OSA (obstructive sleep apnea)   [facility], Megan P, DO   4 months ago Anxiety and depression   [facility] [place], [place], [place]   5 months ago Anxiety and depression   [facility] [place], [place], [place]

## 2018-06-30 ENCOUNTER — Ambulatory Visit (INDEPENDENT_AMBULATORY_CARE_PROVIDER_SITE_OTHER): Payer: PRIVATE HEALTH INSURANCE | Admitting: Family Medicine

## 2018-06-30 ENCOUNTER — Encounter: Payer: Self-pay | Admitting: Family Medicine

## 2018-06-30 VITALS — BP 138/78 | HR 79 | Temp 98.7°F | Ht 62.3 in | Wt 226.2 lb

## 2018-06-30 DIAGNOSIS — F329 Major depressive disorder, single episode, unspecified: Secondary | ICD-10-CM

## 2018-06-30 DIAGNOSIS — F419 Anxiety disorder, unspecified: Secondary | ICD-10-CM

## 2018-06-30 DIAGNOSIS — Z0000 Encounter for general adult medical examination without abnormal findings: Secondary | ICD-10-CM | POA: Diagnosis not present

## 2018-06-30 LAB — UA/M W/RFLX CULTURE, ROUTINE
Bilirubin, UA: NEGATIVE
Glucose, UA: NEGATIVE
Ketones, UA: NEGATIVE
Leukocytes, UA: NEGATIVE
NITRITE UA: NEGATIVE
Protein, UA: NEGATIVE
RBC, UA: NEGATIVE
Specific Gravity, UA: 1.01 (ref 1.005–1.030)
Urobilinogen, Ur: 0.2 mg/dL (ref 0.2–1.0)
pH, UA: 6.5 (ref 5.0–7.5)

## 2018-06-30 MED ORDER — ESCITALOPRAM OXALATE 20 MG PO TABS: 20.0000 mg | ORAL_TABLET | Freq: Every day | ORAL | 1 refills | Status: DC

## 2018-06-30 MED ORDER — MELOXICAM 15 MG PO TABS: 15.0000 mg | ORAL_TABLET | Freq: Every day | ORAL | 1 refills | Status: DC

## 2018-06-30 MED ORDER — BUSPIRONE HCL 5 MG PO TABS: 5.0000 mg | ORAL_TABLET | Freq: Three times a day (TID) | ORAL | 1 refills | Status: DC

## 2018-06-30 MED ORDER — TRAZODONE HCL 50 MG PO TABS: 25.0000 mg | ORAL_TABLET | Freq: Every evening | ORAL | 1 refills | Status: DC | PRN

## 2018-06-30 NOTE — Assessment & Plan Note (Signed)
Encouraged slow, steady weight loss of 1-2lbs a week. Call with any concerns.  

## 2018-06-30 NOTE — Progress Notes (Signed)
BP 138/78   Pulse 79   Temp 98.7 F (37.1 C) (Oral)   Ht 5' 2.3" (1.582 m)   Wt 226 lb 3.2 oz (102.6 kg)   LMP 06/09/2018 (Approximate)   SpO2 98%   BMI 40.98 kg/m    Subjective:    Patient ID: Amber [name], female    DOB: 05-Jan-1981, 38 y.o.   MRN: [id number]  HPI: Amber [name] is a 38 y.o. female presenting on 06/30/2018 for comprehensive medical examination. Current medical complaints include:  ANXIETY/STRESS- unsure if the lexapro is helping Duration:stable Anxious mood: yes  Excessive worrying: yes Irritability: yes  Sweating: no Nausea: no Palpitations:no Hyperventilation: no Panic attacks: no Agoraphobia: no  Obscessions/compulsions: no Depressed mood: yes [facility] 06/30/2018 02/25/2018 12/17/2017 11/14/2017 10/24/2017  Decreased Interest 1 1 1 3 2   Down, Depressed, Hopeless 1 1 2 2 2   PHQ - 2 Score 2 2 3 5 4   Altered sleeping 1 2 1 3 3   Tired, decreased energy 1 2 1 2 2   Change in appetite 2 1 3 3 2   Feeling bad or failure about yourself  2 2 2 2 3   Trouble concentrating 1 0 1 2 2   Moving slowly or fidgety/restless 0 0 1 0 0  Suicidal thoughts 1 0 1 0 1  PHQ-9 Score 10 9 13 17 17   Difficult doing work/chores Somewhat difficult Very difficult Very difficult Very difficult Very difficult  Some recent data might be hidden   GAD 7 : Generalized Anxiety Score 06/30/2018 02/25/2018 12/17/2017 11/14/2017  Nervous, Anxious, on Edge 1 1 1 2   Control/stop worrying 2 1 2 3   Worry too much - different things 2 1 2 3   Trouble relaxing 1 1 1 3   Restless 1 - 1 2  Easily annoyed or irritable 1 2 2 2   Afraid - awful might happen 1 - 2 2  Total GAD 7 Score 9 - 11 17  Anxiety Difficulty Somewhat difficult Very difficult Somewhat difficult Very difficult   Anhedonia: no Weight changes: no Insomnia: yes hard to fall asleep  Hypersomnia: no Fatigue/loss of energy: yes Feelings of worthlessness: no Feelings of guilt: no Impaired concentration/indecisiveness:  no Suicidal ideations: no  Crying spells: yes Recent Stressors/Life Changes: yes   Relationship problems: no   Family stress: no     Financial stress: yes    Job stress: yes    Recent death/loss: no  She currently lives with: husband and kids  Depression Screen done today and results listed below:  [facility] 06/30/2018 02/25/2018 12/17/2017 11/14/2017 10/24/2017  Decreased Interest 1 1 1 3 2   Down, Depressed, Hopeless 1 1 2 2 2   PHQ - 2 Score 2 2 3 5 4   Altered sleeping 1 2 1 3 3   Tired, decreased energy 1 2 1 2 2   Change in appetite 2 1 3 3 2   Feeling bad or failure about yourself  2 2 2 2 3   Trouble concentrating 1 0 1 2 2   Moving slowly or fidgety/restless 0 0 1 0 0  Suicidal thoughts 1 0 1 0 1  PHQ-9 Score 10 9 13 17 17   Difficult doing work/chores Somewhat difficult Very difficult Very difficult Very difficult Very difficult  Some recent data might be hidden    Past Medical History:  Past Medical History:  Diagnosis Date  . Anemia   . Anxiety   . Congenital malrotation of intestine   . Depression   .  GERD (gastroesophageal reflux disease)    OCC  . Headache    H/O  . Obesity (BMI 30-39.9)   . OSA (obstructive sleep apnea)    USE CPAP    Surgical History:  Past Surgical History:  Procedure Laterality Date  . BREAST REDUCTION SURGERY  12/18/02  . CESAREAN SECTION     X 2  . CESAREAN SECTION WITH BILATERAL TUBAL LIGATION N/A 08/04/2015   Procedure: CESAREAN SECTION WITH BILATERAL TUBAL LIGATION;  Surgeon: Nadara Mustardobert P Harris, MD;  Location: [facility] ORS;  Service: Obstetrics;  Laterality: N/A;  . LAPAROSCOPIC CHOLECYSTECTOMY W/ CHOLANGIOGRAPHY  01/22/11  . LESION REMOVAL N/A 05/04/2016   Procedure: EXCISION VAGINAL LESION;  Surgeon: Nadara Mustardobert P Harris, MD;  Location: [facility] ORS;  Service: Gynecology;  Laterality: N/A;  . NASAL SINUS SURGERY  05/26/03  . SLEEVE GASTROPLASTY    . TUBAL LIGATION      Medications:  Current Outpatient Medications on File Prior to  Visit  Medication Sig  . Multiple Vitamin (MULTI-VITAMINS) TABS Take by mouth.   No current facility-administered medications on file prior to visit.     Allergies:  Allergies  Allergen Reactions  . Penicillins Hives and Swelling    Has patient had a PCN reaction causing immediate rash, facial/tongue/throat swelling, SOB or lightheadedness with hypotension:No  Has patient had a PCN reaction causing severe rash involving mucus membranes or skin necrosis:Yes Has patient had a PCN reaction that required hospitalization:No Has patient had a PCN reaction occurring within the last 10 years:No If all of the above answers are "NO", then may proceed with Cephalosporin use.   . Wellbutrin [Bupropion] Other (See Comments)    Headaches  . Sulfa Antibiotics Hives and Swelling    Social History:  Social History   Socioeconomic History  . Marital status: Married    Spouse name: Not on file  . Number of children: Not on file  . Years of education: Not on file  . Highest education level: Not on file  Occupational History  . Not on file  Social Needs  . Financial resource strain: Not on file  . Food insecurity:    Worry: Not on file    Inability: Not on file  . Transportation needs:    Medical: Not on file    Non-medical: Not on file  Tobacco Use  . Smoking status: Former Smoker    Packs/day: 0.50    Years: 2.00    Pack years: 1.00    Types: Cigarettes    Last attempt to quit: 01/05/2003    Years since quitting: 15.4  . Smokeless tobacco: Never Used  Substance and Sexual Activity  . Alcohol use: No    Alcohol/week: 0.0 standard drinks    Comment: "occasionally"  . Drug use: No  . Sexual activity: Yes    Birth control/protection: Surgical  Lifestyle  . Physical activity:    Days per week: 2 days    Minutes per session: 30 min  . Stress: Rather much  Relationships  . Social connections:    Talks on phone: More than three times a week    Gets together: Once a week     Attends religious service: More than 4 times per year    Active member of club or organization: No    Attends meetings of clubs or organizations: Never    Relationship status: Married  . Intimate partner violence:    Fear of current or ex partner: No    Emotionally abused: No  Physically abused: No    Forced sexual activity: No  Other Topics Concern  . Not on file  Social History Narrative   Married.   2 children last kid had in 2017    Works in Clinical biochemist.   Enjoys spending time with family.   Social History   Tobacco Use  Smoking Status Former Smoker  . Packs/day: 0.50  . Years: 2.00  . Pack years: 1.00  . Types: Cigarettes  . Last attempt to quit: 01/05/2003  . Years since quitting: 15.4  Smokeless Tobacco Never Used   Social History   Substance and Sexual Activity  Alcohol Use No  . Alcohol/week: 0.0 standard drinks   Comment: "occasionally"    Family History:  Family History  Problem Relation Age of Onset  . Hypertension Mother   . Diabetes Mother   . Hypertension Brother   . Leukemia Maternal Grandmother 40  . Diabetes Maternal Grandfather   . COPD Maternal Grandfather   . Heart disease Maternal Grandfather     Past medical history, surgical history, medications, allergies, family history and social history reviewed with patient today and changes made to appropriate areas of the chart.   Review of Systems  Constitutional: Negative.   HENT: Positive for congestion. Negative for ear discharge, ear pain, hearing loss, nosebleeds, sinus pain, sore throat and tinnitus.   Eyes: Negative.   Respiratory: Positive for cough (at night). Negative for hemoptysis, sputum production, shortness of breath, wheezing and stridor.   Cardiovascular: Negative.   Gastrointestinal: Positive for constipation and heartburn (with food choices). Negative for abdominal pain, blood in stool, diarrhea, melena, nausea and vomiting.  Genitourinary: Positive for frequency.  Negative for dysuria, flank pain, hematuria and urgency.  Musculoskeletal: Negative.   Skin: Negative.   Neurological: Positive for dizziness. Negative for tingling, tremors, sensory change, speech change, focal weakness, seizures, loss of consciousness, weakness and headaches.  Endo/Heme/Allergies: Positive for polydipsia. Negative for environmental allergies. Does not bruise/bleed easily.  Psychiatric/Behavioral: Negative for depression, hallucinations, memory loss, substance abuse and suicidal ideas. The patient is nervous/anxious. The patient does not have insomnia.     All other ROS negative except what is listed above and in the HPI.      Objective:    BP 138/78   Pulse 79   Temp 98.7 F (37.1 C) (Oral)   Ht 5' 2.3" (1.582 m)   Wt 226 lb 3.2 oz (102.6 kg)   LMP 06/09/2018 (Approximate)   SpO2 98%   BMI 40.98 kg/m   Wt Readings from Last 3 Encounters:  06/30/18 226 lb 3.2 oz (102.6 kg)  03/14/18 221 lb (100.2 kg)  02/25/18 222 lb (100.7 kg)    Physical Exam Vitals signs and nursing note reviewed.  Constitutional:      General: She is not in acute distress.    Appearance: Normal appearance. She is not ill-appearing, toxic-appearing or diaphoretic.  HENT:     Head: Normocephalic and atraumatic.     Right Ear: Tympanic membrane, ear canal and external ear normal. There is no impacted cerumen.     Left Ear: Tympanic membrane, ear canal and external ear normal. There is no impacted cerumen.     Nose: Nose normal. No congestion or rhinorrhea.     Mouth/Throat:     Mouth: Mucous membranes are moist.     Pharynx: Oropharynx is clear. No oropharyngeal exudate or posterior oropharyngeal erythema.  Eyes:     General: No scleral icterus.  Right eye: No discharge.        Left eye: No discharge.     Extraocular Movements: Extraocular movements intact.     Conjunctiva/sclera: Conjunctivae normal.     Pupils: Pupils are equal, round, and reactive to light.  Neck:      Musculoskeletal: Normal range of motion and neck supple. No neck rigidity or muscular tenderness.     Vascular: No carotid bruit.  Cardiovascular:     Rate and Rhythm: Normal rate and regular rhythm.     Pulses: Normal pulses.     Heart sounds: No murmur. No friction rub. No gallop.   Pulmonary:     Effort: Pulmonary effort is normal. No respiratory distress.     Breath sounds: Normal breath sounds. No stridor. No wheezing, rhonchi or rales.  Chest:     Chest wall: No tenderness.     Breasts:        Right: Normal. No swelling, bleeding, inverted nipple, mass, nipple discharge, skin change or tenderness.        Left: Normal. No swelling, bleeding, inverted nipple, mass, nipple discharge, skin change or tenderness.  Abdominal:     General: Abdomen is flat. Bowel sounds are normal. There is no distension.     Palpations: Abdomen is soft. There is no mass.     Tenderness: There is no abdominal tenderness. There is no right CVA tenderness, left CVA tenderness, guarding or rebound.     Hernia: No hernia is present.  Genitourinary:    Comments: Pelvic exams deferred with shared decision making Musculoskeletal:        General: No swelling, tenderness, deformity or signs of injury.     Right lower leg: No edema.     Left lower leg: No edema.  Lymphadenopathy:     Cervical: No cervical adenopathy.  Skin:    General: Skin is warm and dry.     Capillary Refill: Capillary refill takes less than 2 seconds.     Coloration: Skin is not jaundiced or pale.     Findings: No bruising, erythema, lesion or rash.  Neurological:     General: No focal deficit present.     Mental Status: She is alert and oriented to person, place, and time. Mental status is at baseline.     Cranial Nerves: No cranial nerve deficit.     Sensory: No sensory deficit.     Motor: No weakness.     Coordination: Coordination normal.     Gait: Gait normal.     Deep Tendon Reflexes: Reflexes normal.  Psychiatric:        Mood  and Affect: Mood normal.        Behavior: Behavior normal.        Thought Content: Thought content normal.        Judgment: Judgment normal.     Results for orders placed or performed in visit on 10/24/17  Lyme Ab/Western Blot Reflex  Result Value Ref Range   Lyme IgG/IgM Ab <0.91 0.00 - 0.90 ISR   LYME DISEASE AB, QUANT, IGM <0.80 0.00 - 0.79 index  Rocky mtn spotted fvr abs pnl(IgG+IgM)  Result Value Ref Range   RMSF IgG Negative Negative   RMSF IgM 0.50 0.00 - 0.89 index  Ehrlichia Antibody Panel  Result Value Ref Range   E.Chaffeensis (HME) IgG Negative Neg:<1:64   E. Chaffeensis (HME) IgM Titer Negative Neg:<1:20   HGE IgG Titer Negative Neg:<1:64   HGE IgM Titer Negative Neg:<1:20  Babesia microti Antibody Panel  Result Value Ref Range   Babesia microti IgM <1:10 Neg:<1:10   Babesia microti IgG <1:10 Neg:<1:10      Assessment & Plan:   Problem List Items Addressed This Visit      Other   Morbid (severe) obesity due to excess calories (HCC)    Encouraged slow, steady weight loss of 1-2lbs a week. Call with any concerns.       Anxiety and depression    Stable. Not doing great due to situational issues. Medicine helping. Will call if she wants to add another medicine in. Continue to monitor. Refills given today.      Relevant Medications   traZODone (DESYREL) 50 MG tablet   escitalopram (LEXAPRO) 20 MG tablet   busPIRone (BUSPAR) 5 MG tablet    Other Visit Diagnoses    Routine general medical examination at a health care facility    -  Primary   Vaccines up to date. Screening labs checked today. Pap up to date. Continue diet and exercise. Call with any concerns.    Relevant Orders   CBC with Differential/Platelet   Comprehensive metabolic panel   Lipid Panel w/o Chol/HDL Ratio   TSH   UA/M w/rflx Culture, Routine       Follow up plan: Return in about 6 months (around 12/29/2018) for follow up mood.   LABORATORY TESTING:  - Pap smear: not  applicable  IMMUNIZATIONS:   - Tdap: Tetanus vaccination status reviewed: last tetanus booster within 10 years. - Influenza: Up to date - Pneumovax: Not applicable   PATIENT COUNSELING:   Advised to take 1 mg of folate supplement per day if capable of pregnancy.   Sexuality: Discussed sexually transmitted diseases, partner selection, use of condoms, avoidance of unintended pregnancy  and contraceptive alternatives.   Advised to avoid cigarette smoking.  I discussed with the patient that most people either abstain from alcohol or drink within safe limits (<=14/week and <=4 drinks/occasion for males, <=7/weeks and <= 3 drinks/occasion for females) and that the risk for alcohol disorders and other health effects rises proportionally with the number of drinks per week and how often a drinker exceeds daily limits.  Discussed cessation/primary prevention of drug use and availability of treatment for abuse.   Diet: Encouraged to adjust caloric intake to maintain  or achieve ideal body weight, to reduce intake of dietary saturated fat and total fat, to limit sodium intake by avoiding high sodium foods and not adding table salt, and to maintain adequate dietary potassium and calcium preferably from fresh fruits, vegetables, and low-fat dairy products.    stressed the importance of regular exercise  Injury prevention: Discussed safety belts, safety helmets, smoke detector, smoking near bedding or upholstery.   Dental health: Discussed importance of regular tooth brushing, flossing, and dental visits.    NEXT PREVENTATIVE PHYSICAL DUE IN 1 YEAR. Return in about 6 months (around 12/29/2018) for follow up mood.

## 2018-06-30 NOTE — Assessment & Plan Note (Signed)
Stable. Not doing great due to situational issues. Medicine helping. Will call if she wants to add another medicine in. Continue to monitor. Refills given today.

## 2018-06-30 NOTE — Patient Instructions (Signed)

## 2018-07-01 LAB — CBC WITH DIFFERENTIAL/PLATELET
Basophils Absolute: 0 10*3/uL (ref 0.0–0.2)
Basos: 1 %
EOS (ABSOLUTE): 0.1 10*3/uL (ref 0.0–0.4)
EOS: 1 %
HEMATOCRIT: 32.5 % — AB (ref 34.0–46.6)
HEMOGLOBIN: 10.1 g/dL — AB (ref 11.1–15.9)
IMMATURE GRANS (ABS): 0 10*3/uL (ref 0.0–0.1)
Immature Granulocytes: 0 %
LYMPHS ABS: 1.6 10*3/uL (ref 0.7–3.1)
LYMPHS: 31 %
MCH: 23.7 pg — AB (ref 26.6–33.0)
MCHC: 31.1 g/dL — AB (ref 31.5–35.7)
MCV: 76 fL — ABNORMAL LOW (ref 79–97)
MONOCYTES: 8 %
Monocytes Absolute: 0.4 10*3/uL (ref 0.1–0.9)
Neutrophils Absolute: 3.1 10*3/uL (ref 1.4–7.0)
Neutrophils: 59 %
Platelets: 239 10*3/uL (ref 150–450)
RBC: 4.26 x10E6/uL (ref 3.77–5.28)
RDW: 17.1 % — ABNORMAL HIGH (ref 11.7–15.4)
WBC: 5.2 10*3/uL (ref 3.4–10.8)

## 2018-07-01 LAB — COMPREHENSIVE METABOLIC PANEL
ALBUMIN: 4.5 g/dL (ref 3.5–5.5)
ALK PHOS: 44 IU/L (ref 39–117)
ALT: 25 IU/L (ref 0–32)
AST: 23 IU/L (ref 0–40)
Albumin/Globulin Ratio: 1.9 (ref 1.2–2.2)
BUN / CREAT RATIO: 15 (ref 9–23)
BUN: 13 mg/dL (ref 6–20)
Bilirubin Total: 0.4 mg/dL (ref 0.0–1.2)
CO2: 21 mmol/L (ref 20–29)
CREATININE: 0.84 mg/dL (ref 0.57–1.00)
Calcium: 9.1 mg/dL (ref 8.7–10.2)
Chloride: 101 mmol/L (ref 96–106)
GFR calc non Af Amer: 88 mL/min/{1.73_m2} (ref >59)
GFR, EST AFRICAN AMERICAN: 101 mL/min/{1.73_m2} (ref >59)
GLOBULIN, TOTAL: 2.4 g/dL (ref 1.5–4.5)
GLUCOSE: 78 mg/dL (ref 65–99)
Potassium: 4.1 mmol/L (ref 3.5–5.2)
SODIUM: 138 mmol/L (ref 134–144)
TOTAL PROTEIN: 6.9 g/dL (ref 6.0–8.5)

## 2018-07-01 LAB — LIPID PANEL W/O CHOL/HDL RATIO
CHOLESTEROL TOTAL: 180 mg/dL (ref 100–199)
HDL: 50 mg/dL (ref >39)
LDL CALC: 74 mg/dL (ref 0–99)
Triglycerides: 281 mg/dL — ABNORMAL HIGH (ref 0–149)
VLDL Cholesterol Cal: 56 mg/dL — ABNORMAL HIGH (ref 5–40)

## 2018-07-01 LAB — TSH: TSH: 1.75 u[IU]/mL (ref 0.450–4.500)

## 2018-07-03 ENCOUNTER — Telehealth: Payer: Self-pay | Admitting: Family Medicine

## 2018-07-03 DIAGNOSIS — D649 Anemia, unspecified: Secondary | ICD-10-CM

## 2018-07-03 MED ORDER — FERROUS SULFATE 325 (65 FE) MG PO TABS: 325.0000 mg | ORAL_TABLET | Freq: Two times a day (BID) | ORAL | 3 refills | Status: DC

## 2018-07-03 NOTE — Telephone Encounter (Signed)
Pt called back. Message relayed to patient. Verbalized understanding and denied questions.

## 2018-07-03 NOTE — Telephone Encounter (Signed)
VM box is full. Could not leave message will call back.

## 2018-07-03 NOTE — Telephone Encounter (Signed)
Please let Amber [name] know that her labs look nice and normal, except that she's a little anemic. I've sent her through an iron supplement and I'd like her to come back in 1 month for a lab recheck so we can make sure she's getting better. Orders in.

## 2018-07-18 ENCOUNTER — Other Ambulatory Visit: Payer: Self-pay

## 2018-07-18 ENCOUNTER — Encounter: Payer: Self-pay | Admitting: Family Medicine

## 2018-07-18 ENCOUNTER — Ambulatory Visit: Payer: PRIVATE HEALTH INSURANCE | Admitting: Family Medicine

## 2018-07-18 VITALS — BP 120/74 | HR 79 | Temp 98.4°F | Ht 62.0 in | Wt 224.0 lb

## 2018-07-18 DIAGNOSIS — R52 Pain, unspecified: Secondary | ICD-10-CM

## 2018-07-18 DIAGNOSIS — J069 Acute upper respiratory infection, unspecified: Secondary | ICD-10-CM | POA: Diagnosis not present

## 2018-07-18 LAB — VERITOR FLU A/B WAIVED
Influenza A: NEGATIVE
Influenza B: NEGATIVE

## 2018-07-18 MED ORDER — PREDNISONE 50 MG PO TABS: 50.0000 mg | ORAL_TABLET | Freq: Every day | ORAL | 0 refills | Status: DC

## 2018-07-18 MED ORDER — BENZONATATE 200 MG PO CAPS: 200.0000 mg | ORAL_CAPSULE | Freq: Two times a day (BID) | ORAL | 0 refills | Status: DC | PRN

## 2018-07-18 MED ORDER — HYDROCOD POLST-CPM POLST ER 10-8 MG/5ML PO SUER: 5.0000 mL | Freq: Every evening | ORAL | 0 refills | Status: DC | PRN

## 2018-07-18 NOTE — Progress Notes (Signed)
BP 120/74   Pulse 79   Temp 98.4 F (36.9 C) (Oral)   Ht 5\' 2"  (1.575 m)   Wt 224 lb (101.6 kg)   SpO2 97%   BMI 40.97 kg/m    Subjective:    Patient ID: Amber [name], female    DOB: 24-Jul-1980, 38 y.o.   MRN: [id number]  HPI: Amber AsaWendy M King is a 38 y.o. female  Chief Complaint  Patient presents with  . Generalized Body Aches    x over 3 days. pt has tried OTC tylenol sinus and nyquil  . Headache  . Nasal Congestion  . Cough   UPPER RESPIRATORY TRACT INFECTION Duration: 3 days Worst symptom: congestion Fever: no Cough: yes Shortness of breath: no Wheezing: no Chest pain: no Chest tightness: no Chest congestion: no Nasal congestion: yes Runny nose: yes Post nasal drip: yes Sneezing: yes Sore throat: no Swollen glands: no Sinus pressure: yes Headache: yes Face pain: yes Toothache: no Ear pain: no  Ear pressure: no  Eyes red/itching:no Eye drainage/crusting: no  Vomiting: no Rash: no Fatigue: yes Sick contacts: yes Strep contacts: no  Context: worse Recurrent sinusitis: no Relief with OTC cold/cough medications: no  Treatments attempted: cold/sinus and anti-histamine   Relevant past medical, surgical, family and social history reviewed and updated as indicated. Interim medical history since our last visit reviewed. Allergies and medications reviewed and updated.  Review of Systems  Constitutional: Positive for chills and fatigue. Negative for activity change, appetite change, diaphoresis, fever and unexpected weight change.  HENT: Positive for congestion, postnasal drip, rhinorrhea and sore throat. Negative for dental problem, drooling, ear discharge, ear pain, facial swelling, hearing loss, mouth sores, nosebleeds, sinus pressure, sinus pain, sneezing, tinnitus, trouble swallowing and voice change.   Eyes: Negative.   Respiratory: Positive for cough and chest tightness. Negative for apnea, choking, shortness of breath, wheezing and stridor.     Cardiovascular: Negative.   Psychiatric/Behavioral: Negative.     Per HPI unless specifically indicated above     Objective:    BP 120/74   Pulse 79   Temp 98.4 F (36.9 C) (Oral)   Ht 5\' 2"  (1.575 m)   Wt 224 lb (101.6 kg)   SpO2 97%   BMI 40.97 kg/m   Wt Readings from Last 3 Encounters:  07/18/18 224 lb (101.6 kg)  06/30/18 226 lb 3.2 oz (102.6 kg)  03/14/18 221 lb (100.2 kg)    Physical Exam Vitals signs and nursing note reviewed.  Constitutional:      General: She is not in acute distress.    Appearance: Normal appearance. She is not ill-appearing, toxic-appearing or diaphoretic.  HENT:     Head: Normocephalic and atraumatic.     Right Ear: Tympanic membrane, ear canal and external ear normal. There is no impacted cerumen.     Left Ear: Tympanic membrane, ear canal and external ear normal. There is no impacted cerumen.     Nose: Congestion and rhinorrhea present.     Mouth/Throat:     Mouth: Mucous membranes are moist.     Pharynx: Oropharynx is clear. No oropharyngeal exudate or posterior oropharyngeal erythema.  Eyes:     General: No scleral icterus.       Right eye: No discharge.        Left eye: No discharge.     Extraocular Movements: Extraocular movements intact.     Conjunctiva/sclera: Conjunctivae normal.     Pupils: Pupils are equal, round, and reactive  to light.  Neck:     Musculoskeletal: Normal range of motion and neck supple. No neck rigidity or muscular tenderness.     Vascular: No carotid bruit.  Cardiovascular:     Rate and Rhythm: Normal rate and regular rhythm.     Pulses: Normal pulses.     Heart sounds: Normal heart sounds. No murmur. No friction rub. No gallop.   Pulmonary:     Effort: Pulmonary effort is normal. No respiratory distress.     Breath sounds: Normal breath sounds. No stridor. No wheezing, rhonchi or rales.  Chest:     Chest wall: No tenderness.  Musculoskeletal: Normal range of motion.  Lymphadenopathy:     Cervical:  Cervical adenopathy present.  Skin:    General: Skin is warm and dry.     Capillary Refill: Capillary refill takes less than 2 seconds.     Coloration: Skin is not jaundiced or pale.     Findings: No bruising, erythema, lesion or rash.  Neurological:     General: No focal deficit present.     Mental Status: She is alert and oriented to person, place, and time. Mental status is at baseline.     Cranial Nerves: No cranial nerve deficit.     Sensory: No sensory deficit.     Motor: No weakness.     Coordination: Coordination normal.     Gait: Gait normal.     Deep Tendon Reflexes: Reflexes normal.  Psychiatric:        Mood and Affect: Mood normal.        Behavior: Behavior normal.        Thought Content: Thought content normal.        Judgment: Judgment normal.     Results for orders placed or performed in visit on 06/30/18  CBC with Differential/Platelet  Result Value Ref Range   WBC 5.2 3.4 - 10.8 x10E3/uL   RBC 4.26 3.77 - 5.28 x10E6/uL   Hemoglobin 10.1 (L) 11.1 - 15.9 g/dL   Hematocrit 96.032.5 (L) 45.434.0 - 46.6 %   MCV 76 (L) 79 - 97 fL   MCH 23.7 (L) 26.6 - 33.0 pg   MCHC 31.1 (L) 31.5 - 35.7 g/dL   RDW 09.817.1 (H) 11.911.7 - 14.715.4 %   Platelets 239 150 - 450 x10E3/uL   Neutrophils 59 Not Estab. %   Lymphs 31 Not Estab. %   Monocytes 8 Not Estab. %   Eos 1 Not Estab. %   Basos 1 Not Estab. %   Neutrophils Absolute 3.1 1.4 - 7.0 x10E3/uL   Lymphocytes Absolute 1.6 0.7 - 3.1 x10E3/uL   Monocytes Absolute 0.4 0.1 - 0.9 x10E3/uL   EOS (ABSOLUTE) 0.1 0.0 - 0.4 x10E3/uL   Basophils Absolute 0.0 0.0 - 0.2 x10E3/uL   Immature Granulocytes 0 Not Estab. %   Immature Grans (Abs) 0.0 0.0 - 0.1 x10E3/uL  Comprehensive metabolic panel  Result Value Ref Range   Glucose 78 65 - 99 mg/dL   BUN 13 6 - 20 mg/dL   Creatinine, Ser 8.290.84 0.57 - 1.00 mg/dL   GFR calc non Af Amer 88 >59 mL/min/1.73   GFR calc Af Amer 101 >59 mL/min/1.73   BUN/Creatinine Ratio 15 9 - 23   Sodium 138 134 - 144  mmol/L   Potassium 4.1 3.5 - 5.2 mmol/L   Chloride 101 96 - 106 mmol/L   CO2 21 20 - 29 mmol/L   Calcium 9.1 8.7 - 10.2 mg/dL  Total Protein 6.9 6.0 - 8.5 g/dL   Albumin 4.5 3.5 - 5.5 g/dL   Globulin, Total 2.4 1.5 - 4.5 g/dL   Albumin/Globulin Ratio 1.9 1.2 - 2.2   Bilirubin Total 0.4 0.0 - 1.2 mg/dL   Alkaline Phosphatase 44 39 - 117 IU/L   AST 23 0 - 40 IU/L   ALT 25 0 - 32 IU/L  Lipid Panel w/o Chol/HDL Ratio  Result Value Ref Range   Cholesterol, Total 180 100 - 199 mg/dL   Triglycerides 287 (H) 0 - 149 mg/dL   HDL 50 >86 mg/dL   VLDL Cholesterol Cal 56 (H) 5 - 40 mg/dL   LDL Calculated 74 0 - 99 mg/dL  TSH  Result Value Ref Range   TSH 1.750 0.450 - 4.500 uIU/mL  UA/M w/rflx Culture, Routine  Result Value Ref Range   Specific Gravity, UA 1.010 1.005 - 1.030   pH, UA 6.5 5.0 - 7.5   Color, UA Yellow Yellow   Appearance Ur Cloudy (A) Clear   Leukocytes, UA Negative Negative   Protein, UA Negative Negative/Trace   Glucose, UA Negative Negative   Ketones, UA Negative Negative   RBC, UA Negative Negative   Bilirubin, UA Negative Negative   Urobilinogen, Ur 0.2 0.2 - 1.0 mg/dL   Nitrite, UA Negative Negative      Assessment & Plan:   Problem List Items Addressed This Visit    None    Visit Diagnoses    Upper respiratory tract infection, unspecified type    -  Primary   Flu negative. Will treat symptomatically with tessalon, tussionex and prednisone. Call if not getting better or getting worse.    Body aches       Flu negative.    Relevant Orders   Veritor Flu A/B Waived       Follow up plan: Return if symptoms worsen or fail to improve.

## 2018-07-28 ENCOUNTER — Telehealth: Payer: Self-pay | Admitting: Family Medicine

## 2018-07-28 NOTE — Telephone Encounter (Signed)
Copied from CRM [id number]. Topic: Quick Communication - See Telephone Encounter >> Jul 28, 2018 10:02 AM Jens Som A wrote: CRM for notification. See Telephone encounter for: 07/28/18.  Patient is calling to have her proof that she had her TB test completed.  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 Patient will drop it off today after 2pm.

## 2018-07-29 NOTE — Telephone Encounter (Signed)
Patient had TB lab draw done on 03/26/18, however, we have no results. Called and spoke to [place], they are unsure what happened as lab was marked as drawn. Patient will need to come back for repeat lab.

## 2018-07-29 NOTE — Telephone Encounter (Signed)
Amber [name] left message for patient to return call this morning.

## 2018-07-30 NOTE — Telephone Encounter (Signed)
Spoke with patient, she will not be taking this job, so at this time she does not need anything.

## 2018-08-04 ENCOUNTER — Telehealth: Payer: Self-pay | Admitting: Family Medicine

## 2018-08-04 DIAGNOSIS — Z111 Encounter for screening for respiratory tuberculosis: Secondary | ICD-10-CM

## 2018-08-04 DIAGNOSIS — Z021 Encounter for pre-employment examination: Secondary | ICD-10-CM

## 2018-08-04 NOTE — Telephone Encounter (Signed)
Message relayed to patient. Verbalized understanding and denied questions.   

## 2018-08-04 NOTE — Telephone Encounter (Signed)
Received form from school district. She had her physical, which I can fill out, but needs blood work for TB test, MMR/varicella/hep B immunity. Orders in. She can come get them done whenever she'd like.

## 2018-08-06 ENCOUNTER — Telehealth: Payer: Self-pay

## 2018-08-06 NOTE — Telephone Encounter (Signed)
Patient dropped off Health Examination Certificate for [employer].  Patient needs to come in for a TB and MMR lab draw before paper work can be completed.  Tried calling patient twice. LVM for patient to return call. Okay for PEC to relay information.  Patient can come in anytime from 8:30-4:30 (closed from 12:00-1:00) for lunch-to get her labs.   CRM Created.

## 2018-08-19 NOTE — Telephone Encounter (Signed)
Called patient to let her know her health examination form was ready for pick up.  No answer, VM full.  Routing to [employer] as FYI. Will try again tomorrow. Forms in folder.

## 2018-08-19 NOTE — Telephone Encounter (Signed)
Pt returned call. Will p/u form 2/26 (notified pt office closed 12-1)

## 2018-09-07 ENCOUNTER — Other Ambulatory Visit: Payer: Self-pay | Admitting: Family Medicine

## 2018-09-28 ENCOUNTER — Other Ambulatory Visit: Payer: Self-pay | Admitting: Family Medicine

## 2018-10-03 ENCOUNTER — Other Ambulatory Visit: Payer: Self-pay

## 2018-10-03 ENCOUNTER — Ambulatory Visit: Payer: Self-pay

## 2018-10-03 ENCOUNTER — Ambulatory Visit (INDEPENDENT_AMBULATORY_CARE_PROVIDER_SITE_OTHER): Payer: PRIVATE HEALTH INSURANCE | Admitting: Family Medicine

## 2018-10-03 ENCOUNTER — Encounter: Payer: Self-pay | Admitting: Family Medicine

## 2018-10-03 VITALS — Temp 98.9°F

## 2018-10-03 DIAGNOSIS — R52 Pain, unspecified: Secondary | ICD-10-CM | POA: Diagnosis not present

## 2018-10-03 DIAGNOSIS — S40861A Insect bite (nonvenomous) of right upper arm, initial encounter: Secondary | ICD-10-CM

## 2018-10-03 DIAGNOSIS — W57XXXA Bitten or stung by nonvenomous insect and other nonvenomous arthropods, initial encounter: Secondary | ICD-10-CM

## 2018-10-03 MED ORDER — DOXYCYCLINE HYCLATE 100 MG PO TABS: 100.0000 mg | ORAL_TABLET | Freq: Two times a day (BID) | ORAL | 0 refills | Status: AC

## 2018-10-03 NOTE — Progress Notes (Signed)
Temp 98.9 F (37.2 C) (Oral) Comment: pt reported- virtual visit   Subjective:    Patient ID: Amber [name], female    DOB: Dec 26, 1980, 38 y.o.   MRN: [id number]  HPI: Amber [name] is a 38 y.o. female  Chief Complaint  Patient presents with  . Tick Removal    pt states she was bite by a tick, not exactly when. pulled it out last night but thinks it possibly bit her last weekend. States she has had a headache and the spot sore.   Tick Bite Duration: unclear- found it last night  Location: under R arm in the arm pit area Onset: about a week Itching: no Status: about the same as yesterday when she got it out Treatments attempted: hand sanitizer and washed it Fever: no Chills: yes headache: yes Muscle pain: yes- especially her R arm Rash: no, just a bump where the tick was  Relevant past medical, surgical, family and social history reviewed and updated as indicated. Interim medical history since our last visit reviewed. Allergies and medications reviewed and updated.  Review of Systems  Constitutional: Negative.   Respiratory: Negative.   Cardiovascular: Negative.   Psychiatric/Behavioral: Negative.     Per HPI unless specifically indicated above     Objective:    Temp 98.9 F (37.2 C) (Oral) Comment: pt reported- virtual visit  Wt Readings from Last 3 Encounters:  07/18/18 224 lb (101.6 kg)  06/30/18 226 lb 3.2 oz (102.6 kg)  03/14/18 221 lb (100.2 kg)    Physical Exam Vitals signs and nursing note reviewed.  Constitutional:      General: She is not in acute distress.    Appearance: Normal appearance. She is not ill-appearing, toxic-appearing or diaphoretic.  HENT:     Head: Normocephalic and atraumatic.     Right Ear: External ear normal.     Left Ear: External ear normal.     Nose: Nose normal.     Mouth/Throat:     Mouth: Mucous membranes are moist.     Pharynx: Oropharynx is clear.  Eyes:     General: No scleral icterus.       Right eye: No  discharge.        Left eye: No discharge.     Conjunctiva/sclera: Conjunctivae normal.     Pupils: Pupils are equal, round, and reactive to light.  Neck:     Musculoskeletal: Normal range of motion.  Pulmonary:     Effort: Pulmonary effort is normal. No respiratory distress.     Comments: Speaking in full sentences Musculoskeletal: Normal range of motion.  Skin:    Coloration: Skin is not jaundiced or pale.     Findings: No bruising, erythema, lesion or rash.     Comments: Small red bump under R arm, no rash or swelling  Neurological:     Mental Status: She is alert and oriented to person, place, and time. Mental status is at baseline.  Psychiatric:        Mood and Affect: Mood normal.        Behavior: Behavior normal.        Thought Content: Thought content normal.        Judgment: Judgment normal.     Results for orders placed or performed in visit on 07/18/18  Veritor Flu A/B Waived  Result Value Ref Range   Influenza A Negative Negative   Influenza B Negative Negative      Assessment & Plan:  Problem List Items Addressed This Visit    None    Visit Diagnoses    Body aches    -  Primary   Will treat prophylactically with doxycycline. Call if not getting better or getting worse.    Tick bite of axillary region, right, initial encounter       Seems to be healing well. Warm compresses. Will treat with doxycycline. Call with any concerns. Continue to monitor.        Follow up plan: Return if symptoms worsen or fail to improve.    . This visit was completed via FaceTime due to the restrictions of the COVID-19 pandemic. All issues as above were discussed and addressed. Physical exam was done as above through visual confirmation on FaceTime. If it was felt that the patient should be evaluated in the office, they were directed there. The patient verbally consented to this visit. . Location of the patient: home . Location of the provider: home . Those involved with this  call:  . Provider: Olevia Perches, DO . CMA: Wilhemena Durie, CMA . Front Desk/Registration: Adela Ports  . Time spent on call: 15 minutes with patient face to face via video conference. More than 50% of this time was spent in counseling and coordination of care. 23 minutes total spent in review of patient's record and preparation of their chart.

## 2018-10-03 NOTE — Telephone Encounter (Signed)
    2  Amber [name]. [place] Female, 38 y.o., [birth year] MRN:  [id number] Phone:  [phone] Judie Petit) PCP:  Dorcas Carrow, DO Primary Cvg:  BLUE CROSS BLUE SHIELD/BCBS OTHER Next Appt With Family Medicine 01/09/2019 at 8:30 AM Message from Darron Doom sent at 10/03/2018 9:03 AM EDT   Patient called to say that she is having headaches, said that a tick was latched on under her arm pit said that she pulled it off but the area is very tender. Asking for some advise. Ph# [phone]  Call History    Type Contact Phone  10/03/2018 09:00 AM Phone ([address] Amber, [name] (Self) [phone] (H)  User: Darron Doom

## 2018-10-03 NOTE — Telephone Encounter (Signed)
Please advise 

## 2018-10-03 NOTE — Telephone Encounter (Signed)
Pt called stating that she had a tick she thinks has been on her arm for at least a week.  She describes it as small in size. She states that it bit her under her right arm and she now has a red tender area that looks like a pimple.  She has no rashes but had a headache. Per protocol call transferred to the office for [facility] appointment scheduling by Katherene Ponto. Care advice read to patient. Pt verbalized understanding of all instructions.  Reason for Disposition . [1] 2 to 14 days following tick bite AND [2] widespread rash or headache AND [3] no fever  Answer Assessment - Initial Assessment Questions 1. TYPE of TICK: "Is it a wood tick or a deer tick?" If unsure, ask: "What size was the tick?" "Did it look more like a watermelon seed or a poppy seed?"      small 2. LOCATION: "Where is the tick bite located?"      Right underarm 3. ONSET: "How long do you think the tick was attached before you removed it?" (Hours or days)      Last week 4. TETANUS: "When was the last tetanus booster?"      Yes up to date 5. PREGNANCY: "Is there any chance you are pregnant?" "When was your last menstrual period?"     No current on cycle  Protocols used: TICK BITE-A-AH

## 2018-10-17 ENCOUNTER — Other Ambulatory Visit: Payer: Self-pay | Admitting: Family Medicine

## 2018-10-17 NOTE — Telephone Encounter (Signed)
Please advise 

## 2018-11-15 ENCOUNTER — Telehealth: Payer: BLUE CROSS/BLUE SHIELD | Admitting: Physician Assistant

## 2018-11-15 DIAGNOSIS — R05 Cough: Secondary | ICD-10-CM

## 2018-11-15 DIAGNOSIS — R0781 Pleurodynia: Secondary | ICD-10-CM

## 2018-11-15 DIAGNOSIS — R059 Cough, unspecified: Secondary | ICD-10-CM

## 2018-11-15 NOTE — Progress Notes (Signed)
Based on what you shared with me, I feel your condition warrants further evaluation and I recommend that you be seen for a face to face office visit.  Due to chest discomfort with breathing you need a good lung examination and assessment of vital signs to rule out concerns for pneumonia, etc. I recommend you be seen today for further assessment. See below.    NOTE: If you entered your credit card information for this eVisit, you will not be charged. You may see a "hold" on your card for the $35 but that hold will drop off and you will not have a charge processed.  If you are having a true medical emergency please call 911.  If you need an urgent face to face visit, [facility] has four urgent care centers for your convenience.    PLEASE NOTE: THE [facility] LOCATIONS AND URGENT CARE CLINICS DO NOT HAVE THE TESTING FOR CORONAVIRUS COVID19 AVAILABLE.  IF YOU FEEL YOU NEED THIS TEST YOU MUST GO TO A TRIAGE LOCATION AT ONE OF THE HOSPITAL EMERGENCY DEPARTMENTS   [email or web address] to reserve your spot online an avoid wait times  [facility] [address] [place], [place] [address] Modified hours of operation: Monday-Friday, 12 PM to 6 PM  Saturday & Sunday 10 AM to 4 PM *Across the street from [employer]  [employer] (New Address!) [address], [address] [place], [place] [address] *Just off [employer], across the road from [place]* Modified hours of operation: Monday-Friday, 12 PM to 6 PM  Closed Saturday & Sunday  [facility]'s modified hours of operation will be in effect from May 1 until May 31   The following sites will take your insurance:  . [facility]  [phone] Get Driving Directions Find a Provider at this Location  [address] [place], [place] [address] . 10 am to 8 pm Monday-Friday . 12 pm to 8 pm Saturday-Sunday   . [facility] at [facility]  [phone]  Get Driving Directions Find a Provider at this Location  [address], [address] [place], [place] [address] . 8 am to 8 pm Monday-Friday . 9 am to 6 pm Saturday . 11 am to 6 pm Sunday   . [facility] at [facility]  [phone] Get Driving Directions  [address] [place], [place] [address] . 8 am to 8 pm Monday-Friday . 8 am to 4 pm Saturday-Sunday   Your e-visit answers were reviewed by a board certified advanced clinical practitioner to complete your personal care plan.  Thank you for using e-Visits.

## 2018-12-02 ENCOUNTER — Ambulatory Visit (INDEPENDENT_AMBULATORY_CARE_PROVIDER_SITE_OTHER): Payer: PRIVATE HEALTH INSURANCE | Admitting: Family Medicine

## 2018-12-02 ENCOUNTER — Encounter: Payer: Self-pay | Admitting: Family Medicine

## 2018-12-02 ENCOUNTER — Other Ambulatory Visit: Payer: Self-pay

## 2018-12-02 DIAGNOSIS — J40 Bronchitis, not specified as acute or chronic: Secondary | ICD-10-CM | POA: Diagnosis not present

## 2018-12-02 DIAGNOSIS — K219 Gastro-esophageal reflux disease without esophagitis: Secondary | ICD-10-CM | POA: Insufficient documentation

## 2018-12-02 MED ORDER — PREDNISONE 10 MG PO TABS: ORAL_TABLET | ORAL | 0 refills | Status: DC

## 2018-12-02 NOTE — Progress Notes (Signed)
There were no vitals taken for this visit.   Subjective:    Patient ID: Amber [name], female    DOB: 03/19/1981, 38 y.o.   MRN: [id number]  HPI: Amber [name] is a 38 y.o. female  Chief Complaint  Patient presents with  . Bronchitis    Cough, sore throat, fatigue still present. Wheezing has improved. Completed medication.    Went to [facility] walk in on 5/23 with a 1 week history of cough. Diagnosed with bronchitis and started on doxycycline and 3 day course of prednisone. Today, she notes that she id feeling better. She notes that she still coughing, but wheezing is better. No fever. No SOB. Wheezing is gone. Has very sore throat and laryngitis.  UPPER RESPIRATORY TRACT INFECTION Worst symptom: cough Fever: no Cough: yes Shortness of breath: no Wheezing: no Chest pain: no Chest tightness: no Chest congestion: no Nasal congestion: no Runny nose: no Post nasal drip: no Sneezing: no Sore throat: yes Swollen glands: no Sinus pressure: yes Headache: yes Face pain: no Toothache: no Ear pain: no  Ear pressure: no  Eyes red/itching:no Eye drainage/crusting: no  Vomiting: no Rash: no Fatigue: yes Sick contacts: yes Strep contacts: no  Context: better Recurrent sinusitis: no Relief with OTC cold/cough medications: no  Treatments attempted: antibiotics    Relevant past medical, surgical, family and social history reviewed and updated as indicated. Interim medical history since our last visit reviewed. Allergies and medications reviewed and updated.  Review of Systems  Constitutional: Positive for fatigue. Negative for activity change, appetite change, chills, diaphoresis, fever and unexpected weight change.  HENT: Positive for sinus pressure, sore throat and voice change. Negative for congestion, dental problem, drooling, ear discharge, ear pain, facial swelling, hearing loss, mouth sores, nosebleeds, postnasal drip, rhinorrhea, sinus pain, sneezing, tinnitus and trouble  swallowing.   Eyes: Negative.   Respiratory: Positive for cough. Negative for apnea, choking, chest tightness, shortness of breath, wheezing and stridor.   Cardiovascular: Negative.   Gastrointestinal: Negative.   Psychiatric/Behavioral: Negative.     Per HPI unless specifically indicated above     Objective:    There were no vitals taken for this visit.  Wt Readings from Last 3 Encounters:  07/18/18 224 lb (101.6 kg)  06/30/18 226 lb 3.2 oz (102.6 kg)  03/14/18 221 lb (100.2 kg)    Physical Exam Vitals signs and nursing note reviewed.  Constitutional:      General: She is not in acute distress.    Appearance: Normal appearance. She is obese. She is not ill-appearing, toxic-appearing or diaphoretic.  HENT:     Head: Normocephalic and atraumatic.     Right Ear: External ear normal.     Left Ear: External ear normal.     Nose: Congestion and rhinorrhea present.     Mouth/Throat:     Mouth: Mucous membranes are moist.     Pharynx: Oropharynx is clear. No oropharyngeal exudate or posterior oropharyngeal erythema.     Comments: Audible laryngitis  Eyes:     General: No scleral icterus.       Right eye: No discharge.        Left eye: No discharge.     Conjunctiva/sclera: Conjunctivae normal.     Pupils: Pupils are equal, round, and reactive to light.  Neck:     Musculoskeletal: Normal range of motion.  Pulmonary:     Effort: Pulmonary effort is normal. No respiratory distress.     Comments: Speaking in full  sentences Musculoskeletal: Normal range of motion.  Skin:    Coloration: Skin is not jaundiced or pale.     Findings: No bruising, erythema, lesion or rash.  Neurological:     Mental Status: She is alert and oriented to person, place, and time. Mental status is at baseline.  Psychiatric:        Mood and Affect: Mood normal.        Behavior: Behavior normal.        Thought Content: Thought content normal.        Judgment: Judgment normal.     Results for orders  placed or performed in visit on 07/18/18  Veritor Flu A/B Waived  Result Value Ref Range   Influenza A Negative Negative   Influenza B Negative Negative      Assessment & Plan:   Problem List Items Addressed This Visit    None    Visit Diagnoses    Bronchitis    -  Primary   Will treat with 12 day prednisone taper. Recheck 2 weeks. Call if not getting better or getting worse. Continue to monitor.       Follow up plan: Return 2 weeks, for follow up breathing.    . This visit was completed via FaceTime due to the restrictions of the COVID-19 pandemic. All issues as above were discussed and addressed. Physical exam was done as above through visual confirmation on FaceTime. If it was felt that the patient should be evaluated in the office, they were directed there. The patient verbally consented to this visit. . Location of the patient: work . Location of the provider: work . Those involved with this call:  . Provider: Olevia PerchesMegan Johnson, DO . CMA: Elton SinAnita Quito, CMA . Front Desk/Registration: Adela Portshristan Williamson  . Time spent on call: 25 minutes with patient face to face via video conference. More than 50% of this time was spent in counseling and coordination of care. 40 minutes total spent in review of patient's record and preparation of their chart.

## 2019-01-09 ENCOUNTER — Ambulatory Visit: Payer: PRIVATE HEALTH INSURANCE | Admitting: Family Medicine

## 2019-01-16 ENCOUNTER — Other Ambulatory Visit: Payer: Self-pay | Admitting: Family Medicine

## 2019-01-29 ENCOUNTER — Ambulatory Visit: Payer: PRIVATE HEALTH INSURANCE | Admitting: Nurse Practitioner

## 2019-03-18 ENCOUNTER — Other Ambulatory Visit: Payer: Self-pay | Admitting: Family Medicine

## 2019-03-18 NOTE — Telephone Encounter (Signed)
Approved per protocol. Requested Prescriptions  Pending Prescriptions Disp Refills  . traZODone (DESYREL) 50 MG tablet [Pharmacy Med Name: TRAZODONE 50 MG TABLET] 90 tablet 0    Sig: TAKE 0.5-1 TABLETS (25-50 MG TOTAL) BY MOUTH AT BEDTIME AS NEEDED FOR SLEEP.     Psychiatry: Antidepressants - Serotonin Modulator Passed - 03/18/2019  9:31 AM      Passed - Valid encounter within last 6 months    Recent Outpatient Visits          3 months ago [place], Megan P, DO   5 months ago Body aches   [place], Megan P, DO   8 months ago Upper respiratory tract infection, unspecified type   [place], Megan P, DO   8 months ago Routine general medical examination at a health care facility   [facility], [place] P, DO   1 year ago Acute maxillary sinusitis, recurrence not specified   [place], [place], [place]             Passed - Completed PHQ-2 or PHQ-9 in the last 360 days.

## 2019-05-13 ENCOUNTER — Other Ambulatory Visit: Payer: Self-pay

## 2019-05-13 DIAGNOSIS — Z20822 Contact with and (suspected) exposure to covid-19: Secondary | ICD-10-CM

## 2019-05-15 LAB — NOVEL CORONAVIRUS, NAA: SARS-CoV-2, NAA: NOT DETECTED

## 2019-06-07 ENCOUNTER — Other Ambulatory Visit: Payer: Self-pay | Admitting: Family Medicine

## 2019-06-08 ENCOUNTER — Encounter: Payer: Self-pay | Admitting: Family Medicine

## 2019-06-08 NOTE — Telephone Encounter (Signed)
Needs follow up on her anxiety

## 2019-06-08 NOTE — Telephone Encounter (Signed)
Called pt, no answer, left vm, sending letter.

## 2019-06-08 NOTE — Telephone Encounter (Signed)
Requested medication (s) are due for refill today: yes  Requested medication (s) are on the active medication list: yes  Last refill:  04/09/2019  Future visit scheduled: no  Notes to clinic:  LOV-12/02/2018 Review for refill   Requested Prescriptions  Pending Prescriptions Disp Refills   busPIRone (BUSPAR) 5 MG tablet [Pharmacy Med Name: BUSPIRONE HCL 5 MG TABLET] 180 tablet 2    Sig: TAKE 1-3 TABLETS (5-15 MG TOTAL) BY MOUTH 3 (THREE) TIMES DAILY.      Psychiatry: Anxiolytics/Hypnotics - Non-controlled Failed - 06/07/2019 10:16 AM      Failed - Valid encounter within last 6 months    Recent Outpatient Visits           6 months ago [place], Megan P, DO   8 months ago Body aches   [place], Megan P, DO   10 months ago Upper respiratory tract infection, unspecified type   [facility], Megan P, DO   11 months ago Routine general medical examination at a health care facility   [facility], [place] P, DO   1 year ago Acute maxillary sinusitis, recurrence not specified   [facility] [place], [place]

## 2019-07-23 ENCOUNTER — Encounter: Payer: Self-pay | Admitting: Family Medicine

## 2019-07-23 ENCOUNTER — Ambulatory Visit: Payer: PRIVATE HEALTH INSURANCE | Admitting: Family Medicine

## 2019-07-23 ENCOUNTER — Other Ambulatory Visit: Payer: Self-pay

## 2019-07-23 VITALS — BP 125/63 | HR 84 | Temp 98.7°F | Ht 61.5 in | Wt 228.0 lb

## 2019-07-23 DIAGNOSIS — F419 Anxiety disorder, unspecified: Secondary | ICD-10-CM

## 2019-07-23 DIAGNOSIS — Z23 Encounter for immunization: Secondary | ICD-10-CM | POA: Diagnosis not present

## 2019-07-23 DIAGNOSIS — F329 Major depressive disorder, single episode, unspecified: Secondary | ICD-10-CM

## 2019-07-23 DIAGNOSIS — N926 Irregular menstruation, unspecified: Secondary | ICD-10-CM

## 2019-07-23 DIAGNOSIS — K219 Gastro-esophageal reflux disease without esophagitis: Secondary | ICD-10-CM

## 2019-07-23 DIAGNOSIS — Z87891 Personal history of nicotine dependence: Secondary | ICD-10-CM

## 2019-07-23 DIAGNOSIS — Z13220 Encounter for screening for lipoid disorders: Secondary | ICD-10-CM | POA: Diagnosis not present

## 2019-07-23 DIAGNOSIS — R8281 Pyuria: Secondary | ICD-10-CM

## 2019-07-23 MED ORDER — QUETIAPINE FUMARATE 25 MG PO TABS: 25.0000 mg | ORAL_TABLET | Freq: Every day | ORAL | 3 refills | Status: DC

## 2019-07-23 NOTE — Progress Notes (Signed)
BP 125/63   Pulse 84   Temp 98.7 F (37.1 C) (Oral)   Ht 5' 1.5" (1.562 m)   Wt 228 lb (103.4 kg)   SpO2 98%   BMI 42.38 kg/m    Subjective:    Patient ID: Amber [name], female    DOB: 14-Aug-1980, 39 y.o.   MRN: [id number]  HPI: Amber [name] is a 39 y.o. female  Chief Complaint  Patient presents with  . Depression    pt would like to switch her antidepressant for something else   DEPRESSION- stopped her lexapro over a month ago because she didn't feel like it was helping. She notes that she's not sleeping and feels irritable all the time Mood status: exacerbated Satisfied with current treatment?: no Symptom severity: moderate  Duration of current treatment : chronic Side effects: no Medication compliance: poor compliance Psychotherapy/counseling: no  Previous psychiatric medications: fluoxetine, lexapro, wellbutrin. Trazodone, zoloft Depressed mood: yes Anxious mood: yes Anhedonia: no Significant weight loss or gain: no Insomnia: yes hard to fall asleep Fatigue: yes Feelings of worthlessness or guilt: yes Impaired concentration/indecisiveness: yes Suicidal ideations: no Hopelessness: no Crying spells: no Depression screen [facility] 2/9 07/23/2019 06/30/2018 02/25/2018 12/17/2017 11/14/2017  Decreased Interest 2 1 1 1 3   Down, Depressed, Hopeless 2 1 1 2 2   PHQ - 2 Score 4 2 2 3 5   Altered sleeping 3 1 2 1 3   Tired, decreased energy 2 1 2 1 2   Change in appetite 1 2 1 3 3   Feeling bad or failure about yourself  2 2 2 2 2   Trouble concentrating 1 1 0 1 2  Moving slowly or fidgety/restless 1 0 0 1 0  Suicidal thoughts 1 1 0 1 0  PHQ-9 Score 15 10 9 13 17   Difficult doing work/chores Very difficult Somewhat difficult Very difficult Very difficult Very difficult  Some recent data might be hidden   GAD 7 : Generalized Anxiety Score 07/23/2019 06/30/2018 02/25/2018 12/17/2017  Nervous, Anxious, on Edge 3 1 1 1   Control/stop worrying 2 2 1 2   Worry too much - different things 2 2  1 2   Trouble relaxing 2 1 1 1   Restless 1 1 - 1  Easily annoyed or irritable 3 1 2 2   Afraid - awful might happen 2 1 - 2  Total GAD 7 Score 15 9 - 11  Anxiety Difficulty Somewhat difficult Somewhat difficult Very difficult Somewhat difficult    ABNORMAL MENSTRUAL PERIODS G2P2002 Duration: weeks Average interval between menses: 28-31 days- now shorter Length of menses: last couple- 3 weeks Flow: heavy Dysmenorrhea: yes Intermenstrual bleeding:no Postcoital bleeding: no History of sexually transmitted diseases: no History GYN procedures: no Abnormal pap smears: no   Dyspareunia: no Vaginal discharge:no Abdominal pain: no Galactorrhea: no Hirsuitism: no Frequent bruising/mucosal bleeding: no Double vision:no Hot flashes: no  GERD- has been taking prilosec PRN GERD control status: exacerbated  Satisfied with current treatment? no Heartburn frequency: nearly daily Medication side effects: no  Medication compliance: fair Dysphagia: no Odynophagia:  no Hematemesis: no Blood in stool: no EGD: no  Relevant past medical, surgical, family and social history reviewed and updated as indicated. Interim medical history since our last visit reviewed. Allergies and medications reviewed and updated.  Review of Systems  Constitutional: Negative.   Respiratory: Negative.   Cardiovascular: Negative.   Genitourinary: Positive for menstrual problem, pelvic pain and vaginal bleeding. Negative for decreased urine volume, difficulty urinating, dyspareunia, dysuria, enuresis, flank pain,  frequency, genital sores, hematuria, urgency, vaginal discharge and vaginal pain.  Musculoskeletal: Negative.   Skin: Negative.   Neurological: Negative.   Psychiatric/Behavioral: Positive for agitation, decreased concentration, dysphoric mood and sleep disturbance. Negative for behavioral problems, confusion, hallucinations, self-injury and suicidal ideas. The patient is nervous/anxious. The patient is  not hyperactive.     Per HPI unless specifically indicated above     Objective:    BP 125/63   Pulse 84   Temp 98.7 F (37.1 C) (Oral)   Ht 5' 1.5" (1.562 m)   Wt 228 lb (103.4 kg)   SpO2 98%   BMI 42.38 kg/m   Wt Readings from Last 3 Encounters:  07/23/19 228 lb (103.4 kg)  07/18/18 224 lb (101.6 kg)  06/30/18 226 lb 3.2 oz (102.6 kg)    Physical Exam Vitals and nursing note reviewed.  Constitutional:      General: She is not in acute distress.    Appearance: Normal appearance. She is not ill-appearing, toxic-appearing or diaphoretic.  HENT:     Head: Normocephalic and atraumatic.     Right Ear: External ear normal.     Left Ear: External ear normal.     Nose: Nose normal.     Mouth/Throat:     Mouth: Mucous membranes are moist.     Pharynx: Oropharynx is clear.  Eyes:     General: No scleral icterus.       Right eye: No discharge.        Left eye: No discharge.     Extraocular Movements: Extraocular movements intact.     Conjunctiva/sclera: Conjunctivae normal.     Pupils: Pupils are equal, round, and reactive to light.  Cardiovascular:     Rate and Rhythm: Normal rate and regular rhythm.     Pulses: Normal pulses.     Heart sounds: Normal heart sounds. No murmur. No friction rub. No gallop.   Pulmonary:     Effort: Pulmonary effort is normal. No respiratory distress.     Breath sounds: Normal breath sounds. No stridor. No wheezing, rhonchi or rales.  Chest:     Chest wall: No tenderness.  Musculoskeletal:        General: Normal range of motion.     Cervical back: Normal range of motion and neck supple.  Skin:    General: Skin is warm and dry.     Capillary Refill: Capillary refill takes less than 2 seconds.     Coloration: Skin is not jaundiced or pale.     Findings: No bruising, erythema, lesion or rash.  Neurological:     General: No focal deficit present.     Mental Status: She is alert and oriented to person, place, and time. Mental status is at  baseline.  Psychiatric:        Mood and Affect: Mood normal.        Behavior: Behavior normal.        Thought Content: Thought content normal.        Judgment: Judgment normal.     Results for orders placed or performed in visit on 07/23/19  Microscopic Examination   URINE  Result Value Ref Range   WBC, UA None seen 0 - 5 /hpf   RBC 3-10 (A) 0 - 2 /hpf   Epithelial Cells (non renal) 0-10 0 - 10 /hpf   Bacteria, UA None seen None seen/Few  Comprehensive metabolic panel  Result Value Ref Range   Glucose 92 65 - 99  mg/dL   BUN 13 6 - 24 mg/dL   Creatinine, Ser 6.64 0.57 - 1.00 mg/dL   GFR calc non Af Amer 72 >59 mL/min/1.73   GFR calc Af Amer 82 >59 mL/min/1.73   BUN/Creatinine Ratio 13 9 - 23   Sodium 138 134 - 144 mmol/L   Potassium 4.1 3.5 - 5.2 mmol/L   Chloride 99 96 - 106 mmol/L   CO2 23 20 - 29 mmol/L   Calcium 9.7 8.7 - 10.2 mg/dL   Total Protein 7.4 6.0 - 8.5 g/dL   Albumin 5.0 (H) 3.8 - 4.8 g/dL   Globulin, Total 2.4 1.5 - 4.5 g/dL   Albumin/Globulin Ratio 2.1 1.2 - 2.2   Bilirubin Total 0.4 0.0 - 1.2 mg/dL   Alkaline Phosphatase 59 39 - 117 IU/L   AST 25 0 - 40 IU/L   ALT 41 (H) 0 - 32 IU/L  Lipid Panel w/o Chol/HDL Ratio  Result Value Ref Range   Cholesterol, Total 169 100 - 199 mg/dL   Triglycerides 403 (H) 0 - 149 mg/dL   HDL 49 >47 mg/dL   VLDL Cholesterol Cal 48 (H) 5 - 40 mg/dL   LDL Chol Calc (NIH) 72 0 - 99 mg/dL  TSH  Result Value Ref Range   TSH 1.090 0.450 - 4.500 uIU/mL  UA/M w/rflx Culture, Routine   Specimen: Urine   URINE  Result Value Ref Range   Specific Gravity, UA 1.015 1.005 - 1.030   pH, UA 5.5 5.0 - 7.5   Color, UA Yellow Yellow   Appearance Ur Clear Clear   Leukocytes,UA Negative Negative   Protein,UA Negative Negative/Trace   Glucose, UA Negative Negative   Ketones, UA Negative Negative   RBC, UA 3+ (A) Negative   Bilirubin, UA Negative Negative   Urobilinogen, Ur 0.2 0.2 - 1.0 mg/dL   Nitrite, UA Negative Negative    Microscopic Examination See below:   VITAMIN D 25 Hydroxy (Vit-D Deficiency, Fractures)  Result Value Ref Range   Vit D, 25-Hydroxy 16.3 (L) 30.0 - 100.0 ng/mL  Bayer DCA Hb A1c Waived  Result Value Ref Range   HB A1C (BAYER DCA - WAIVED) 5.0 <7.0 %  LH  Result Value Ref Range   LH 4.9 mIU/mL  FSH  Result Value Ref Range   FSH 8.3 mIU/mL  Estradiol  Result Value Ref Range   Estradiol 29.8 pg/mL  Testosterone, free, total([facility])  Result Value Ref Range   Testosterone 12 8 - 48 ng/dL   Testosterone, Free WILL FOLLOW    Sex Hormone Binding 74.4 24.6 - 122.0 nmol/L  Prolactin  Result Value Ref Range   Prolactin 15.9 4.8 - 23.3 ng/mL      Assessment & Plan:   Problem List Items Addressed This Visit      Digestive   Gastroesophageal reflux disease    Not under good control. Will take omeprazole daily and recheck 2 weeks. Call with any concerns.       Relevant Medications   Omeprazole (PRILOSEC PO)   Other Relevant Orders   CBC with Differential/Platelet   Comprehensive metabolic panel (Completed)     Other   Morbid (severe) obesity due to excess calories (HCC)    Will work on diet and exercise with goal of losing 1-2lbs per week. Call with nay concerns. Continue to monitor.       Relevant Orders   TSH (Completed)   Bayer DCA Hb A1c Waived (Completed)   Anxiety and depression -  Primary    Will change her to seroquel and recheck 2 weeks. Call with any concerns. Continue to monitor.       Relevant Orders   Comprehensive metabolic panel (Completed)   VITAMIN D 25 Hydroxy (Vit-D Deficiency, Fractures) (Completed)    Other Visit Diagnoses    Screening for cholesterol level       Labs drawn today. Await results.    Relevant Orders   Lipid Panel w/o Chol/HDL Ratio (Completed)   History of smoking       Labs drawn today. Await results.    Relevant Orders   UA/M w/rflx Culture, Routine (Completed)   Abnormal menses       Labs drawn today. Await results.      Relevant Orders   LH (Completed)   FSH (Completed)   Estradiol (Completed)   Testosterone, free, total([facility]) (Completed)   Prolactin (Completed)   Flu vaccine need       Flu shot given today.   Relevant Orders   Flu Vaccine QUAD 36+ mos IM (Completed)       Follow up plan: Return in about 2 weeks (around 08/06/2019) for physical.

## 2019-07-24 ENCOUNTER — Other Ambulatory Visit: Payer: Self-pay | Admitting: Family Medicine

## 2019-07-24 LAB — UA/M W/RFLX CULTURE, ROUTINE
Bilirubin, UA: NEGATIVE
Glucose, UA: NEGATIVE
Ketones, UA: NEGATIVE
Leukocytes,UA: NEGATIVE
Nitrite, UA: NEGATIVE
Protein,UA: NEGATIVE
Specific Gravity, UA: 1.015 (ref 1.005–1.030)
Urobilinogen, Ur: 0.2 mg/dL (ref 0.2–1.0)
pH, UA: 5.5 (ref 5.0–7.5)

## 2019-07-24 LAB — MICROSCOPIC EXAMINATION
Bacteria, UA: NONE SEEN
WBC, UA: NONE SEEN /hpf (ref 0–5)

## 2019-07-24 LAB — BAYER DCA HB A1C WAIVED: HB A1C (BAYER DCA - WAIVED): 5 % (ref <7.0)

## 2019-07-24 MED ORDER — VITAMIN D (ERGOCALCIFEROL) 1.25 MG (50000 UNIT) PO CAPS: 50000.0000 [IU] | ORAL_CAPSULE | ORAL | 1 refills | Status: DC

## 2019-07-25 NOTE — Assessment & Plan Note (Signed)
Will work on diet and exercise with goal of losing 1-2lbs per week. Call with nay concerns. Continue to monitor.

## 2019-07-25 NOTE — Assessment & Plan Note (Signed)
Not under good control. Will take omeprazole daily and recheck 2 weeks. Call with any concerns.

## 2019-07-25 NOTE — Assessment & Plan Note (Signed)
Will change her to seroquel and recheck 2 weeks. Call with any concerns. Continue to monitor.

## 2019-07-26 LAB — ESTRADIOL: Estradiol: 29.8 pg/mL

## 2019-07-26 LAB — FOLLICLE STIMULATING HORMONE: FSH: 8.3 m[IU]/mL

## 2019-07-26 LAB — COMPREHENSIVE METABOLIC PANEL
ALT: 41 IU/L — ABNORMAL HIGH (ref 0–32)
AST: 25 IU/L (ref 0–40)
Albumin/Globulin Ratio: 2.1 (ref 1.2–2.2)
Albumin: 5 g/dL — ABNORMAL HIGH (ref 3.8–4.8)
Alkaline Phosphatase: 59 IU/L (ref 39–117)
BUN/Creatinine Ratio: 13 (ref 9–23)
BUN: 13 mg/dL (ref 6–24)
Bilirubin Total: 0.4 mg/dL (ref 0.0–1.2)
CO2: 23 mmol/L (ref 20–29)
Calcium: 9.7 mg/dL (ref 8.7–10.2)
Chloride: 99 mmol/L (ref 96–106)
Creatinine, Ser: 0.99 mg/dL (ref 0.57–1.00)
GFR calc Af Amer: 82 mL/min/{1.73_m2} (ref >59)
GFR calc non Af Amer: 72 mL/min/{1.73_m2} (ref >59)
Globulin, Total: 2.4 g/dL (ref 1.5–4.5)
Glucose: 92 mg/dL (ref 65–99)
Potassium: 4.1 mmol/L (ref 3.5–5.2)
Sodium: 138 mmol/L (ref 134–144)
Total Protein: 7.4 g/dL (ref 6.0–8.5)

## 2019-07-26 LAB — TESTOSTERONE, FREE, TOTAL, SHBG
Sex Hormone Binding: 74.4 nmol/L (ref 24.6–122.0)
Testosterone, Free: 0.6 pg/mL (ref 0.0–4.2)
Testosterone: 12 ng/dL (ref 8–48)

## 2019-07-26 LAB — PROLACTIN: Prolactin: 15.9 ng/mL (ref 4.8–23.3)

## 2019-07-26 LAB — LIPID PANEL W/O CHOL/HDL RATIO
Cholesterol, Total: 169 mg/dL (ref 100–199)
HDL: 49 mg/dL (ref >39)
LDL Chol Calc (NIH): 72 mg/dL (ref 0–99)
Triglycerides: 302 mg/dL — ABNORMAL HIGH (ref 0–149)
VLDL Cholesterol Cal: 48 mg/dL — ABNORMAL HIGH (ref 5–40)

## 2019-07-26 LAB — VITAMIN D 25 HYDROXY (VIT D DEFICIENCY, FRACTURES): Vit D, 25-Hydroxy: 16.3 ng/mL — ABNORMAL LOW (ref 30.0–100.0)

## 2019-07-26 LAB — TSH: TSH: 1.09 u[IU]/mL (ref 0.450–4.500)

## 2019-07-26 LAB — LUTEINIZING HORMONE: LH: 4.9 m[IU]/mL

## 2019-08-07 ENCOUNTER — Encounter: Payer: Self-pay | Admitting: Family Medicine

## 2019-08-07 ENCOUNTER — Other Ambulatory Visit: Payer: Self-pay

## 2019-08-07 ENCOUNTER — Ambulatory Visit: Payer: PRIVATE HEALTH INSURANCE | Admitting: Family Medicine

## 2019-08-07 VITALS — BP 135/83 | HR 81 | Temp 99.0°F

## 2019-08-07 DIAGNOSIS — J069 Acute upper respiratory infection, unspecified: Secondary | ICD-10-CM

## 2019-08-07 DIAGNOSIS — F419 Anxiety disorder, unspecified: Secondary | ICD-10-CM | POA: Diagnosis not present

## 2019-08-07 DIAGNOSIS — F329 Major depressive disorder, single episode, unspecified: Secondary | ICD-10-CM | POA: Diagnosis not present

## 2019-08-07 MED ORDER — PREDNISONE 10 MG PO TABS: 10.0000 mg | ORAL_TABLET | Freq: Every day | ORAL | 0 refills | Status: DC

## 2019-08-07 MED ORDER — AZITHROMYCIN 250 MG PO TABS: ORAL_TABLET | ORAL | 0 refills | Status: DC

## 2019-08-07 MED ORDER — QUETIAPINE FUMARATE ER 50 MG PO TB24: 50.0000 mg | ORAL_TABLET | Freq: Every day | ORAL | 3 refills | Status: DC

## 2019-08-07 NOTE — Progress Notes (Signed)
BP 135/83 (BP Location: Right Arm, Patient Position: Sitting, Cuff Size: Normal)   Pulse 81   Temp 99 F (37.2 C) (Oral)   SpO2 96%    Subjective:    Patient ID: Amber [name], female    DOB: Aug 03, 1980, 39 y.o.   MRN: [id number]  HPI: [name] is a 39 y.o. female  Chief Complaint  Patient presents with  . 2 Week Follow-up    medication   UPPER RESPIRATORY TRACT INFECTION Duration: about a week Worst symptom: cough  Fever: no- 99 Cough: yes Shortness of breath: yes Wheezing: no Chest pain: no Chest tightness: no Chest congestion: yes Nasal congestion: yes Runny nose: yes Post nasal drip: no Sneezing: yes Sore throat: yes Swollen glands: no Sinus pressure: yes Headache: yes Face pain: yes Toothache: yes Ear pain: no  Ear pressure: no  Eyes red/itching:no Eye drainage/crusting: no  Vomiting: no Rash: no Fatigue: yes Sick contacts: yes Strep contacts: no  Context: stable Recurrent sinusitis: no Relief with OTC cold/cough medications: no  Treatments attempted: cold/sinus, mucinex and cough syrup   ANXIETY/STRESS Duration:uncontrolled Anxious mood: yes  Excessive worrying: yes Irritability: yes  Sweating: no Nausea: no Palpitations:no Hyperventilation: no Panic attacks: no Agoraphobia: no  Obscessions/compulsions: no Depressed mood: yes Depression screen [facility] 2/9 08/10/2019 07/23/2019 06/30/2018 02/25/2018 12/17/2017  Decreased Interest 2 2 1 1 1   Down, Depressed, Hopeless 2 2 1 1 2   PHQ - 2 Score 4 4 2 2 3   Altered sleeping 2 3 1 2 1   Tired, decreased energy 2 2 1 2 1   Change in appetite 3 1 2 1 3   Feeling bad or failure about yourself  3 2 2 2 2   Trouble concentrating 2 1 1  0 1  Moving slowly or fidgety/restless 0 1 0 0 1  Suicidal thoughts 1 1 1  0 1  PHQ-9 Score 17 15 10 9 13   Difficult doing work/chores Somewhat difficult Very difficult Somewhat difficult Very difficult Very difficult  Some recent data might be hidden   GAD 7 : Generalized  Anxiety Score 08/10/2019 07/23/2019 06/30/2018 02/25/2018  Nervous, Anxious, on Edge 2 3 1 1   Control/stop worrying 2 2 2 1   Worry too much - different things 3 2 2 1   Trouble relaxing 2 2 1 1   Restless 0 1 1 -  Easily annoyed or irritable 3 3 1 2   Afraid - awful might happen 2 2 1  -  Total GAD 7 Score 14 15 9  -  Anxiety Difficulty Somewhat difficult Somewhat difficult Somewhat difficult Very difficult   Anhedonia: no Weight changes: no Insomnia: yes hard to fall asleep  Hypersomnia: no Fatigue/loss of energy: yes Feelings of worthlessness: yes Feelings of guilt: yes Impaired concentration/indecisiveness: no Suicidal ideations: no  Crying spells: no Recent Stressors/Life Changes: yes   Relationship problems: yes   Family stress: yes     Financial stress: yes    Job stress: yes    Recent death/loss: no   Relevant past medical, surgical, family and social history reviewed and updated as indicated. Interim medical history since our last visit reviewed. Allergies and medications reviewed and updated.  Review of Systems  Constitutional: Positive for fatigue. Negative for activity change, appetite change, chills, diaphoresis, fever and unexpected weight change.  HENT: Positive for congestion, postnasal drip, rhinorrhea and sinus pressure. Negative for dental problem, drooling, ear discharge, ear pain, facial swelling, hearing loss, mouth sores, nosebleeds, sinus pain, sneezing, sore throat, tinnitus, trouble swallowing and voice  change.   Respiratory: Positive for cough, shortness of breath and wheezing. Negative for apnea, choking, chest tightness and stridor.   Cardiovascular: Negative.   Musculoskeletal: Negative.   Skin: Negative.   Psychiatric/Behavioral: Positive for dysphoric mood. Negative for agitation, behavioral problems, confusion, decreased concentration, hallucinations, self-injury, sleep disturbance and suicidal ideas. The patient is nervous/anxious. The patient is not  hyperactive.     Per HPI unless specifically indicated above     Objective:    BP 135/83 (BP Location: Right Arm, Patient Position: Sitting, Cuff Size: Normal)   Pulse 81   Temp 99 F (37.2 C) (Oral)   SpO2 96%   Wt Readings from Last 3 Encounters:  07/23/19 228 lb (103.4 kg)  07/18/18 224 lb (101.6 kg)  06/30/18 226 lb 3.2 oz (102.6 kg)    Physical Exam Vitals and nursing note reviewed.  Constitutional:      General: She is not in acute distress.    Appearance: Normal appearance. She is not ill-appearing, toxic-appearing or diaphoretic.  HENT:     Head: Normocephalic and atraumatic.     Right Ear: Tympanic membrane, ear canal and external ear normal. There is no impacted cerumen.     Left Ear: Tympanic membrane, ear canal and external ear normal. There is no impacted cerumen.     Nose: Congestion and rhinorrhea present.     Mouth/Throat:     Mouth: Mucous membranes are moist.     Pharynx: Oropharynx is clear. No oropharyngeal exudate or posterior oropharyngeal erythema.  Eyes:     General: No scleral icterus.       Right eye: No discharge.        Left eye: No discharge.     Extraocular Movements: Extraocular movements intact.     Conjunctiva/sclera: Conjunctivae normal.     Pupils: Pupils are equal, round, and reactive to light.  Neck:     Vascular: No carotid bruit.  Cardiovascular:     Rate and Rhythm: Normal rate and regular rhythm.     Pulses: Normal pulses.     Heart sounds: Normal heart sounds. No murmur. No friction rub. No gallop.   Pulmonary:     Effort: Pulmonary effort is normal. No respiratory distress.     Breath sounds: Normal breath sounds. No stridor. No wheezing, rhonchi or rales.  Chest:     Chest wall: No tenderness.  Musculoskeletal:        General: Normal range of motion.     Cervical back: Normal range of motion and neck supple. No rigidity. No muscular tenderness.  Lymphadenopathy:     Cervical: Cervical adenopathy present.  Skin:     General: Skin is warm and dry.     Capillary Refill: Capillary refill takes less than 2 seconds.     Coloration: Skin is not jaundiced or pale.     Findings: No bruising, erythema, lesion or rash.  Neurological:     General: No focal deficit present.     Mental Status: She is alert and oriented to person, place, and time. Mental status is at baseline.     Cranial Nerves: No cranial nerve deficit.     Sensory: No sensory deficit.     Motor: No weakness.     Coordination: Coordination normal.     Gait: Gait normal.     Deep Tendon Reflexes: Reflexes normal.  Psychiatric:        Mood and Affect: Mood normal.        Behavior:  Behavior normal.        Thought Content: Thought content normal.        Judgment: Judgment normal.     Results for orders placed or performed in visit on 07/23/19  Microscopic Examination   URINE  Result Value Ref Range   WBC, UA None seen 0 - 5 /hpf   RBC 3-10 (A) 0 - 2 /hpf   Epithelial Cells (non renal) 0-10 0 - 10 /hpf   Bacteria, UA None seen None seen/Few  Comprehensive metabolic panel  Result Value Ref Range   Glucose 92 65 - 99 mg/dL   BUN 13 6 - 24 mg/dL   Creatinine, Ser 6.26 0.57 - 1.00 mg/dL   GFR calc non Af Amer 72 >59 mL/min/1.73   GFR calc Af Amer 82 >59 mL/min/1.73   BUN/Creatinine Ratio 13 9 - 23   Sodium 138 134 - 144 mmol/L   Potassium 4.1 3.5 - 5.2 mmol/L   Chloride 99 96 - 106 mmol/L   CO2 23 20 - 29 mmol/L   Calcium 9.7 8.7 - 10.2 mg/dL   Total Protein 7.4 6.0 - 8.5 g/dL   Albumin 5.0 (H) 3.8 - 4.8 g/dL   Globulin, Total 2.4 1.5 - 4.5 g/dL   Albumin/Globulin Ratio 2.1 1.2 - 2.2   Bilirubin Total 0.4 0.0 - 1.2 mg/dL   Alkaline Phosphatase 59 39 - 117 IU/L   AST 25 0 - 40 IU/L   ALT 41 (H) 0 - 32 IU/L  Lipid Panel w/o Chol/HDL Ratio  Result Value Ref Range   Cholesterol, Total 169 100 - 199 mg/dL   Triglycerides 948 (H) 0 - 149 mg/dL   HDL 49 >54 mg/dL   VLDL Cholesterol Cal 48 (H) 5 - 40 mg/dL   LDL Chol Calc (NIH) 72 0 -  99 mg/dL  TSH  Result Value Ref Range   TSH 1.090 0.450 - 4.500 uIU/mL  UA/M w/rflx Culture, Routine   Specimen: Urine   URINE  Result Value Ref Range   Specific Gravity, UA 1.015 1.005 - 1.030   pH, UA 5.5 5.0 - 7.5   Color, UA Yellow Yellow   Appearance Ur Clear Clear   Leukocytes,UA Negative Negative   Protein,UA Negative Negative/Trace   Glucose, UA Negative Negative   Ketones, UA Negative Negative   RBC, UA 3+ (A) Negative   Bilirubin, UA Negative Negative   Urobilinogen, Ur 0.2 0.2 - 1.0 mg/dL   Nitrite, UA Negative Negative   Microscopic Examination See below:   VITAMIN D 25 Hydroxy (Vit-D Deficiency, Fractures)  Result Value Ref Range   Vit D, 25-Hydroxy 16.3 (L) 30.0 - 100.0 ng/mL  Bayer DCA Hb A1c Waived  Result Value Ref Range   HB A1C (BAYER DCA - WAIVED) 5.0 <7.0 %  LH  Result Value Ref Range   LH 4.9 mIU/mL  FSH  Result Value Ref Range   FSH 8.3 mIU/mL  Estradiol  Result Value Ref Range   Estradiol 29.8 pg/mL  Testosterone, free, total([facility])  Result Value Ref Range   Testosterone 12 8 - 48 ng/dL   Testosterone, Free 0.6 0.0 - 4.2 pg/mL   Sex Hormone Binding 74.4 24.6 - 122.0 nmol/L  Prolactin  Result Value Ref Range   Prolactin 15.9 4.8 - 23.3 ng/mL      Assessment & Plan:   Problem List Items Addressed This Visit      Other   Anxiety and depression    Not doing significantly  better. Tolerating the seroquel well. Will change to 50mg  extended release and recheck 2 weeks. Call with any concerns.        Other Visit Diagnoses    Upper respiratory tract infection, unspecified type    -  Primary   Concern for COVID. Will check swab. Self-quarantine until results are back. Will treat with prednisone and azithromycin. Call with any concerns or if not better   Relevant Medications   azithromycin (ZITHROMAX) 250 MG tablet   Other Relevant Orders   Novel Coronavirus, NAA ([facility])       Follow up plan: Return in about 2 weeks (around  08/21/2019).

## 2019-08-07 NOTE — Patient Instructions (Signed)
To schedule a COVID test, please  text "COVID" to [phone], OR you can log on to [email or web address] to easily make an on-line appointment. If you do not have access to a smart phone or PC, you can call [phone] to get assistance.  

## 2019-08-10 ENCOUNTER — Other Ambulatory Visit: Payer: BLUE CROSS/BLUE SHIELD

## 2019-08-10 ENCOUNTER — Encounter: Payer: Self-pay | Admitting: Family Medicine

## 2019-08-10 NOTE — Progress Notes (Signed)
Pt refused f/u and did not want to schedule at this time. Pt did not want a reminder call for physical she still needs to make apt for.

## 2019-08-10 NOTE — Assessment & Plan Note (Signed)
Not doing significantly better. Tolerating the seroquel well. Will change to 50mg  extended release and recheck 2 weeks. Call with any concerns.

## 2019-08-29 ENCOUNTER — Other Ambulatory Visit: Payer: Self-pay | Admitting: Family Medicine

## 2019-08-29 NOTE — Telephone Encounter (Signed)
Requesting for a 90 day refill to replace 30 day supply with refills.

## 2019-12-14 ENCOUNTER — Encounter: Payer: Self-pay | Admitting: Family Medicine

## 2019-12-14 ENCOUNTER — Other Ambulatory Visit: Payer: Self-pay

## 2019-12-14 ENCOUNTER — Telehealth (INDEPENDENT_AMBULATORY_CARE_PROVIDER_SITE_OTHER): Payer: PRIVATE HEALTH INSURANCE | Admitting: Family Medicine

## 2019-12-14 DIAGNOSIS — J069 Acute upper respiratory infection, unspecified: Secondary | ICD-10-CM

## 2019-12-14 MED ORDER — PREDNISONE 50 MG PO TABS: 50.0000 mg | ORAL_TABLET | Freq: Every day | ORAL | 0 refills | Status: DC

## 2019-12-14 MED ORDER — DOXYCYCLINE HYCLATE 100 MG PO TABS: 100.0000 mg | ORAL_TABLET | Freq: Two times a day (BID) | ORAL | 0 refills | Status: DC

## 2019-12-14 MED ORDER — QUETIAPINE FUMARATE ER 50 MG PO TB24: ORAL_TABLET | ORAL | 0 refills | Status: DC

## 2019-12-14 NOTE — Progress Notes (Signed)
There were no vitals taken for this visit.   Subjective:    Patient ID: Amber [name], female    DOB: 08/08/1980, 39 y.o.   MRN: [id number]  HPI: Amber [name] is a 39 y.o. female  Chief Complaint  Patient presents with  . Sinusitis    Ongoing appx 1 week  . Headache  . Nasal Congestion   UPPER RESPIRATORY TRACT INFECTION- fully vaccinated with pfizer Duration: about a week Worst symptom: congestion R side > L side Fever: unsure, but has been having chills and sweats Cough: yes Shortness of breath: no Wheezing: no Chest pain: no Chest tightness: no Chest congestion: yes Nasal congestion: yes Runny nose: yes Post nasal drip: yes Sneezing: yes Sore throat: yes Swollen glands: no Sinus pressure: yes Headache: yes Face pain: yes Toothache: yes Ear pain: yes "right Ear pressure: yes "right Eyes red/itching:no Eye drainage/crusting: no  Vomiting: no Rash: no Fatigue: yes Sick contacts: no Strep contacts: no  Context: stable Recurrent sinusitis: no Relief with OTC cold/cough medications: no  Treatments attempted: mucinex   Relevant past medical, surgical, family and social history reviewed and updated as indicated. Interim medical history since our last visit reviewed. Allergies and medications reviewed and updated.  Review of Systems  Constitutional: Positive for fatigue and fever. Negative for activity change, appetite change, chills, diaphoresis and unexpected weight change.  HENT: Positive for congestion, postnasal drip, rhinorrhea, sinus pressure, sinus pain and sore throat. Negative for dental problem, drooling, ear discharge, ear pain, facial swelling, hearing loss, mouth sores, nosebleeds, sneezing, tinnitus, trouble swallowing and voice change.     Per HPI unless specifically indicated above     Objective:    There were no vitals taken for this visit.  Wt Readings from Last 3 Encounters:  07/23/19 228 lb (103.4 kg)  07/18/18 224 lb (101.6 kg)    06/30/18 226 lb 3.2 oz (102.6 kg)    Physical Exam Vitals and nursing note reviewed.  Constitutional:      General: She is not in acute distress.    Appearance: Normal appearance. She is not ill-appearing, toxic-appearing or diaphoretic.  HENT:     Head: Normocephalic and atraumatic.     Right Ear: External ear normal.     Left Ear: External ear normal.     Nose: Nose normal.     Mouth/Throat:     Mouth: Mucous membranes are moist.     Pharynx: Oropharynx is clear.  Eyes:     General: No scleral icterus.       Right eye: No discharge.        Left eye: No discharge.     Conjunctiva/sclera: Conjunctivae normal.     Pupils: Pupils are equal, round, and reactive to light.  Pulmonary:     Effort: Pulmonary effort is normal. No respiratory distress.     Comments: Speaking in full sentences Musculoskeletal:        General: Normal range of motion.     Cervical back: Normal range of motion.  Skin:    Coloration: Skin is not jaundiced or pale.     Findings: No bruising, erythema, lesion or rash.  Neurological:     Mental Status: She is alert and oriented to person, place, and time. Mental status is at baseline.  Psychiatric:        Mood and Affect: Mood normal.        Behavior: Behavior normal.        Thought Content: Thought  content normal.        Judgment: Judgment normal.     Results for orders placed or performed in visit on 07/23/19  Microscopic Examination   URINE  Result Value Ref Range   WBC, UA None seen 0 - 5 /hpf   RBC 3-10 (A) 0 - 2 /hpf   Epithelial Cells (non renal) 0-10 0 - 10 /hpf   Bacteria, UA None seen None seen/Few  Comprehensive metabolic panel  Result Value Ref Range   Glucose 92 65 - 99 mg/dL   BUN 13 6 - 24 mg/dL   Creatinine, Ser 0.63 0.57 - 1.00 mg/dL   GFR calc non Af Amer 72 >59 mL/min/1.73   GFR calc Af Amer 82 >59 mL/min/1.73   BUN/Creatinine Ratio 13 9 - 23   Sodium 138 134 - 144 mmol/L   Potassium 4.1 3.5 - 5.2 mmol/L   Chloride 99 96  - 106 mmol/L   CO2 23 20 - 29 mmol/L   Calcium 9.7 8.7 - 10.2 mg/dL   Total Protein 7.4 6.0 - 8.5 g/dL   Albumin 5.0 (H) 3.8 - 4.8 g/dL   Globulin, Total 2.4 1.5 - 4.5 g/dL   Albumin/Globulin Ratio 2.1 1.2 - 2.2   Bilirubin Total 0.4 0.0 - 1.2 mg/dL   Alkaline Phosphatase 59 39 - 117 IU/L   AST 25 0 - 40 IU/L   ALT 41 (H) 0 - 32 IU/L  Lipid Panel w/o Chol/HDL Ratio  Result Value Ref Range   Cholesterol, Total 169 100 - 199 mg/dL   Triglycerides 016 (H) 0 - 149 mg/dL   HDL 49 >01 mg/dL   VLDL Cholesterol Cal 48 (H) 5 - 40 mg/dL   LDL Chol Calc (NIH) 72 0 - 99 mg/dL  TSH  Result Value Ref Range   TSH 1.090 0.450 - 4.500 uIU/mL  UA/M w/rflx Culture, Routine   Specimen: Urine   URINE  Result Value Ref Range   Specific Gravity, UA 1.015 1.005 - 1.030   pH, UA 5.5 5.0 - 7.5   Color, UA Yellow Yellow   Appearance Ur Clear Clear   Leukocytes,UA Negative Negative   Protein,UA Negative Negative/Trace   Glucose, UA Negative Negative   Ketones, UA Negative Negative   RBC, UA 3+ (A) Negative   Bilirubin, UA Negative Negative   Urobilinogen, Ur 0.2 0.2 - 1.0 mg/dL   Nitrite, UA Negative Negative   Microscopic Examination See below:   VITAMIN D 25 Hydroxy (Vit-D Deficiency, Fractures)  Result Value Ref Range   Vit D, 25-Hydroxy 16.3 (L) 30.0 - 100.0 ng/mL  Bayer DCA Hb A1c Waived  Result Value Ref Range   HB A1C (BAYER DCA - WAIVED) 5.0 <7.0 %  LH  Result Value Ref Range   LH 4.9 mIU/mL  FSH  Result Value Ref Range   FSH 8.3 mIU/mL  Estradiol  Result Value Ref Range   Estradiol 29.8 pg/mL  Testosterone, free, total(Labcorp/Sunquest)  Result Value Ref Range   Testosterone 12 8 - 48 ng/dL   Testosterone, Free 0.6 0.0 - 4.2 pg/mL   Sex Hormone Binding 74.4 24.6 - 122.0 nmol/L  Prolactin  Result Value Ref Range   Prolactin 15.9 4.8 - 23.3 ng/mL      Assessment & Plan:   Problem List Items Addressed This Visit    None    Visit Diagnoses    Upper respiratory tract  infection, unspecified type    -  Primary   Fully vaccinated.  Will treat with prednisone. If not improving in 2-3 days, start doxycycline. Call with any concerns. Continue to monitor.        Follow up plan: Return July regular follow up.    . This visit was completed via MyChart due to the restrictions of the COVID-19 pandemic. All issues as above were discussed and addressed. Physical exam was done as above through visual confirmation on MyChart. If it was felt that the patient should be evaluated in the office, they were directed there. The patient verbally consented to this visit. . Location of the patient: home . Location of the provider: work . Those involved with this call:  . Provider: Park Liter, DO . CMA: Lesle Chris, [place] . Front Desk/Registration: Don Perking  . Time spent on call: 15 minutes with patient face to face via video conference. More than 50% of this time was spent in counseling and coordination of care. 23 minutes total spent in review of patient's record and preparation of their chart.

## 2019-12-15 ENCOUNTER — Telehealth: Payer: Self-pay | Admitting: Family Medicine

## 2019-12-15 NOTE — Telephone Encounter (Signed)
lvm to make this apt.  

## 2019-12-15 NOTE — Telephone Encounter (Signed)
-----   Message from Dorcas Carrow, [place] sent at 12/14/2019  3:10 PM EDT ----- July for regular follow up

## 2019-12-16 ENCOUNTER — Encounter: Payer: Self-pay | Admitting: Family Medicine

## 2019-12-16 NOTE — Telephone Encounter (Signed)
Lvm to make this apt. Sent letter.  

## 2019-12-17 NOTE — Telephone Encounter (Signed)
Lvm to make this apt. 

## 2020-01-11 ENCOUNTER — Ambulatory Visit (INDEPENDENT_AMBULATORY_CARE_PROVIDER_SITE_OTHER): Payer: Self-pay | Admitting: Family Medicine

## 2020-01-11 ENCOUNTER — Other Ambulatory Visit: Payer: Self-pay

## 2020-01-11 ENCOUNTER — Encounter: Payer: Self-pay | Admitting: Family Medicine

## 2020-01-11 VITALS — BP 131/74 | HR 74 | Temp 98.7°F | Wt 229.2 lb

## 2020-01-11 DIAGNOSIS — F329 Major depressive disorder, single episode, unspecified: Secondary | ICD-10-CM

## 2020-01-11 DIAGNOSIS — F419 Anxiety disorder, unspecified: Secondary | ICD-10-CM

## 2020-01-11 MED ORDER — QUETIAPINE FUMARATE ER 50 MG PO TB24: 50.0000 mg | ORAL_TABLET | Freq: Every day | ORAL | 1 refills | Status: DC

## 2020-01-11 MED ORDER — VENLAFAXINE HCL ER 75 MG PO CP24: ORAL_CAPSULE | ORAL | 3 refills | Status: DC

## 2020-01-11 NOTE — Progress Notes (Signed)
BP 131/74 (BP Location: Right Arm, Patient Position: Sitting, Cuff Size: Normal)   Pulse 74   Temp 98.7 F (37.1 C) (Oral)   Wt 229 lb 3.2 oz (104 kg)   LMP 01/01/2020 (Approximate)   SpO2 96%   BMI 42.61 kg/m    Subjective:    Patient ID: Amber [name], female    DOB: December 30, 1980, 39 y.o.   MRN: [id number]  HPI: Amber [name] is a 39 y.o. female  Chief Complaint  Patient presents with  . Depression  . Anxiety   ANXIETY/DEPRESSION- in excerbation. Her husband quit his job and she's under a lot of stress Duration: Chronic, worse in the past few weeks Status:exacerbated Anxious mood: yes  Excessive worrying: yes Irritability: yes  Sweating: no Nausea: no Palpitations:no Hyperventilation: no Panic attacks: no Agoraphobia: no  Obscessions/compulsions: no Depressed mood: yes Depression screen [facility] 2/9 01/11/2020 12/14/2019 08/10/2019 07/23/2019 06/30/2018  Decreased Interest 2 2 2 2 1   Down, Depressed, Hopeless 2 0 2 2 1   PHQ - 2 Score 4 2 4 4 2   Altered sleeping 2 1 2 3 1   Tired, decreased energy 2 2 2 2 1   Change in appetite 2 1 3 1 2   Feeling bad or failure about yourself  2 2 3 2 2   Trouble concentrating 1 2 2 1 1   Moving slowly or fidgety/restless 1 0 0 1 0  Suicidal thoughts 1 0 1 1 1   PHQ-9 Score 15 10 17 15 10   Difficult doing work/chores Somewhat difficult Somewhat difficult Somewhat difficult Very difficult Somewhat difficult  Some recent data might be hidden   Anhedonia: no Weight changes: no Insomnia: yes hard to fall asleep  Hypersomnia: yes Fatigue/loss of energy: yes Feelings of worthlessness: yes Feelings of guilt: yes Impaired concentration/indecisiveness: no Suicidal ideations: no  Crying spells: yes Recent Stressors/Life Changes: yes   Relationship problems: yes   Family stress: yes     Financial stress: yes    Job stress: yes    Recent death/loss: no  Relevant past medical, surgical, family and social history reviewed and updated as  indicated. Interim medical history since our last visit reviewed. Allergies and medications reviewed and updated.  Review of Systems  Constitutional: Negative.   Respiratory: Negative.   Cardiovascular: Negative.   Gastrointestinal: Negative.   Musculoskeletal: Negative.   Skin: Negative.   Neurological: Negative.   Psychiatric/Behavioral: Positive for dysphoric mood and sleep disturbance. Negative for agitation, behavioral problems, confusion, decreased concentration, hallucinations, self-injury and suicidal ideas. The patient is nervous/anxious. The patient is not hyperactive.     Per HPI unless specifically indicated above     Objective:    BP 131/74 (BP Location: Right Arm, Patient Position: Sitting, Cuff Size: Normal)   Pulse 74   Temp 98.7 F (37.1 C) (Oral)   Wt 229 lb 3.2 oz (104 kg)   LMP 01/01/2020 (Approximate)   SpO2 96%   BMI 42.61 kg/m   Wt Readings from Last 3 Encounters:  01/11/20 229 lb 3.2 oz (104 kg)  07/23/19 228 lb (103.4 kg)  07/18/18 224 lb (101.6 kg)    Physical Exam Vitals and nursing note reviewed.  Constitutional:      General: She is not in acute distress.    Appearance: Normal appearance. She is not ill-appearing, toxic-appearing or diaphoretic.  HENT:     Head: Normocephalic and atraumatic.     Right Ear: External ear normal.     Left Ear: External ear normal.  Nose: Nose normal.     Mouth/Throat:     Mouth: Mucous membranes are moist.     Pharynx: Oropharynx is clear.  Eyes:     General: No scleral icterus.       Right eye: No discharge.        Left eye: No discharge.     Extraocular Movements: Extraocular movements intact.     Conjunctiva/sclera: Conjunctivae normal.     Pupils: Pupils are equal, round, and reactive to light.  Cardiovascular:     Rate and Rhythm: Normal rate and regular rhythm.     Pulses: Normal pulses.     Heart sounds: Normal heart sounds. No murmur heard.  No friction rub. No gallop.   Pulmonary:      Effort: Pulmonary effort is normal. No respiratory distress.     Breath sounds: Normal breath sounds. No stridor. No wheezing, rhonchi or rales.  Chest:     Chest wall: No tenderness.  Musculoskeletal:        General: Normal range of motion.     Cervical back: Normal range of motion and neck supple.  Skin:    General: Skin is warm and dry.     Capillary Refill: Capillary refill takes less than 2 seconds.     Coloration: Skin is not jaundiced or pale.     Findings: No bruising, erythema, lesion or rash.  Neurological:     General: No focal deficit present.     Mental Status: She is alert and oriented to person, place, and time. Mental status is at baseline.  Psychiatric:        Mood and Affect: Mood normal.        Behavior: Behavior normal.        Thought Content: Thought content normal.        Judgment: Judgment normal.     Results for orders placed or performed in visit on 07/23/19  Microscopic Examination   URINE  Result Value Ref Range   WBC, UA None seen 0 - 5 /hpf   RBC 3-10 (A) 0 - 2 /hpf   Epithelial Cells (non renal) 0-10 0 - 10 /hpf   Bacteria, UA None seen None seen/Few  Comprehensive metabolic panel  Result Value Ref Range   Glucose 92 65 - 99 mg/dL   BUN 13 6 - 24 mg/dL   Creatinine, Ser 5.36 0.57 - 1.00 mg/dL   GFR calc non Af Amer 72 >59 mL/min/1.73   GFR calc Af Amer 82 >59 mL/min/1.73   BUN/Creatinine Ratio 13 9 - 23   Sodium 138 134 - 144 mmol/L   Potassium 4.1 3.5 - 5.2 mmol/L   Chloride 99 96 - 106 mmol/L   CO2 23 20 - 29 mmol/L   Calcium 9.7 8.7 - 10.2 mg/dL   Total Protein 7.4 6.0 - 8.5 g/dL   Albumin 5.0 (H) 3.8 - 4.8 g/dL   Globulin, Total 2.4 1.5 - 4.5 g/dL   Albumin/Globulin Ratio 2.1 1.2 - 2.2   Bilirubin Total 0.4 0.0 - 1.2 mg/dL   Alkaline Phosphatase 59 39 - 117 IU/L   AST 25 0 - 40 IU/L   ALT 41 (H) 0 - 32 IU/L  Lipid Panel w/o Chol/HDL Ratio  Result Value Ref Range   Cholesterol, Total 169 100 - 199 mg/dL   Triglycerides 144 (H) 0  - 149 mg/dL   HDL 49 >31 mg/dL   VLDL Cholesterol Cal 48 (H) 5 - 40 mg/dL  LDL Chol Calc (NIH) 72 0 - 99 mg/dL  TSH  Result Value Ref Range   TSH 1.090 0.450 - 4.500 uIU/mL  UA/M w/rflx Culture, Routine   Specimen: Urine   URINE  Result Value Ref Range   Specific Gravity, UA 1.015 1.005 - 1.030   pH, UA 5.5 5.0 - 7.5   Color, UA Yellow Yellow   Appearance Ur Clear Clear   Leukocytes,UA Negative Negative   Protein,UA Negative Negative/Trace   Glucose, UA Negative Negative   Ketones, UA Negative Negative   RBC, UA 3+ (A) Negative   Bilirubin, UA Negative Negative   Urobilinogen, Ur 0.2 0.2 - 1.0 mg/dL   Nitrite, UA Negative Negative   Microscopic Examination See below:   VITAMIN D 25 Hydroxy (Vit-D Deficiency, Fractures)  Result Value Ref Range   Vit D, 25-Hydroxy 16.3 (L) 30.0 - 100.0 ng/mL  Bayer DCA Hb A1c Waived  Result Value Ref Range   HB A1C (BAYER DCA - WAIVED) 5.0 <7.0 %  LH  Result Value Ref Range   LH 4.9 mIU/mL  FSH  Result Value Ref Range   FSH 8.3 mIU/mL  Estradiol  Result Value Ref Range   Estradiol 29.8 pg/mL  Testosterone, free, total(Labcorp/Sunquest)  Result Value Ref Range   Testosterone 12 8 - 48 ng/dL   Testosterone, Free 0.6 0.0 - 4.2 pg/mL   Sex Hormone Binding 74.4 24.6 - 122.0 nmol/L  Prolactin  Result Value Ref Range   Prolactin 15.9 4.8 - 23.3 ng/mL      Assessment & Plan:   Problem List Items Addressed This Visit      Other   Anxiety and depression - Primary    Not under good control. Will increase her seroquel to 50-100mg  PRN and add effexor. List of counselors given. Referral to social work also given. Call with any concerns. Continue to monitor.       Relevant Medications   venlafaxine XR (EFFEXOR XR) 75 MG 24 hr capsule   Other Relevant Orders   Referral to Chronic Care Management Services       Follow up plan: Return in about 4 weeks (around 02/08/2020).

## 2020-01-11 NOTE — Assessment & Plan Note (Signed)
Not under good control. Will increase her seroquel to 50-100mg  PRN and add effexor. List of counselors given. Referral to social work also given. Call with any concerns. Continue to monitor.

## 2020-01-24 ENCOUNTER — Other Ambulatory Visit: Payer: Self-pay | Admitting: Family Medicine

## 2020-01-24 NOTE — Telephone Encounter (Signed)
Requested medication (s) are due for refill today: yes  Requested medication (s) are on the active medication list: yes  Last refill:  07/24/19  Future visit scheduled: yes  Notes to clinic:  med not delegated to NT to RF   Requested Prescriptions  Pending Prescriptions Disp Refills   Vitamin D, Ergocalciferol, (DRISDOL) 1.25 MG (50000 UNIT) CAPS capsule [Pharmacy Med Name: VITAMIN D2 1.25MG (50,000 UNIT)] 4 capsule 5    Sig: Take 1 capsule (50,000 Units total) by mouth every 7 (seven) days.      Endocrinology:  Vitamins - Vitamin D Supplementation Failed - 01/24/2020  9:06 AM      Failed - 50,000 IU strengths are not delegated      Failed - Phosphate in normal range and within 360 days    No results found for: PHOS        Failed - Vitamin D in normal range and within 360 days    Vit D, 25-Hydroxy  Date Value Ref Range Status  07/23/2019 16.3 (L) 30.0 - 100.0 ng/mL Final    Comment:    Vitamin D deficiency has been defined by the Institute of Medicine and an Endocrine Society practice guideline as a level of serum 25-OH vitamin D less than 20 ng/mL (1,2). The Endocrine Society went on to further define vitamin D insufficiency as a level between 21 and 29 ng/mL (2). 1. IOM (Institute of Medicine). 2010. Dietary reference    intakes for calcium and D. [place]: The    [employer]. 2. Holick MF, Binkley Dunlap, Bischoff-Ferrari HA, et al.    Evaluation, treatment, and prevention of vitamin D    deficiency: an Endocrine Society clinical practice    guideline. JCEM. 2011 Jul; 96(7):1911-30.           Passed - Ca in normal range and within 360 days    Calcium  Date Value Ref Range Status  07/23/2019 9.7 8.7 - 10.2 mg/dL Final          Passed - Valid encounter within last 12 months    Recent Outpatient Visits           1 week ago Anxiety and depression   [facility] [place], Megan P, DO   1 month ago Upper respiratory tract infection,  unspecified type   [facility], Megan P, DO   5 months ago Upper respiratory tract infection, unspecified type   [facility], Megan P, DO   6 months ago Anxiety and depression   [facility] [place], [place], DO   1 year ago Bronchitis   [facility] [place], [place], DO       Future Appointments             In 2 weeks Laural Benes, Oralia Rud, DO [facility]

## 2020-02-02 ENCOUNTER — Other Ambulatory Visit: Payer: Self-pay | Admitting: Family Medicine

## 2020-02-02 NOTE — Telephone Encounter (Signed)
Routing to provider to advise.  

## 2020-02-02 NOTE — Telephone Encounter (Signed)
° °  Notes to clinic:  REQUEST FOR 90 DAYS PRESCRIPTION   Requested Prescriptions  Pending Prescriptions Disp Refills   venlafaxine XR (EFFEXOR-XR) 75 MG 24 hr capsule [Pharmacy Med Name: VENLAFAXINE HCL ER 75 MG CAP] 180 capsule 2    Sig: Take 1 pill daily for 2 weeks, then increase to 2 pills      Psychiatry: Antidepressants - SNRI - desvenlafaxine & venlafaxine Failed - 02/02/2020  1:34 PM      Failed - LDL in normal range and within 360 days    LDL Chol Calc (NIH)  Date Value Ref Range Status  07/23/2019 72 0 - 99 mg/dL Final          Failed - Triglycerides in normal range and within 360 days    Triglycerides  Date Value Ref Range Status  07/23/2019 302 (H) 0 - 149 mg/dL Final          Passed - Total Cholesterol in normal range and within 360 days    Cholesterol, Total  Date Value Ref Range Status  07/23/2019 169 100 - 199 mg/dL Final          Passed - Completed PHQ-2 or PHQ-9 in the last 360 days.      Passed - Last BP in normal range    BP Readings from Last 1 Encounters:  01/11/20 131/74          Passed - Valid encounter within last 6 months    Recent Outpatient Visits           3 weeks ago Anxiety and depression   [facility] [place], Megan P, DO   1 month ago Upper respiratory tract infection, unspecified type   [facility], Megan P, DO   5 months ago Upper respiratory tract infection, unspecified type   [facility] [place], Megan P, DO   6 months ago Anxiety and depression   [facility] [place], [place], DO   1 year ago Bronchitis   [facility] [place], [place], DO       Future Appointments             In 1 week Laural Benes, Oralia Rud, DO [facility]

## 2020-02-05 ENCOUNTER — Telehealth: Payer: Self-pay | Admitting: Licensed Clinical Social Worker

## 2020-02-05 ENCOUNTER — Telehealth: Payer: PRIVATE HEALTH INSURANCE

## 2020-02-05 NOTE — Telephone Encounter (Signed)
  Chronic Care Management    Clinical Social Work General Follow Up Note  02/05/2020 Name: Amber [name] MRN: [id number] DOB: 08-01-1980  Amber [name] is a 39 y.o. year old female who is a primary care patient of Dorcas Carrow, DOThe CCM team was consulted for assistance with social work support.   Review of patient status, including review of consultants reports, relevant laboratory and other test results, and collaboration with appropriate care team members and the patient's provider was performed as part of comprehensive patient evaluation and provision of chronic care management services.    Outpatient Encounter Medications as of 02/05/2020  Medication Sig Note  . meloxicam (MOBIC) 15 MG tablet Take 1 tablet (15 mg total) by mouth daily. (Patient not taking: Reported on 01/11/2020) 07/23/2019: As needed  . Multiple Vitamin (MULTI-VITAMINS) TABS Take by mouth.   . Omeprazole (PRILOSEC PO) Take by mouth as needed.   Marland Kitchen QUEtiapine (SEROQUEL XR) 50 MG TB24 24 hr tablet Take 1-2 tablets (50-100 mg total) by mouth at bedtime. TAKE 1 TABLET BY MOUTH EVERYDAY AT BEDTIME   . venlafaxine XR (EFFEXOR-XR) 75 MG 24 hr capsule TAKE 1 PILL DAILY FOR 2 WEEKS, THEN INCREASE TO 2 PILLS   . Vitamin D, Ergocalciferol, (DRISDOL) 1.25 MG (50000 UNIT) CAPS capsule TAKE 1 CAPSULE (50,000 UNITS TOTAL) BY MOUTH EVERY 7 (SEVEN) DAYS.    No facility-administered encounter medications on file as of 02/05/2020.   LCSW completed CCM outreach attempt today but was unable to reach patient successfully. A HIPPA compliant voice message was left encouraging patient to return call once available. LCSW will reschedule CCM SW appointment as well.  Follow Up Plan: Scheduling Care Guide will reach out to patient to reschedule appointment.   Dickie La, BSW, MSW, LCSW [facility] Care Management   [facility] [email or web address] Phone: [phone]

## 2020-02-09 ENCOUNTER — Telehealth: Payer: Self-pay

## 2020-02-09 NOTE — Chronic Care Management (AMB) (Signed)
  Care Management   Note  02/09/2020 Name: SURYA FOLDEN MRN: [id number] DOB: [birth year]  Amber [name] is a 39 y.o. year old female who is a primary care patient of Dorcas Carrow, DO and is actively engaged with the care management team. I reached out to Evelena Asa by phone today to assist with re-scheduling an initial visit with the Licensed Clinical Social Worker  Follow up plan: Unsuccessful telephone outreach attempt made. A HIPPA compliant phone message was left for the patient providing contact information and requesting a return call.  The care management team will reach out to the patient again over the next 7 days.  If patient returns call to provider office, please advise to call Embedded Care Management Care Guide Penne Lash  at [phone]  Penne Lash, RMA Care Guide, Embedded Care Coordination [facility]  [place], [place] [address] Direct Dial: [phone] [email or web address]: [email or web address]

## 2020-02-12 ENCOUNTER — Ambulatory Visit: Payer: PRIVATE HEALTH INSURANCE | Admitting: Family Medicine

## 2020-02-17 NOTE — Chronic Care Management (AMB) (Signed)
  Care Management   Note  02/17/2020 Name: Amber [name] MRN: [id number] DOB: 1980/08/18  Amber [name] is a 39 y.o. year old female who is a primary care patient of Dorcas Carrow, DO and is actively engaged with the care management team. I reached out to Evelena Asa by phone today to assist with re-scheduling an initial visit with the Licensed Clinical Social Worker  Follow up plan: Unsuccessful telephone outreach attempt made. A HIPPA compliant phone message was left for the patient providing contact information and requesting a return call.  The care management team will reach out to the patient again over the next 7 days.  If patient returns call to provider office, please advise to call Embedded Care Management Care Guide Penne Lash  at [phone]  Penne Lash, RMA Care Guide, Embedded Care Coordination [facility]  [place], [place] [address] Direct Dial: [phone] [email or web address] Website: [email or web address]

## 2020-02-24 NOTE — Chronic Care Management (AMB) (Signed)
  Care Management   Note  02/24/2020 Name: Amber [name] MRN: [id number] DOB: April 09, 1981  Amber [name] is a 39 y.o. year old female who is a primary care patient of Dorcas Carrow, DO and is actively engaged with the care management team. I reached out to Evelena Asa by phone today to assist with re-scheduling an initial visit with the Licensed Clinical Social Worker  Follow up plan: Unable to make contact on outreach attempts x 3. PCP Olevia Perches, DO  notified via routed documentation in medical record.   Penne Lash, RMA Care Guide, Embedded Care Coordination [facility]  [place], [place] [address] Direct Dial: [phone] [email or web address]: [email or web address]

## 2020-02-24 NOTE — Telephone Encounter (Signed)
Third unsuccessful outreach  

## 2020-03-09 ENCOUNTER — Other Ambulatory Visit: Payer: Self-pay | Admitting: Family Medicine

## 2020-03-09 NOTE — Telephone Encounter (Signed)
Requested medications are due for refill today?  Yes - this medication refill cannot be delegated.    Requested medications are on active medication list?  Yes  Last Refill:  01/11/2020  # 135 with one refill   Future visit scheduled?  No   Notes to Clinic:  This medication refill cannot be delegated.

## 2020-03-09 NOTE — Telephone Encounter (Signed)
Routing to provider  

## 2020-05-09 ENCOUNTER — Other Ambulatory Visit: Payer: Self-pay | Admitting: Family Medicine

## 2020-05-12 ENCOUNTER — Ambulatory Visit: Payer: Self-pay | Admitting: Family Medicine

## 2020-05-12 NOTE — Telephone Encounter (Signed)
Pt. Called to report onset of "intermittent dizziness, headache and body not feeling quite right over past few days."  BP 165/101 @ 7:00 PM, last evening.  Reported BP today, at 7:45 AM, 144/90, and @ 9:06 AM, 143/97, pulse 103.  Denied any palpitations or chest discomfort, or SOB. Denied loss of vision, gen. weakness, weakness/ numbness of extremities, or speech difficulty.  Pt. Given appt. For 11/19 with PCP. (prefers to see PCP)  Care advice given per protocol.  Encouraged to call back if symptoms worsen.  Pt. Verb. Understanding.  Agreed with plan.      Reason for Disposition . Systolic BP  >= 160 OR Diastolic >= 100  Answer Assessment - Initial Assessment Questions 1. BLOOD PRESSURE: "What is the blood pressure?" "Did you take at least two measurements 5 minutes apart?"     7:45 AM 144/90; 11/17 7:00 PM 165/101 2. ONSET: "When did you take your blood pressure?"     See above 3. HOW: "How did you obtain the blood pressure?" (e.g., visiting nurse, automatic home BP monitor)     [facility] BP machine; borrowed a friend's home monitor 4. HISTORY: "Do you have a history of high blood pressure?"     Denied any hx of BP issues 5. MEDICATIONS: "Are you taking any medications for blood pressure?" "Have you missed any doses recently?"     No  6. OTHER SYMPTOMS: "Do you have any symptoms?" (e.g., headache, chest pain, blurred vision, difficulty breathing, weakness)     C/o intermittent dizziness; c/o headache; denied weakness or chest pain, or shortness of breath  7. PREGNANCY: "Is there any chance you are pregnant?" "When was your last menstrual period?"     LMP 11/1  Protocols used: BLOOD PRESSURE - HIGH-A-AH

## 2020-05-13 ENCOUNTER — Ambulatory Visit (INDEPENDENT_AMBULATORY_CARE_PROVIDER_SITE_OTHER): Payer: PRIVATE HEALTH INSURANCE | Admitting: Family Medicine

## 2020-05-13 ENCOUNTER — Other Ambulatory Visit: Payer: Self-pay

## 2020-05-13 ENCOUNTER — Encounter: Payer: Self-pay | Admitting: Family Medicine

## 2020-05-13 VITALS — BP 122/77 | HR 98 | Temp 98.8°F | Ht 61.46 in | Wt 239.0 lb

## 2020-05-13 DIAGNOSIS — R42 Dizziness and giddiness: Secondary | ICD-10-CM | POA: Diagnosis not present

## 2020-05-13 DIAGNOSIS — G4486 Cervicogenic headache: Secondary | ICD-10-CM | POA: Diagnosis not present

## 2020-05-13 MED ORDER — NAPROXEN 500 MG PO TABS: 500.0000 mg | ORAL_TABLET | Freq: Two times a day (BID) | ORAL | 3 refills | Status: DC

## 2020-05-13 MED ORDER — MECLIZINE HCL 25 MG PO TABS: 25.0000 mg | ORAL_TABLET | Freq: Three times a day (TID) | ORAL | 2 refills | Status: DC | PRN

## 2020-05-13 NOTE — Progress Notes (Signed)
BP 122/77   Pulse 98   Temp 98.8 F (37.1 C) (Oral)   Ht 5' 1.46" (1.561 m)   Wt 239 lb (108.4 kg)   SpO2 99%   BMI 44.49 kg/m    Subjective:    Patient ID: Amber [name], female    DOB: Sep 30, 1980, 39 y.o.   MRN: [id number]  HPI: Amber [name] is a 39 y.o. female  Chief Complaint  Patient presents with  . Headache    All for the past few days.  . Dizziness    feeling fuzzy headed.  . Hypertension    Runing High past few days   Started with a headache a couple of days ago. [facility] 165/101. Went home and checked it at home at it was running 145/97, that's been what it's been running the past couple of days. She has been using an automatic cuff.   DIZZINESS Duration: about a week Description of symptoms: like being on a merry-go-round, feels like her head is spinning Duration of episode: goes away with laying down Dizziness frequency: recurrent- with getting up  Provoking factors: moving around, doing anything Triggered by rolling over in bed: yes Triggered by bending over: yes Aggravated by head movement: no Aggravated by exertion, coughing, loud noises: no Recent head injury: no Recent or current viral symptoms: no History of vasovagal episodes: no Nausea: no Vomiting: yes- after eating a burger from [facility] last week Tinnitus: no Hearing loss: no Aural fullness: no Headache: yes Photophobia/phonophobia: yes Unsteady gait: no Postural instability: no Diplopia, dysarthria, dysphagia or weakness: no Related to exertion: no Pallor: no Diaphoresis: yes Dyspnea: no Chest pain: no  Relevant past medical, surgical, family and social history reviewed and updated as indicated. Interim medical history since our last visit reviewed. Allergies and medications reviewed and updated.  Review of Systems  Constitutional: Positive for diaphoresis and fatigue. Negative for activity change, appetite change, chills, fever and unexpected weight change.    HENT: Negative.   Respiratory: Negative.   Cardiovascular: Negative.   Gastrointestinal: Negative.   Musculoskeletal: Negative.   Neurological: Positive for dizziness, light-headedness and headaches. Negative for tremors, seizures, syncope, facial asymmetry, speech difficulty, weakness and numbness.  Psychiatric/Behavioral: Negative.     Per HPI unless specifically indicated above     Objective:    BP 122/77   Pulse 98   Temp 98.8 F (37.1 C) (Oral)   Ht 5' 1.46" (1.561 m)   Wt 239 lb (108.4 kg)   SpO2 99%   BMI 44.49 kg/m   Wt Readings from Last 3 Encounters:  05/13/20 239 lb (108.4 kg)  01/11/20 229 lb 3.2 oz (104 kg)  07/23/19 228 lb (103.4 kg)   No data found.  Physical Exam Vitals and nursing note reviewed.  Constitutional:      General: She is not in acute distress.    Appearance: Normal appearance. She is not ill-appearing, toxic-appearing or diaphoretic.  HENT:     Head: Normocephalic and atraumatic.     Right Ear: External ear normal.     Left Ear: External ear normal.     Nose: Nose normal.     Mouth/Throat:     Mouth: Mucous membranes are moist.     Pharynx: Oropharynx is clear.  Eyes:     General: No scleral icterus.       Right eye: No discharge.        Left eye: No discharge.  Extraocular Movements: Extraocular movements intact.     Left eye: Nystagmus present.     Conjunctiva/sclera: Conjunctivae normal.     Pupils: Pupils are equal, round, and reactive to light.  Neck:     Comments: Hypertonic paraspinal muscles Cardiovascular:     Rate and Rhythm: Normal rate and regular rhythm.     Pulses: Normal pulses.     Heart sounds: Normal heart sounds. No murmur heard.  No friction rub. No gallop.   Pulmonary:     Effort: Pulmonary effort is normal. No respiratory distress.     Breath sounds: Normal breath sounds. No stridor. No wheezing, rhonchi or rales.  Chest:     Chest wall: No tenderness.  Musculoskeletal:        General: Normal range  of motion.     Cervical back: Normal range of motion and neck supple.  Skin:    General: Skin is warm and dry.     Capillary Refill: Capillary refill takes less than 2 seconds.     Coloration: Skin is not jaundiced or pale.     Findings: No bruising, erythema, lesion or rash.  Neurological:     General: No focal deficit present.     Mental Status: She is alert and oriented to person, place, and time. Mental status is at baseline.  Psychiatric:        Mood and Affect: Mood normal.        Behavior: Behavior normal.        Thought Content: Thought content normal.        Judgment: Judgment normal.     Results for orders placed or performed in visit on 07/23/19  Microscopic Examination   URINE  Result Value Ref Range   WBC, UA None seen 0 - 5 /hpf   RBC 3-10 (A) 0 - 2 /hpf   Epithelial Cells (non renal) 0-10 0 - 10 /hpf   Bacteria, UA None seen None seen/Few  Comprehensive metabolic panel  Result Value Ref Range   Glucose 92 65 - 99 mg/dL   BUN 13 6 - 24 mg/dL   Creatinine, Ser 8.67 0.57 - 1.00 mg/dL   GFR calc non Af Amer 72 >59 mL/min/1.73   GFR calc Af Amer 82 >59 mL/min/1.73   BUN/Creatinine Ratio 13 9 - 23   Sodium 138 134 - 144 mmol/L   Potassium 4.1 3.5 - 5.2 mmol/L   Chloride 99 96 - 106 mmol/L   CO2 23 20 - 29 mmol/L   Calcium 9.7 8.7 - 10.2 mg/dL   Total Protein 7.4 6.0 - 8.5 g/dL   Albumin 5.0 (H) 3.8 - 4.8 g/dL   Globulin, Total 2.4 1.5 - 4.5 g/dL   Albumin/Globulin Ratio 2.1 1.2 - 2.2   Bilirubin Total 0.4 0.0 - 1.2 mg/dL   Alkaline Phosphatase 59 39 - 117 IU/L   AST 25 0 - 40 IU/L   ALT 41 (H) 0 - 32 IU/L  Lipid Panel w/o Chol/HDL Ratio  Result Value Ref Range   Cholesterol, Total 169 100 - 199 mg/dL   Triglycerides 619 (H) 0 - 149 mg/dL   HDL 49 >50 mg/dL   VLDL Cholesterol Cal 48 (H) 5 - 40 mg/dL   LDL Chol Calc (NIH) 72 0 - 99 mg/dL  TSH  Result Value Ref Range   TSH 1.090 0.450 - 4.500 uIU/mL  UA/M w/rflx Culture, Routine   Specimen: Urine    URINE  Result Value Ref Range  Specific Gravity, UA 1.015 1.005 - 1.030   pH, UA 5.5 5.0 - 7.5   Color, UA Yellow Yellow   Appearance Ur Clear Clear   Leukocytes,UA Negative Negative   Protein,UA Negative Negative/Trace   Glucose, UA Negative Negative   Ketones, UA Negative Negative   RBC, UA 3+ (A) Negative   Bilirubin, UA Negative Negative   Urobilinogen, Ur 0.2 0.2 - 1.0 mg/dL   Nitrite, UA Negative Negative   Microscopic Examination See below:   VITAMIN D 25 Hydroxy (Vit-D Deficiency, Fractures)  Result Value Ref Range   Vit D, 25-Hydroxy 16.3 (L) 30.0 - 100.0 ng/mL  Bayer DCA Hb A1c Waived  Result Value Ref Range   HB A1C (BAYER DCA - WAIVED) 5.0 <7.0 %  LH  Result Value Ref Range   LH 4.9 mIU/mL  FSH  Result Value Ref Range   FSH 8.3 mIU/mL  Estradiol  Result Value Ref Range   Estradiol 29.8 pg/mL  Testosterone, free, total([facility]/Sun[facility])  Result Value Ref Range   Testosterone 12 8 - 48 ng/dL   Testosterone, Free 0.6 0.0 - 4.2 pg/mL   Sex Hormone Binding 74.4 24.6 - 122.0 nmol/L  Prolactin  Result Value Ref Range   Prolactin 15.9 4.8 - 23.3 ng/mL      Assessment & Plan:   Problem List Items Addressed This Visit    None    Visit Diagnoses    Vertigo    -  Primary   Will treat with meclizine and epley's. Warning signs to go to ER discussed. If not better by next week, let us know. Call with any concerns.    Cervicogenic headache       Will treat with naproxen. Call with any concerns or if not getting better.    Relevant Medications   naproxen (NAPROSYN) 500 MG tablet       Follow up plan: Return if symptoms worsen or fail to improve.

## 2020-05-13 NOTE — Patient Instructions (Signed)
Vertigo Vertigo is the feeling that you or the things around you are moving when they are not. This feeling can come and go at any time. Vertigo often goes away on its own. This condition can be dangerous if it happens when you are doing activities like driving or working with machines. Your doctor will do tests to find the cause of your vertigo. These tests will also help your doctor decide on the best treatment for you. Follow these instructions at home: Eating and drinking      Drink enough fluid to keep your pee (urine) pale yellow.  Do not drink alcohol. Activity  Return to your normal activities as told by your doctor. Ask your doctor what activities are safe for you.  In the morning, first sit up on the side of the bed. When you feel okay, stand slowly while you hold onto something until you know that your balance is fine.  Move slowly. Avoid sudden body or head movements or certain positions, as told by your doctor.  Use a cane if you have trouble standing or walking.  Sit down right away if you feel dizzy.  Avoid doing any tasks or activities that can cause danger to you or others if you get dizzy.  Avoid bending down if you feel dizzy. Place items in your home so that they are easy for you to reach without leaning over.  Do not drive or use heavy machinery if you feel dizzy. General instructions  Take over-the-counter and prescription medicines only as told by your doctor.  Keep all follow-up visits as told by your doctor. This is important. Contact a doctor if:  Your medicine does not help your vertigo.  You have a fever.  Your problems get worse or you have new symptoms.  Your family or friends see changes in your behavior.  The feeling of being sick to your stomach gets worse.  Your vomiting gets worse.  You lose feeling (have numbness) in part of your body.  You feel prickling and tingling in a part of your body. Get help right away if:  You have  trouble moving or talking.  You are always dizzy.  You pass out (faint).  You get very bad headaches.  You feel weak in your hands, arms, or legs.  You have changes in your hearing.  You have changes in how you see (vision).  You get a stiff neck.  Bright light starts to bother you. Summary  Vertigo is the feeling that you or the things around you are moving when they are not.  Your doctor will do tests to find the cause of your vertigo.  You may be told to avoid some tasks, positions, or movements.  Contact a doctor if your medicine is not helping, or if you have a fever, new symptoms, or a change in behavior.  Get help right away if you get very bad headaches, or if you have changes in how you speak, hear, or see. This information is not intended to replace advice given to you by your health care provider. Make sure you discuss any questions you have with your health care provider. Document Revised: 05/05/2018 Document Reviewed: 05/05/2018 Elsevier Patient Education  2020 Elsevier Inc.   How to Perform the Epley Maneuver The Epley maneuver is an exercise that relieves symptoms of vertigo. Vertigo is the feeling that you or your surroundings are moving when they are not. When you feel vertigo, you may feel like the room   is spinning and have trouble walking. Dizziness is a little different than vertigo. When you are dizzy, you may feel unsteady or light-headed. You can do this maneuver at home whenever you have symptoms of vertigo. You can do it up to 3 times a day until your symptoms go away. Even though the Epley maneuver may relieve your vertigo for a few weeks, it is possible that your symptoms will return. This maneuver relieves vertigo, but it does not relieve dizziness. What are the risks? If it is done correctly, the Epley maneuver is considered safe. Sometimes it can lead to dizziness or nausea that goes away after a short time. If you develop other symptoms, such as  changes in vision, weakness, or numbness, stop doing the maneuver and call your health care provider. How to perform the Epley maneuver 1. Sit on the edge of a bed or table with your back straight and your legs extended or hanging over the edge of the bed or table. 2. Turn your head halfway toward the affected ear or side. 3. Lie backward quickly with your head turned until you are lying flat on your back. You may want to position a pillow under your shoulders. 4. Hold this position for 30 seconds. You may experience an attack of vertigo. This is normal. 5. Turn your head to the opposite direction until your unaffected ear is facing the floor. 6. Hold this position for 30 seconds. You may experience an attack of vertigo. This is normal. Hold this position until the vertigo stops. 7. Turn your whole body to the same side as your head. Hold for another 30 seconds. 8. Sit back up. You can repeat this exercise up to 3 times a day. Follow these instructions at home:  After doing the Epley maneuver, you can return to your normal activities.  Ask your health care provider if there is anything you should do at home to prevent vertigo. He or she may recommend that you: ? Keep your head raised (elevated) with two or more pillows while you sleep. ? Do not sleep on the side of your affected ear. ? Get up slowly from bed. ? Avoid sudden movements during the day. ? Avoid extreme head movement, like looking up or bending over. Contact a health care provider if:  Your vertigo gets worse.  You have other symptoms, including: ? Nausea. ? Vomiting. ? Headache. Get help right away if:  You have vision changes.  You have a severe or worsening headache or neck pain.  You cannot stop vomiting.  You have new numbness or weakness in any part of your body. Summary  Vertigo is the feeling that you or your surroundings are moving when they are not.  The Epley maneuver is an exercise that relieves  symptoms of vertigo.  If the Epley maneuver is done correctly, it is considered safe. You can do it up to 3 times a day. This information is not intended to replace advice given to you by your health care provider. Make sure you discuss any questions you have with your health care provider. Document Revised: 05/24/2017 Document Reviewed: 05/01/2016 Elsevier Patient Education  2020 Elsevier Inc.  

## 2020-06-08 ENCOUNTER — Other Ambulatory Visit: Payer: Self-pay | Admitting: Family Medicine

## 2020-06-08 NOTE — Telephone Encounter (Signed)
Requested medication (s) are due for refill today: due 1/22  Requested medication (s) are on the active medication list:yes  Last refill: 01/27/20  4 caps.  5 refills  Future visit scheduled  no  Notes to clinic: not delegated  Requested Prescriptions  Pending Prescriptions Disp Refills   Vitamin D, Ergocalciferol, (DRISDOL) 1.25 MG (50000 UNIT) CAPS capsule [Pharmacy Med Name: VITAMIN D2 1.25MG (50,000 UNIT)] 12 capsule 1    Sig: TAKE 1 CAPSULE (50,000 UNITS TOTAL) BY MOUTH EVERY 7 (SEVEN) DAYS.      Endocrinology:  Vitamins - Vitamin D Supplementation Failed - 06/08/2020  1:08 AM      Failed - 50,000 IU strengths are not delegated      Failed - Phosphate in normal range and within 360 days    No results found for: PHOS        Failed - Vitamin D in normal range and within 360 days    Vit D, 25-Hydroxy  Date Value Ref Range Status  07/23/2019 16.3 (L) 30.0 - 100.0 ng/mL Final    Comment:    Vitamin D deficiency has been defined by the Institute of Medicine and an Endocrine Society practice guideline as a level of serum 25-OH vitamin D less than 20 ng/mL (1,2). The Endocrine Society went on to further define vitamin D insufficiency as a level between 21 and 29 ng/mL (2). 1. IOM (Institute of Medicine). 2010. Dietary reference    intakes for calcium and D. [place]: The    [employer]. 2. Holick MF, Binkley Alpine, Bischoff-Ferrari HA, et al.    Evaluation, treatment, and prevention of vitamin D    deficiency: an Endocrine Society clinical practice    guideline. JCEM. 2011 Jul; 96(7):1911-30.           Passed - Ca in normal range and within 360 days    Calcium  Date Value Ref Range Status  07/23/2019 9.7 8.7 - 10.2 mg/dL Final          Passed - Valid encounter within last 12 months    Recent Outpatient Visits           3 weeks ago Vertigo   [facility] [place], Megan P, DO   4 months ago Anxiety and depression   [facility]  [place], Megan P, DO   5 months ago Upper respiratory tract infection, unspecified type   [facility], Megan P, DO   10 months ago Upper respiratory tract infection, unspecified type   [facility], Megan P, DO   10 months ago Anxiety and depression   [facility], [place], DO

## 2020-08-10 ENCOUNTER — Other Ambulatory Visit: Payer: Self-pay | Admitting: Family Medicine

## 2020-08-10 NOTE — Telephone Encounter (Signed)
   Notes to clinic: requesting a 90 day supply   Requested Prescriptions  Pending Prescriptions Disp Refills   QUEtiapine (SEROQUEL XR) 50 MG TB24 24 hr tablet [Pharmacy Med Name: QUETIAPINE ER 50 MG TABLET] 180 tablet 2    Sig: TAKE 1-2 TABLETS (50-100 MG TOTAL) BY MOUTH AT BEDTIME      Not Delegated - Psychiatry:  Antipsychotics - Second Generation (Atypical) - quetiapine Failed - 08/10/2020 12:31 PM      Failed - This refill cannot be delegated      Failed - ALT in normal range and within 180 days    ALT  Date Value Ref Range Status  07/23/2019 41 (H) 0 - 32 IU/L Final          Failed - AST in normal range and within 180 days    AST  Date Value Ref Range Status  07/23/2019 25 0 - 40 IU/L Final          Passed - Completed PHQ-2 or PHQ-9 in the last 360 days      Passed - Last BP in normal range    BP Readings from Last 1 Encounters:  05/13/20 122/77          Passed - Valid encounter within last 6 months    Recent Outpatient Visits           2 months ago Vertigo   [facility] [place], Megan P, DO   7 months ago Anxiety and depression   [facility] [place], Megan P, DO   8 months ago Upper respiratory tract infection, unspecified type   [facility] [place], Megan P, DO   1 year ago Upper respiratory tract infection, unspecified type   [facility] [place], Megan P, DO   1 year ago Anxiety and depression   [facility] [place], [place], DO

## 2020-08-11 NOTE — Telephone Encounter (Signed)
Patient needs appointment before refill

## 2020-08-11 NOTE — Telephone Encounter (Signed)
Lvm to make this apt. 

## 2020-08-15 ENCOUNTER — Other Ambulatory Visit: Payer: Self-pay | Admitting: Family Medicine

## 2020-08-15 NOTE — Telephone Encounter (Signed)
Lvm to make this apt. 

## 2020-08-15 NOTE — Telephone Encounter (Signed)
Requested medication (s) are due for refill today - yes  Requested medication (s) are on the active medication list - yes  Future visit scheduled -no  Last refill: 05/09/20  Notes to clinic: Request from pharmacy for 90 day supply of medication- patient may be due follow up appointment- passes protocol for visits- but no medication follow up  Requested Prescriptions  Pending Prescriptions Disp Refills   venlafaxine XR (EFFEXOR-XR) 75 MG 24 hr capsule [Pharmacy Med Name: VENLAFAXINE HCL ER 75 MG CAP] 270 capsule 1    Sig: TAKE 1 CAPSULE BY MOUTH DAILY FOR 2 WEEKS THEN 2 CAPSULES DAILY      Psychiatry: Antidepressants - SNRI - desvenlafaxine & venlafaxine Failed - 08/15/2020 12:31 PM      Failed - LDL in normal range and within 360 days    LDL Chol Calc (NIH)  Date Value Ref Range Status  07/23/2019 72 0 - 99 mg/dL Final          Failed - Total Cholesterol in normal range and within 360 days    Cholesterol, Total  Date Value Ref Range Status  07/23/2019 169 100 - 199 mg/dL Final          Failed - Triglycerides in normal range and within 360 days    Triglycerides  Date Value Ref Range Status  07/23/2019 302 (H) 0 - 149 mg/dL Final          Passed - Completed PHQ-2 or PHQ-9 in the last 360 days      Passed - Last BP in normal range    BP Readings from Last 1 Encounters:  05/13/20 122/77          Passed - Valid encounter within last 6 months    Recent Outpatient Visits           3 months ago Vertigo   [facility] [place], Megan P, DO   7 months ago Anxiety and depression   [facility] [place], Megan P, DO   8 months ago Upper respiratory tract infection, unspecified type   [facility] [place], Megan P, DO   1 year ago Upper respiratory tract infection, unspecified type   [facility] [place], Megan P, DO   1 year ago Anxiety and depression   [facility] [place], Megan P, DO                     Requested Prescriptions  Pending Prescriptions Disp Refills   venlafaxine XR (EFFEXOR-XR) 75 MG 24 hr capsule [Pharmacy Med Name: VENLAFAXINE HCL ER 75 MG CAP] 270 capsule 1    Sig: TAKE 1 CAPSULE BY MOUTH DAILY FOR 2 WEEKS THEN 2 CAPSULES DAILY      Psychiatry: Antidepressants - SNRI - desvenlafaxine & venlafaxine Failed - 08/15/2020 12:31 PM      Failed - LDL in normal range and within 360 days    LDL Chol Calc (NIH)  Date Value Ref Range Status  07/23/2019 72 0 - 99 mg/dL Final          Failed - Total Cholesterol in normal range and within 360 days    Cholesterol, Total  Date Value Ref Range Status  07/23/2019 169 100 - 199 mg/dL Final          Failed - Triglycerides in normal range and within 360 days    Triglycerides  Date Value Ref Range Status  07/23/2019 302 (H) 0 - 149 mg/dL Final  Passed - Completed PHQ-2 or PHQ-9 in the last 360 days      Passed - Last BP in normal range    BP Readings from Last 1 Encounters:  05/13/20 122/77          Passed - Valid encounter within last 6 months    Recent Outpatient Visits           3 months ago Vertigo   [facility] [place], Megan P, DO   7 months ago Anxiety and depression   [facility] [place], Megan P, DO   8 months ago Upper respiratory tract infection, unspecified type   [facility] [place], Megan P, DO   1 year ago Upper respiratory tract infection, unspecified type   [facility] [place], Megan P, DO   1 year ago Anxiety and depression   [facility] [place], [place], DO

## 2020-08-16 NOTE — Telephone Encounter (Signed)
PT SCHEDULED FOR APT.

## 2020-08-23 ENCOUNTER — Ambulatory Visit: Payer: PRIVATE HEALTH INSURANCE | Admitting: Family Medicine

## 2020-08-26 ENCOUNTER — Other Ambulatory Visit: Payer: Self-pay

## 2020-08-26 ENCOUNTER — Encounter: Payer: Self-pay | Admitting: Family Medicine

## 2020-08-26 ENCOUNTER — Ambulatory Visit (INDEPENDENT_AMBULATORY_CARE_PROVIDER_SITE_OTHER): Payer: Self-pay | Admitting: Family Medicine

## 2020-08-26 VITALS — BP 116/76 | HR 103 | Temp 98.2°F | Wt 233.6 lb

## 2020-08-26 DIAGNOSIS — F419 Anxiety disorder, unspecified: Secondary | ICD-10-CM

## 2020-08-26 DIAGNOSIS — F32A Depression, unspecified: Secondary | ICD-10-CM

## 2020-08-26 MED ORDER — QUETIAPINE FUMARATE ER 50 MG PO TB24: 50.0000 mg | ORAL_TABLET | Freq: Every day | ORAL | 2 refills | Status: DC

## 2020-08-26 MED ORDER — VENLAFAXINE HCL ER 75 MG PO CP24: ORAL_CAPSULE | ORAL | 2 refills | Status: DC

## 2020-08-26 MED ORDER — NAPROXEN 500 MG PO TABS: 500.0000 mg | ORAL_TABLET | Freq: Two times a day (BID) | ORAL | 3 refills | Status: DC

## 2020-08-26 NOTE — Assessment & Plan Note (Signed)
Has been off her meds. Will restart and recheck in 1 month. Call with any concerns. Continue to monitor.

## 2020-08-26 NOTE — Progress Notes (Signed)
BP 116/76   Pulse (!) 103   Temp 98.2 F (36.8 C)   Wt 233 lb 9.6 oz (106 kg)   SpO2 97%   BMI 43.48 kg/m    Subjective:    Patient ID: Amber [name], female    DOB: 12-Jul-1980, 40 y.o.   MRN: [id number]  HPI: Amber [name] is a 40 y.o. female  Chief Complaint  Patient presents with  . Depression  . Anxiety   ANXIETY/DEPRESSION- has been off her medicine a few weeks due to cost Duration: chronic Status:uncontrolled Anxious mood: yes  Excessive worrying: yes Irritability: yes  Sweating: no Nausea: no Palpitations:no Hyperventilation: no Panic attacks: no Agoraphobia: yes  Obscessions/compulsions: no Depressed mood: yes Depression screen [facility] 2/9 08/26/2020 01/11/2020 12/14/2019 08/10/2019 07/23/2019  Decreased Interest 1 2 2 2 2   Down, Depressed, Hopeless 1 2 0 2 2  PHQ - 2 Score 2 4 2 4 4   Altered sleeping 3 2 1 2 3   Tired, decreased energy 1 2 2 2 2   Change in appetite 0 2 1 3 1   Feeling bad or failure about yourself  1 2 2 3 2   Trouble concentrating 1 1 2 2 1   Moving slowly or fidgety/restless 0 1 0 0 1  Suicidal thoughts 0 1 0 1 1  PHQ-9 Score 8 15 10 17 15   Difficult doing work/chores Not difficult at all Somewhat difficult Somewhat difficult Somewhat difficult Very difficult  Some recent data might be hidden   Anhedonia: no Weight changes: no Insomnia: no   Hypersomnia: no Fatigue/loss of energy: yes Feelings of worthlessness: yes Feelings of guilt: yes Impaired concentration/indecisiveness: yes Suicidal ideations: no  Crying spells: no Recent Stressors/Life Changes: no   Relationship problems: no   Family stress: no     Financial stress: no    Job stress: no    Recent death/loss: no  Relevant past medical, surgical, family and social history reviewed and updated as indicated. Interim medical history since our last visit reviewed. Allergies and medications reviewed and updated.  Review of Systems  Constitutional: Negative.   Respiratory:  Negative.   Cardiovascular: Negative.   Gastrointestinal: Negative.   Musculoskeletal: Positive for arthralgias. Negative for back pain, gait problem, joint swelling, myalgias, neck pain and neck stiffness.  Skin: Negative.   Neurological: Negative.   Psychiatric/Behavioral: Positive for dysphoric mood. Negative for agitation, behavioral problems, confusion, decreased concentration, hallucinations, self-injury, sleep disturbance and suicidal ideas. The patient is nervous/anxious. The patient is not hyperactive.     Per HPI unless specifically indicated above     Objective:    BP 116/76   Pulse (!) 103   Temp 98.2 F (36.8 C)   Wt 233 lb 9.6 oz (106 kg)   SpO2 97%   BMI 43.48 kg/m   Wt Readings from Last 3 Encounters:  08/26/20 233 lb 9.6 oz (106 kg)  05/13/20 239 lb (108.4 kg)  01/11/20 229 lb 3.2 oz (104 kg)    Physical Exam Vitals and nursing note reviewed.  Constitutional:      General: She is not in acute distress.    Appearance: Normal appearance. She is not ill-appearing, toxic-appearing or diaphoretic.  HENT:     Head: Normocephalic and atraumatic.     Right Ear: External ear normal.     Left Ear: External ear normal.     Nose: Nose normal.     Mouth/Throat:     Mouth: Mucous membranes are moist.  Pharynx: Oropharynx is clear.  Eyes:     General: No scleral icterus.       Right eye: No discharge.        Left eye: No discharge.     Extraocular Movements: Extraocular movements intact.     Conjunctiva/sclera: Conjunctivae normal.     Pupils: Pupils are equal, round, and reactive to light.  Cardiovascular:     Rate and Rhythm: Normal rate and regular rhythm.     Pulses: Normal pulses.     Heart sounds: Normal heart sounds. No murmur heard. No friction rub. No gallop.   Pulmonary:     Effort: Pulmonary effort is normal. No respiratory distress.     Breath sounds: Normal breath sounds. No stridor. No wheezing, rhonchi or rales.  Chest:     Chest wall: No  tenderness.  Musculoskeletal:        General: Normal range of motion.     Cervical back: Normal range of motion and neck supple.  Skin:    General: Skin is warm and dry.     Capillary Refill: Capillary refill takes less than 2 seconds.     Coloration: Skin is not jaundiced or pale.     Findings: No bruising, erythema, lesion or rash.  Neurological:     General: No focal deficit present.     Mental Status: She is alert and oriented to person, place, and time. Mental status is at baseline.  Psychiatric:        Mood and Affect: Mood normal.        Behavior: Behavior normal.        Thought Content: Thought content normal.        Judgment: Judgment normal.     Results for orders placed or performed in visit on 07/23/19  Microscopic Examination   URINE  Result Value Ref Range   WBC, UA None seen 0 - 5 /hpf   RBC 3-10 (A) 0 - 2 /hpf   Epithelial Cells (non renal) 0-10 0 - 10 /hpf   Bacteria, UA None seen None seen/Few  Comprehensive metabolic panel  Result Value Ref Range   Glucose 92 65 - 99 mg/dL   BUN 13 6 - 24 mg/dL   Creatinine, Ser 6.38 0.57 - 1.00 mg/dL   GFR calc non Af Amer 72 >59 mL/min/1.73   GFR calc Af Amer 82 >59 mL/min/1.73   BUN/Creatinine Ratio 13 9 - 23   Sodium 138 134 - 144 mmol/L   Potassium 4.1 3.5 - 5.2 mmol/L   Chloride 99 96 - 106 mmol/L   CO2 23 20 - 29 mmol/L   Calcium 9.7 8.7 - 10.2 mg/dL   Total Protein 7.4 6.0 - 8.5 g/dL   Albumin 5.0 (H) 3.8 - 4.8 g/dL   Globulin, Total 2.4 1.5 - 4.5 g/dL   Albumin/Globulin Ratio 2.1 1.2 - 2.2   Bilirubin Total 0.4 0.0 - 1.2 mg/dL   Alkaline Phosphatase 59 39 - 117 IU/L   AST 25 0 - 40 IU/L   ALT 41 (H) 0 - 32 IU/L  Lipid Panel w/o Chol/HDL Ratio  Result Value Ref Range   Cholesterol, Total 169 100 - 199 mg/dL   Triglycerides 937 (H) 0 - 149 mg/dL   HDL 49 >34 mg/dL   VLDL Cholesterol Cal 48 (H) 5 - 40 mg/dL   LDL Chol Calc (NIH) 72 0 - 99 mg/dL  TSH  Result Value Ref Range   TSH 1.090 0.450 - 4.500  uIU/mL  UA/M w/rflx Culture, Routine   Specimen: Urine   URINE  Result Value Ref Range   Specific Gravity, UA 1.015 1.005 - 1.030   pH, UA 5.5 5.0 - 7.5   Color, UA Yellow Yellow   Appearance Ur Clear Clear   Leukocytes,UA Negative Negative   Protein,UA Negative Negative/Trace   Glucose, UA Negative Negative   Ketones, UA Negative Negative   RBC, UA 3+ (A) Negative   Bilirubin, UA Negative Negative   Urobilinogen, Ur 0.2 0.2 - 1.0 mg/dL   Nitrite, UA Negative Negative   Microscopic Examination See below:   VITAMIN D 25 Hydroxy (Vit-D Deficiency, Fractures)  Result Value Ref Range   Vit D, 25-Hydroxy 16.3 (L) 30.0 - 100.0 ng/mL  Bayer DCA Hb A1c Waived  Result Value Ref Range   HB A1C (BAYER DCA - WAIVED) 5.0 <7.0 %  LH  Result Value Ref Range   LH 4.9 mIU/mL  FSH  Result Value Ref Range   FSH 8.3 mIU/mL  Estradiol  Result Value Ref Range   Estradiol 29.8 pg/mL  Testosterone, free, total([facility]/Sunquest)  Result Value Ref Range   Testosterone 12 8 - 48 ng/dL   Testosterone, Free 0.6 0.0 - 4.2 pg/mL   Sex Hormone Binding 74.4 24.6 - 122.0 nmol/L  Prolactin  Result Value Ref Range   Prolactin 15.9 4.8 - 23.3 ng/mL      Assessment & Plan:   Problem List Items Addressed This Visit      Other   Anxiety and depression - Primary    Has been off her meds. Will restart and recheck in 1 month. Call with any concerns. Continue to monitor.       Relevant Medications   venlafaxine XR (EFFEXOR-XR) 75 MG 24 hr capsule       Follow up plan: Return in about 4 weeks (around 09/23/2020).

## 2020-09-26 ENCOUNTER — Ambulatory Visit: Payer: PRIVATE HEALTH INSURANCE | Admitting: Family Medicine

## 2021-03-17 ENCOUNTER — Other Ambulatory Visit: Payer: Self-pay

## 2021-06-21 ENCOUNTER — Ambulatory Visit: Payer: Self-pay

## 2021-06-21 NOTE — Telephone Encounter (Signed)
°  Chief Complaint: requesting medication on going sore throat and cough since having covid 06/05/21 Symptoms: sore throat , dry cough, shortness of breath with exertion Frequency: since 06/05/21 Pertinent Negatives: Patient denies fever, difficulty breathing Disposition: [] ED /[] Urgent Care (no appt availability in office) / [x] Appointment(In office/virtual)/ []  [facility]/ [] Home Care/ [] Refused Recommended Disposition  Additional Notes:  Requesting any medications to be sent to . Appt My Chart VV 06/23/21.      Reason for Disposition  [1] Sore throat with cough/cold symptoms AND [2] present > 5 days  Answer Assessment - Initial Assessment Questions 1. ONSET: "When did the throat start hurting?" (Hours or days ago)      Prior to 06/05/21 2. SEVERITY: "How bad is the sore throat?" (Scale 1-10; mild, moderate or severe)   - MILD (1-3):  doesn't interfere with eating or normal activities   - MODERATE (4-7): interferes with eating some solids and normal activities   - SEVERE (8-10):  excruciating pain, interferes with most normal activities   - SEVERE DYSPHAGIA: can't swallow liquids, drooling     Hard to talk, hoarse, fatigue from coughing  3. STREP EXPOSURE: "Has there been any exposure to strep within the past week?" If Yes, ask: "What type of contact occurred?"      na 4.  VIRAL SYMPTOMS: "Are there any symptoms of a cold, such as a runny nose, cough, hoarse voice or red eyes?"      Covid positive 06/05/21 5. FEVER: "Do you have a fever?" If Yes, ask: "What is your temperature, how was it measured, and when did it start?"     no 6. PUS ON THE TONSILS: "Is there pus on the tonsils in the back of your throat?"     na 7. OTHER SYMPTOMS: "Do you have any other symptoms?" (e.g., difficulty breathing, headache, rash)     Shortness of breath with exertion post covid  8. PREGNANCY: "Is there any chance you are pregnant?" "When was your last menstrual period?"      na  Protocols used: Sore Throat-A-AH

## 2021-06-21 NOTE — Telephone Encounter (Signed)
Summary: sore throat / rx request   The patient tested positive for COVID the week of 06/05/21   The patient has continued to experience a sore throat and cough   The patient would like to be prescribed something to help with their discomfort   Please contact further when possible     Left message to call back about symptoms.

## 2021-06-23 ENCOUNTER — Telehealth (INDEPENDENT_AMBULATORY_CARE_PROVIDER_SITE_OTHER): Payer: Self-pay | Admitting: Family Medicine

## 2021-06-23 ENCOUNTER — Encounter: Payer: Self-pay | Admitting: Family Medicine

## 2021-06-23 DIAGNOSIS — U071 COVID-19: Secondary | ICD-10-CM

## 2021-06-23 DIAGNOSIS — J1282 Pneumonia due to coronavirus disease 2019: Secondary | ICD-10-CM

## 2021-06-23 MED ORDER — PREDNISONE 50 MG PO TABS: 50.0000 mg | ORAL_TABLET | Freq: Every day | ORAL | 0 refills | Status: DC

## 2021-06-23 MED ORDER — BENZONATATE 200 MG PO CAPS: 200.0000 mg | ORAL_CAPSULE | Freq: Two times a day (BID) | ORAL | 0 refills | Status: DC | PRN

## 2021-06-23 MED ORDER — DOXYCYCLINE HYCLATE 100 MG PO TABS: 100.0000 mg | ORAL_TABLET | Freq: Two times a day (BID) | ORAL | 0 refills | Status: DC

## 2021-06-23 MED ORDER — HYDROCOD POLST-CPM POLST ER 10-8 MG/5ML PO SUER: 5.0000 mL | Freq: Two times a day (BID) | ORAL | 0 refills | Status: DC | PRN

## 2021-06-23 NOTE — Progress Notes (Signed)
There were no vitals taken for this visit.   Subjective:    Patient ID: Amber [name], female    DOB: 27-Dec-1980, 40 y.o.   MRN: [id number]  HPI: Amber [name] is a 40 y.o. female  Chief Complaint  Patient presents with   URI    Pt states she has been feeling sick since 05/29/21. States she tested positive for covid on 06/05/21. States she is still having a cough and sore throat.    UPPER RESPIRATORY TRACT INFECTION Duration: 3 weeks, tested positive for COVID on 06/05/21 Worst symptom: Fever: yes Cough: yes Shortness of breath: yes Wheezing: yes Chest pain: no Chest tightness: yes Chest congestion: yes Nasal congestion: yes Runny nose: yes Post nasal drip: yes Sneezing: no Sore throat: yes Swollen glands: no Sinus pressure: yes Headache: yes Face pain: no Toothache: no Ear pain: no  Ear pressure: no  Eyes red/itching:no Eye drainage/crusting: no  Vomiting: no Rash: no Fatigue: yes Sick contacts: yes Strep contacts: no  Context: worse Recurrent sinusitis: no Relief with OTC cold/cough medications: no  Treatments attempted: none   Relevant past medical, surgical, family and social history reviewed and updated as indicated. Interim medical history since our last visit reviewed. Allergies and medications reviewed and updated.  Review of Systems  Constitutional:  Positive for chills, diaphoresis, fatigue and fever. Negative for activity change, appetite change and unexpected weight change.  HENT:  Positive for congestion, postnasal drip, rhinorrhea and sinus pressure. Negative for dental problem, drooling, ear discharge, ear pain, facial swelling, hearing loss, mouth sores, nosebleeds, sinus pain, sneezing, sore throat, tinnitus, trouble swallowing and voice change.   Eyes: Negative.   Respiratory:  Positive for cough, chest tightness, shortness of breath and wheezing. Negative for apnea, choking and stridor.   Cardiovascular: Negative.   Gastrointestinal:  Negative.   Genitourinary: Negative.   Musculoskeletal: Negative.   Skin: Negative.   Psychiatric/Behavioral: Negative.     Per HPI unless specifically indicated above     Objective:    There were no vitals taken for this visit.  Wt Readings from Last 3 Encounters:  08/26/20 233 lb 9.6 oz (106 kg)  05/13/20 239 lb (108.4 kg)  01/11/20 229 lb 3.2 oz (104 kg)    Physical Exam Vitals and nursing note reviewed.  Constitutional:      General: She is not in acute distress.    Appearance: Normal appearance. She is not ill-appearing, toxic-appearing or diaphoretic.  HENT:     Head: Normocephalic and atraumatic.     Right Ear: External ear normal.     Left Ear: External ear normal.     Nose: Nose normal.     Mouth/Throat:     Mouth: Mucous membranes are moist.     Pharynx: Oropharynx is clear.  Eyes:     General: No scleral icterus.       Right eye: No discharge.        Left eye: No discharge.     Conjunctiva/sclera: Conjunctivae normal.     Pupils: Pupils are equal, round, and reactive to light.  Pulmonary:     Effort: Pulmonary effort is normal. No respiratory distress.     Comments: Speaking in full sentences Musculoskeletal:        General: Normal range of motion.     Cervical back: Normal range of motion.  Skin:    Coloration: Skin is not jaundiced or pale.     Findings: No bruising, erythema, lesion or rash.  Neurological:     Mental Status: She is alert and oriented to person, place, and time. Mental status is at baseline.  Psychiatric:        Mood and Affect: Mood normal.        Behavior: Behavior normal.        Thought Content: Thought content normal.        Judgment: Judgment normal.    Results for orders placed or performed in visit on 07/23/19  Microscopic Examination   URINE  Result Value Ref Range   WBC, UA None seen 0 - 5 /hpf   RBC 3-10 (A) 0 - 2 /hpf   Epithelial Cells (non renal) 0-10 0 - 10 /hpf   Bacteria, UA None seen None seen/Few   Comprehensive metabolic panel  Result Value Ref Range   Glucose 92 65 - 99 mg/dL   BUN 13 6 - 24 mg/dL   Creatinine, Ser 0.62 0.57 - 1.00 mg/dL   GFR calc non Af Amer 72 >59 mL/min/1.73   GFR calc Af Amer 82 >59 mL/min/1.73   BUN/Creatinine Ratio 13 9 - 23   Sodium 138 134 - 144 mmol/L   Potassium 4.1 3.5 - 5.2 mmol/L   Chloride 99 96 - 106 mmol/L   CO2 23 20 - 29 mmol/L   Calcium 9.7 8.7 - 10.2 mg/dL   Total Protein 7.4 6.0 - 8.5 g/dL   Albumin 5.0 (H) 3.8 - 4.8 g/dL   Globulin, Total 2.4 1.5 - 4.5 g/dL   Albumin/Globulin Ratio 2.1 1.2 - 2.2   Bilirubin Total 0.4 0.0 - 1.2 mg/dL   Alkaline Phosphatase 59 39 - 117 IU/L   AST 25 0 - 40 IU/L   ALT 41 (H) 0 - 32 IU/L  Lipid Panel w/o Chol/HDL Ratio  Result Value Ref Range   Cholesterol, Total 169 100 - 199 mg/dL   Triglycerides 694 (H) 0 - 149 mg/dL   HDL 49 >85 mg/dL   VLDL Cholesterol Cal 48 (H) 5 - 40 mg/dL   LDL Chol Calc (NIH) 72 0 - 99 mg/dL  TSH  Result Value Ref Range   TSH 1.090 0.450 - 4.500 uIU/mL  UA/M w/rflx Culture, Routine   Specimen: Urine   URINE  Result Value Ref Range   Specific Gravity, UA 1.015 1.005 - 1.030   pH, UA 5.5 5.0 - 7.5   Color, UA Yellow Yellow   Appearance Ur Clear Clear   Leukocytes,UA Negative Negative   Protein,UA Negative Negative/Trace   Glucose, UA Negative Negative   Ketones, UA Negative Negative   RBC, UA 3+ (A) Negative   Bilirubin, UA Negative Negative   Urobilinogen, Ur 0.2 0.2 - 1.0 mg/dL   Nitrite, UA Negative Negative   Microscopic Examination See below:   VITAMIN D 25 Hydroxy (Vit-D Deficiency, Fractures)  Result Value Ref Range   Vit D, 25-Hydroxy 16.3 (L) 30.0 - 100.0 ng/mL  Bayer DCA Hb A1c Waived  Result Value Ref Range   HB A1C (BAYER DCA - WAIVED) 5.0 <7.0 %  LH  Result Value Ref Range   LH 4.9 mIU/mL  FSH  Result Value Ref Range   FSH 8.3 mIU/mL  Estradiol  Result Value Ref Range   Estradiol 29.8 pg/mL  Testosterone, free, total([facility])   Result Value Ref Range   Testosterone 12 8 - 48 ng/dL   Testosterone, Free 0.6 0.0 - 4.2 pg/mL   Sex Hormone Binding 74.4 24.6 - 122.0 nmol/L  Prolactin  Result  Value Ref Range   Prolactin 15.9 4.8 - 23.3 ng/mL      Assessment & Plan:   Problem List Items Addressed This Visit   None Visit Diagnoses     Pneumonia due to COVID-19 virus    -  Primary   Will treat with doxycycline, tussionex, tessalon and prednisone. Call if not getting better or getting worse. Continue to monitor.    Relevant Medications   doxycycline (VIBRA-TABS) 100 MG tablet   chlorpheniramine-HYDROcodone (TUSSIONEX PENNKINETIC ER) 10-8 MG/5ML SUER   benzonatate (TESSALON) 200 MG capsule        Follow up plan: Return if symptoms worsen or fail to improve.   This visit was completed via video visit through MyChart due to the restrictions of the COVID-19 pandemic. All issues as above were discussed and addressed. Physical exam was done as above through visual confirmation on video through MyChart. If it was felt that the patient should be evaluated in the office, they were directed there. The patient verbally consented to this visit. Location of the patient: home Location of the provider: work Those involved with this call:  Provider: Olevia Perches, DO CMA: Rolley Sims, CMA Front Desk/Registration: [employer]  Time spent on call:  15 minutes with patient face to face via video conference. More than 50% of this time was spent in counseling and coordination of care. 23 minutes total spent in review of patient's record and preparation of their chart.

## 2021-10-02 ENCOUNTER — Ambulatory Visit: Payer: 59 | Admitting: Family Medicine

## 2021-10-02 ENCOUNTER — Encounter: Payer: Self-pay | Admitting: Family Medicine

## 2021-10-02 VITALS — BP 115/68 | HR 81 | Temp 98.4°F | Wt 233.4 lb

## 2021-10-02 DIAGNOSIS — R112 Nausea with vomiting, unspecified: Secondary | ICD-10-CM

## 2021-10-02 DIAGNOSIS — K219 Gastro-esophageal reflux disease without esophagitis: Secondary | ICD-10-CM | POA: Diagnosis not present

## 2021-10-02 DIAGNOSIS — F32A Depression, unspecified: Secondary | ICD-10-CM

## 2021-10-02 DIAGNOSIS — F419 Anxiety disorder, unspecified: Secondary | ICD-10-CM | POA: Diagnosis not present

## 2021-10-02 DIAGNOSIS — Z903 Acquired absence of stomach [part of]: Secondary | ICD-10-CM

## 2021-10-02 DIAGNOSIS — Z1159 Encounter for screening for other viral diseases: Secondary | ICD-10-CM

## 2021-10-02 DIAGNOSIS — Z13220 Encounter for screening for lipoid disorders: Secondary | ICD-10-CM

## 2021-10-02 LAB — URINALYSIS, ROUTINE W REFLEX MICROSCOPIC
Bilirubin, UA: NEGATIVE
Glucose, UA: NEGATIVE
Ketones, UA: NEGATIVE
Leukocytes,UA: NEGATIVE
Nitrite, UA: NEGATIVE
Protein,UA: NEGATIVE
Specific Gravity, UA: 1.015 (ref 1.005–1.030)
Urobilinogen, Ur: 1 mg/dL (ref 0.2–1.0)
pH, UA: 6.5 (ref 5.0–7.5)

## 2021-10-02 LAB — MICROSCOPIC EXAMINATION
Bacteria, UA: NONE SEEN
WBC, UA: NONE SEEN /hpf (ref 0–5)

## 2021-10-02 MED ORDER — OMEPRAZOLE 20 MG PO CPDR: 20.0000 mg | DELAYED_RELEASE_CAPSULE | Freq: Every day | ORAL | 3 refills | Status: DC

## 2021-10-02 MED ORDER — QUETIAPINE FUMARATE ER 50 MG PO TB24: 50.0000 mg | ORAL_TABLET | Freq: Every day | ORAL | 1 refills | Status: DC

## 2021-10-02 MED ORDER — VENLAFAXINE HCL ER 75 MG PO CP24: ORAL_CAPSULE | ORAL | 2 refills | Status: DC

## 2021-10-02 NOTE — Assessment & Plan Note (Signed)
Not doing well with vomiting x several months. Will start omeprazole, but likely needs EGD. Referral to GI placed today.  ?

## 2021-10-02 NOTE — Progress Notes (Signed)
? ?BP 115/68   Pulse 81   Temp 98.4 ?F (36.9 ?C)   Wt 233 lb 6.4 oz (105.9 kg)   LMP 10/02/2021 (Exact Date)   SpO2 99%   BMI 43.45 kg/m?   ? ?Subjective:  ? ? Patient ID: Amber [name], female    DOB: 1980/12/13, 41 y.o.   MRN: [id number] ? ?HPI: ?Amber [name] is a 41 y.o. female ? ?Chief Complaint  ?Patient presents with  ? Gastroesophageal Reflux  ?  Patient states she has been vomiting after meals, happening frequently. Patient states she is having heart burn and reflux.   ? ?GERD ?Duration: few months ?GERD control status: worse ?Satisfied with current treatment? Not on anything ?Heartburn frequency: all the time ?Medication side effects: no  ?Medication compliance: not on anything ?Previous GERD medications: prilosec ?Dysphagia: no ?Odynophagia:  no ?Hematemesis: no ?Blood in stool: no ?EGD: no ? ?ANXIETY/DEPRESSION ?Duration: chronic ?Status:uncontrolled ?Anxious mood: yes  ?Excessive worrying: no ?Irritability: no  ?Sweating: no ?Nausea: no ?Palpitations:no ?Hyperventilation: no ?Panic attacks: no ?Agoraphobia: no  ?Obscessions/compulsions: no ?Depressed mood: yes ? ?  10/02/2021  ?  4:12 PM 08/26/2020  ?  3:08 PM 01/11/2020  ? 11:10 AM 12/14/2019  ?  2:42 PM 08/10/2019  ? 12:52 PM  ?Depression screen PHQ 2/9  ?Decreased Interest 1 1 2 2 2   ?Down, Depressed, Hopeless 2 1 2  0 2  ?PHQ - 2 Score 3 2 4 2 4   ?Altered sleeping 2 3 2 1 2   ?Tired, decreased energy 2 1 2 2 2   ?Change in appetite 2 0 2 1 3   ?Feeling bad or failure about yourself  2 1 2 2 3   ?Trouble concentrating 2 1 1 2 2   ?Moving slowly or fidgety/restless 0 0 1 0 0  ?Suicidal thoughts 1 0 1 0 1  ?PHQ-9 Score 14 8 15 10 17   ?Difficult doing work/chores  Not difficult at all Somewhat difficult Somewhat difficult Somewhat difficult  ? ?Anhedonia: no ?Weight changes: no ?Insomnia: yes hard to fall asleep  ?Hypersomnia: no ?Fatigue/loss of energy: yes ?Feelings of worthlessness: no ?Feelings of guilt: no ?Impaired concentration/indecisiveness:  no ?Suicidal ideations: no  ?Crying spells: no ?Recent Stressors/Life Changes: no ?  Relationship problems: no ?  Family stress: no   ?  Financial stress: no  ?  Job stress: yes  ?  Recent death/loss: no ? ? ?Relevant past medical, surgical, family and social history reviewed and updated as indicated. Interim medical history since our last visit reviewed. ?Allergies and medications reviewed and updated. ? ?Review of Systems ? ?Per HPI unless specifically indicated above ? ?   ?Objective:  ?  ?BP 115/68   Pulse 81   Temp 98.4 ?F (36.9 ?C)   Wt 233 lb 6.4 oz (105.9 kg)   LMP 10/02/2021 (Exact Date)   SpO2 99%   BMI 43.45 kg/m?   ?Wt Readings from Last 3 Encounters:  ?10/02/21 233 lb 6.4 oz (105.9 kg)  ?08/26/20 233 lb 9.6 oz (106 kg)  ?05/13/20 239 lb (108.4 kg)  ?  ?Physical Exam ?Vitals and nursing note reviewed.  ?Constitutional:   ?   General: She is not in acute distress. ?   Appearance: Normal appearance. She is obese. She is not ill-appearing, toxic-appearing or diaphoretic.  ?HENT:  ?   Head: Normocephalic and atraumatic.  ?   Right Ear: External ear normal.  ?   Left Ear: External ear normal.  ?   Nose: Nose  normal.  ?   Mouth/Throat:  ?   Mouth: Mucous membranes are moist.  ?   Pharynx: Oropharynx is clear.  ?Eyes:  ?   General: No scleral icterus.    ?   Right eye: No discharge.     ?   Left eye: No discharge.  ?   Extraocular Movements: Extraocular movements intact.  ?   Conjunctiva/sclera: Conjunctivae normal.  ?   Pupils: Pupils are equal, round, and reactive to light.  ?Cardiovascular:  ?   Rate and Rhythm: Normal rate and regular rhythm.  ?   Pulses: Normal pulses.  ?   Heart sounds: Normal heart sounds. No murmur heard. ?  No friction rub. No gallop.  ?Pulmonary:  ?   Effort: Pulmonary effort is normal. No respiratory distress.  ?   Breath sounds: Normal breath sounds. No stridor. No wheezing, rhonchi or rales.  ?Chest:  ?   Chest wall: No tenderness.  ?Abdominal:  ?   General: Abdomen is flat.  Bowel sounds are normal. There is no distension.  ?   Palpations: Abdomen is soft. There is no mass.  ?   Tenderness: There is abdominal tenderness (epigastric). There is no right CVA tenderness, left CVA tenderness, guarding or rebound.  ?   Hernia: No hernia is present.  ?Musculoskeletal:     ?   General: Normal range of motion.  ?   Cervical back: Normal range of motion and neck supple.  ?Skin: ?   General: Skin is warm and dry.  ?   Capillary Refill: Capillary refill takes less than 2 seconds.  ?   Coloration: Skin is not jaundiced or pale.  ?   Findings: No bruising, erythema, lesion or rash.  ?Neurological:  ?   General: No focal deficit present.  ?   Mental Status: She is alert and oriented to person, place, and time. Mental status is at baseline.  ?Psychiatric:     ?   Mood and Affect: Mood normal.     ?   Behavior: Behavior normal.     ?   Thought Content: Thought content normal.     ?   Judgment: Judgment normal.  ? ? ?Results for orders placed or performed in visit on 07/23/19  ?Microscopic Examination  ? URINE  ?Result Value Ref Range  ? WBC, UA None seen 0 - 5 /hpf  ? RBC 3-10 (A) 0 - 2 /hpf  ? Epithelial Cells (non renal) 0-10 0 - 10 /hpf  ? Bacteria, UA None seen None seen/Few  ?Comprehensive metabolic panel  ?Result Value Ref Range  ? Glucose 92 65 - 99 mg/dL  ? BUN 13 6 - 24 mg/dL  ? Creatinine, Ser 0.99 0.57 - 1.00 mg/dL  ? GFR calc non Af Amer 72 >59 mL/min/1.73  ? GFR calc Af Amer 82 >59 mL/min/1.73  ? BUN/Creatinine Ratio 13 9 - 23  ? Sodium 138 134 - 144 mmol/L  ? Potassium 4.1 3.5 - 5.2 mmol/L  ? Chloride 99 96 - 106 mmol/L  ? CO2 23 20 - 29 mmol/L  ? Calcium 9.7 8.7 - 10.2 mg/dL  ? Total Protein 7.4 6.0 - 8.5 g/dL  ? Albumin 5.0 (H) 3.8 - 4.8 g/dL  ? Globulin, Total 2.4 1.5 - 4.5 g/dL  ? Albumin/Globulin Ratio 2.1 1.2 - 2.2  ? Bilirubin Total 0.4 0.0 - 1.2 mg/dL  ? Alkaline Phosphatase 59 39 - 117 IU/L  ? AST 25 0 - 40 IU/L  ?  ALT 41 (H) 0 - 32 IU/L  ?Lipid Panel w/o Chol/HDL Ratio  ?Result  Value Ref Range  ? Cholesterol, Total 169 100 - 199 mg/dL  ? Triglycerides 302 (H) 0 - 149 mg/dL  ? HDL 49 >39 mg/dL  ? VLDL Cholesterol Cal 48 (H) 5 - 40 mg/dL  ? LDL Chol Calc (NIH) 72 0 - 99 mg/dL  ?TSH  ?Result Value Ref Range  ? TSH 1.090 0.450 - 4.500 uIU/mL  ?UA/M w/rflx Culture, Routine  ? Specimen: Urine  ? URINE  ?Result Value Ref Range  ? Specific Gravity, UA 1.015 1.005 - 1.030  ? pH, UA 5.5 5.0 - 7.5  ? Color, UA Yellow Yellow  ? Appearance Ur Clear Clear  ? Leukocytes,UA Negative Negative  ? Protein,UA Negative Negative/Trace  ? Glucose, UA Negative Negative  ? Ketones, UA Negative Negative  ? RBC, UA 3+ (A) Negative  ? Bilirubin, UA Negative Negative  ? Urobilinogen, Ur 0.2 0.2 - 1.0 mg/dL  ? Nitrite, UA Negative Negative  ? Microscopic Examination See below:   ?VITAMIN D 25 Hydroxy (Vit-D Deficiency, Fractures)  ?Result Value Ref Range  ? Vit D, 25-Hydroxy 16.3 (L) 30.0 - 100.0 ng/mL  ?Bayer DCA Hb A1c Waived  ?Result Value Ref Range  ? HB A1C (BAYER DCA - WAIVED) 5.0 <7.0 %  ?LH  ?Result Value Ref Range  ? LH 4.9 mIU/mL  ?FSH  ?Result Value Ref Range  ? FSH 8.3 mIU/mL  ?Estradiol  ?Result Value Ref Range  ? Estradiol 29.8 pg/mL  ?Testosterone, free, total([facility])  ?Result Value Ref Range  ? Testosterone 12 8 - 48 ng/dL  ? Testosterone, Free 0.6 0.0 - 4.2 pg/mL  ? Sex Hormone Binding 74.4 24.6 - 122.0 nmol/L  ?Prolactin  ?Result Value Ref Range  ? Prolactin 15.9 4.8 - 23.3 ng/mL  ? ?   ?Assessment & Plan:  ? ?Problem List Items Addressed This Visit   ? ?  ? Digestive  ? Gastroesophageal reflux disease - Primary  ?  Not doing well with vomiting x several months. Will start omeprazole, but likely needs EGD. Referral to GI placed today.  ?  ?  ? Relevant Medications  ? omeprazole (PRILOSEC) 20 MG capsule  ? Other Relevant Orders  ? CBC with Differential/Platelet  ? Comprehensive metabolic panel  ? Urinalysis, Routine w reflex microscopic  ? TSH  ? Ambulatory referral to Gastroenterology  ? H.  pylori antigen, stool  ?  ? Other  ? Anxiety and depression  ?  Has been off her effexor. Will restart and recheck 1 month. Call with any concerns.  ?  ?  ? Relevant Medications  ? venlafaxine XR (EFFEXOR-XR) 75 MG 24 hr c

## 2021-10-02 NOTE — Assessment & Plan Note (Signed)
Has been off her effexor. Will restart and recheck 1 month. Call with any concerns.  ?

## 2021-10-03 ENCOUNTER — Other Ambulatory Visit: Payer: Self-pay | Admitting: Family Medicine

## 2021-10-03 DIAGNOSIS — D500 Iron deficiency anemia secondary to blood loss (chronic): Secondary | ICD-10-CM

## 2021-10-03 LAB — CBC WITH DIFFERENTIAL/PLATELET
Basophils Absolute: 0.1 10*3/uL (ref 0.0–0.2)
Basos: 1 %
EOS (ABSOLUTE): 0.1 10*3/uL (ref 0.0–0.4)
Eos: 1 %
Hematocrit: 29.9 % — ABNORMAL LOW (ref 34.0–46.6)
Hemoglobin: 8.4 g/dL — ABNORMAL LOW (ref 11.1–15.9)
Immature Grans (Abs): 0 10*3/uL (ref 0.0–0.1)
Immature Granulocytes: 0 %
Lymphocytes Absolute: 1.8 10*3/uL (ref 0.7–3.1)
Lymphs: 23 %
MCH: 20.3 pg — ABNORMAL LOW (ref 26.6–33.0)
MCHC: 28.1 g/dL — ABNORMAL LOW (ref 31.5–35.7)
MCV: 72 fL — ABNORMAL LOW (ref 79–97)
Monocytes Absolute: 0.5 10*3/uL (ref 0.1–0.9)
Monocytes: 7 %
Neutrophils Absolute: 5.3 10*3/uL (ref 1.4–7.0)
Neutrophils: 68 %
Platelets: 272 10*3/uL (ref 150–450)
RBC: 4.13 x10E6/uL (ref 3.77–5.28)
RDW: 18.8 % — ABNORMAL HIGH (ref 11.7–15.4)
WBC: 7.7 10*3/uL (ref 3.4–10.8)

## 2021-10-03 LAB — COMPREHENSIVE METABOLIC PANEL
ALT: 67 IU/L — ABNORMAL HIGH (ref 0–32)
AST: 73 IU/L — ABNORMAL HIGH (ref 0–40)
Albumin/Globulin Ratio: 1.9 (ref 1.2–2.2)
Albumin: 4.5 g/dL (ref 3.8–4.8)
Alkaline Phosphatase: 62 IU/L (ref 44–121)
BUN/Creatinine Ratio: 13 (ref 9–23)
BUN: 10 mg/dL (ref 6–24)
Bilirubin Total: 0.6 mg/dL (ref 0.0–1.2)
CO2: 24 mmol/L (ref 20–29)
Calcium: 8.8 mg/dL (ref 8.7–10.2)
Chloride: 103 mmol/L (ref 96–106)
Creatinine, Ser: 0.8 mg/dL (ref 0.57–1.00)
Globulin, Total: 2.4 g/dL (ref 1.5–4.5)
Glucose: 83 mg/dL (ref 70–99)
Potassium: 3.9 mmol/L (ref 3.5–5.2)
Sodium: 141 mmol/L (ref 134–144)
Total Protein: 6.9 g/dL (ref 6.0–8.5)
eGFR: 94 mL/min/{1.73_m2} (ref >59)

## 2021-10-03 LAB — LIPID PANEL W/O CHOL/HDL RATIO
Cholesterol, Total: 182 mg/dL (ref 100–199)
HDL: 38 mg/dL — ABNORMAL LOW (ref >39)
LDL Chol Calc (NIH): 108 mg/dL — ABNORMAL HIGH (ref 0–99)
Triglycerides: 206 mg/dL — ABNORMAL HIGH (ref 0–149)
VLDL Cholesterol Cal: 36 mg/dL (ref 5–40)

## 2021-10-03 LAB — TSH: TSH: 1.55 u[IU]/mL (ref 0.450–4.500)

## 2021-10-03 LAB — HEPATITIS C ANTIBODY: Hep C Virus Ab: NONREACTIVE

## 2021-10-06 LAB — H. PYLORI ANTIGEN, STOOL: H pylori Ag, Stl: NEGATIVE

## 2021-10-10 ENCOUNTER — Inpatient Hospital Stay: Payer: 59

## 2021-10-10 ENCOUNTER — Encounter: Payer: Self-pay | Admitting: Oncology

## 2021-10-10 ENCOUNTER — Inpatient Hospital Stay: Payer: 59 | Attending: Oncology | Admitting: Oncology

## 2021-10-10 VITALS — BP 150/84 | HR 83 | Temp 98.2°F | Ht 61.5 in | Wt 228.0 lb

## 2021-10-10 DIAGNOSIS — Z79899 Other long term (current) drug therapy: Secondary | ICD-10-CM | POA: Diagnosis not present

## 2021-10-10 DIAGNOSIS — R7401 Elevation of levels of liver transaminase levels: Secondary | ICD-10-CM | POA: Insufficient documentation

## 2021-10-10 DIAGNOSIS — E538 Deficiency of other specified B group vitamins: Secondary | ICD-10-CM | POA: Diagnosis present

## 2021-10-10 DIAGNOSIS — Z789 Other specified health status: Secondary | ICD-10-CM

## 2021-10-10 DIAGNOSIS — F1090 Alcohol use, unspecified, uncomplicated: Secondary | ICD-10-CM | POA: Insufficient documentation

## 2021-10-10 DIAGNOSIS — D509 Iron deficiency anemia, unspecified: Secondary | ICD-10-CM | POA: Insufficient documentation

## 2021-10-10 DIAGNOSIS — D508 Other iron deficiency anemias: Secondary | ICD-10-CM

## 2021-10-10 HISTORY — DX: Iron deficiency anemia, unspecified: D50.9

## 2021-10-10 LAB — CBC WITH DIFFERENTIAL/PLATELET
Abs Immature Granulocytes: 0.02 10*3/uL (ref 0.00–0.07)
Basophils Absolute: 0.1 10*3/uL (ref 0.0–0.1)
Basophils Relative: 1 %
Eosinophils Absolute: 0.1 10*3/uL (ref 0.0–0.5)
Eosinophils Relative: 1 %
HCT: 33.3 % — ABNORMAL LOW (ref 36.0–46.0)
Hemoglobin: 9.2 g/dL — ABNORMAL LOW (ref 12.0–15.0)
Immature Granulocytes: 0 %
Lymphocytes Relative: 27 %
Lymphs Abs: 2.2 10*3/uL (ref 0.7–4.0)
MCH: 20.4 pg — ABNORMAL LOW (ref 26.0–34.0)
MCHC: 27.6 g/dL — ABNORMAL LOW (ref 30.0–36.0)
MCV: 74 fL — ABNORMAL LOW (ref 80.0–100.0)
Monocytes Absolute: 0.6 10*3/uL (ref 0.1–1.0)
Monocytes Relative: 8 %
Neutro Abs: 5 10*3/uL (ref 1.7–7.7)
Neutrophils Relative %: 63 %
Platelets: 305 10*3/uL (ref 150–400)
RBC: 4.5 MIL/uL (ref 3.87–5.11)
RDW: 19.8 % — ABNORMAL HIGH (ref 11.5–15.5)
WBC: 8 10*3/uL (ref 4.0–10.5)
nRBC: 0 % (ref 0.0–0.2)

## 2021-10-10 LAB — IRON AND TIBC
Iron: 36 ug/dL (ref 28–170)
Saturation Ratios: 6 % — ABNORMAL LOW (ref 10.4–31.8)
TIBC: 585 ug/dL — ABNORMAL HIGH (ref 250–450)
UIBC: 549 ug/dL

## 2021-10-10 LAB — TECHNOLOGIST SMEAR REVIEW: Plt Morphology: ADEQUATE

## 2021-10-10 LAB — RETIC PANEL
Immature Retic Fract: 21.9 % — ABNORMAL HIGH (ref 2.3–15.9)
RBC.: 4.47 MIL/uL (ref 3.87–5.11)
Retic Count, Absolute: 80.9 10*3/uL (ref 19.0–186.0)
Retic Ct Pct: 1.8 % (ref 0.4–3.1)
Reticulocyte Hemoglobin: 18.7 pg — ABNORMAL LOW (ref >27.9)

## 2021-10-10 LAB — FERRITIN: Ferritin: 8 ng/mL — ABNORMAL LOW (ref 11–307)

## 2021-10-10 LAB — HEPATITIS B CORE ANTIBODY, IGM: Hep B C IgM: NONREACTIVE

## 2021-10-10 LAB — FOLATE: Folate: 5.7 ng/mL — ABNORMAL LOW (ref >5.9)

## 2021-10-10 LAB — HEPATITIS B SURFACE ANTIGEN: Hepatitis B Surface Ag: NONREACTIVE

## 2021-10-10 LAB — VITAMIN B12: Vitamin B-12: 603 pg/mL (ref 180–914)

## 2021-10-10 MED ORDER — FOLIC ACID 400 MCG PO TABS: 400.0000 ug | ORAL_TABLET | Freq: Every day | ORAL | 0 refills | Status: DC

## 2021-10-10 NOTE — Progress Notes (Signed)
?Hematology/Oncology Consult note ?[phone] [phone] ?  ? ?   ? ? ?Patient Care Team: ?Dorcas Carrow, DO as PCP - General (Family Medicine) ? ?REFERRING PROVIDER: ?Olevia Perches P, DO  ?CHIEF COMPLAINTS/REASON FOR VISIT:  ?Evaluation of iron deficiency anemia ? ?HISTORY OF PRESENTING ILLNESS:  ? ?Amber [name] is a  41 y.o.  female with PMH listed below was seen in consultation at the request of  Dorcas Carrow, DO  for evaluation of iron deficiency anemia ? ?10/02/2021, patient had a CBC done which showed hemoglobin 8.4, MCV 72.  Normal white count and platelet count. ?Reviewed her previous history, patient has a chronic history of anemia, microcytic, since at least 2017. ?It was felt that patient may have iron deficiency anemia and she was referred to hematology for further evaluation and treatments. ?Patient reports feeling fatigued.  Craving for ice chips. ?Menstrual period flow is normal, usually 5 days, 2 to 3 days is heavy.  Denies any melena, bloody stool, abdominal pain, unintentional weight loss. ? ?She drinks alcohol once per week.  She drinks wine ?Marland Kitchen ?Review of Systems  ?Constitutional:  Positive for fatigue. Negative for appetite change, chills and fever.  ?HENT:   Negative for hearing loss and voice change.   ?Eyes:  Negative for eye problems.  ?Respiratory:  Negative for chest tightness and cough.   ?Cardiovascular:  Negative for chest pain.  ?Gastrointestinal:  Negative for abdominal distention, abdominal pain and blood in stool.  ?Endocrine: Negative for hot flashes.  ?Genitourinary:  Negative for difficulty urinating and frequency.   ?Musculoskeletal:  Negative for arthralgias.  ?Skin:  Negative for itching and rash.  ?Neurological:  Negative for extremity weakness.  ?Hematological:  Negative for adenopathy.  ?Psychiatric/Behavioral:  Negative for confusion.   ? ? ? ?MEDICAL HISTORY:  ?Past Medical History:  ?Diagnosis Date  ? Anemia   ? Anxiety   ? Congenital  malrotation of intestine   ? Depression   ? GERD (gastroesophageal reflux disease)   ? OCC  ? Headache   ? H/O  ? Obesity (BMI 30-39.9)   ? OSA (obstructive sleep apnea)   ? USE CPAP  ? ? ?SURGICAL HISTORY: ?Past Surgical History:  ?Procedure Laterality Date  ? BREAST REDUCTION SURGERY  12/18/02  ? CESAREAN SECTION    ? X 2  ? CESAREAN SECTION WITH BILATERAL TUBAL LIGATION N/A 08/04/2015  ? Procedure: CESAREAN SECTION WITH BILATERAL TUBAL LIGATION;  Surgeon: Nadara Mustard, MD;  Location: [facility] ORS;  Service: Obstetrics;  Laterality: N/A;  ? LAPAROSCOPIC CHOLECYSTECTOMY W/ CHOLANGIOGRAPHY  01/22/11  ? LESION REMOVAL N/A 05/04/2016  ? Procedure: EXCISION VAGINAL LESION;  Surgeon: Nadara Mustard, MD;  Location: [facility] OR[facility];  Service: Gynecology;  Laterality: N/A;  ? NASAL SINUS SURGERY  05/26/03  ? SLEEVE GASTROPLASTY    ? TUBAL LIGATION    ? ? ?SOCIAL HISTORY: ?Social History  ? ?Socioeconomic History  ? Marital status: Married  ?  Spouse name: Not on file  ? Number of children: Not on file  ? Years of education: Not on file  ? Highest education level: Not on file  ?Occupational History  ? Not on file  ?Tobacco Use  ? Smoking status: Former  ?  Packs/day: 0.50  ?  Years: 2.00  ?  Pack years: 1.00  ?  Types: Cigarettes  ?  Quit date: 01/05/2003  ?  Years since quitting: 18.7  ? Smokeless tobacco: Never  ?Vaping Use  ? Vaping  Use: Never used  ?Substance and Sexual Activity  ? Alcohol use: Yes  ?  Comment: once a week, wine  ? Drug use: No  ? Sexual activity: Yes  ?  Birth control/protection: Surgical  ?Other Topics Concern  ? Not on file  ?Social History Narrative  ? Married.  ? 2 children last kid had in 2017   ? Works in Clinical biochemistcustomer service.  ? Enjoys spending time with family.  ? ?Social Determinants of Health  ? ?Financial Resource Strain: Not on file  ?Food Insecurity: Not on file  ?Transportation Needs: Not on file  ?Physical Activity: Not on file  ?Stress: Not on file  ?Social Connections: Not on file  ?Intimate Partner  Violence: Not on file  ? ? ?FAMILY HISTORY: ?Family History  ?Problem Relation Age of Onset  ? Hypertension Mother   ? Diabetes Mother   ? Hypertension Brother   ? Leukemia Maternal Grandmother 40  ? Diabetes Maternal Grandfather   ? COPD Maternal Grandfather   ? Heart disease Maternal Grandfather   ? ? ?ALLERGIES:  is allergic to penicillins, toradol [ketorolac tromethamine], wellbutrin [bupropion], and sulfa antibiotics. ? ?MEDICATIONS:  ?Current Outpatient Medications  ?Medication Sig Dispense Refill  ? meclizine (ANTIVERT) 25 MG tablet Take 1 tablet (25 mg total) by mouth 3 (three) times daily as needed for dizziness. 90 tablet 2  ? Multiple Vitamin (MULTI-VITAMINS) TABS Take 1 tablet by mouth daily.    ? omeprazole (PRILOSEC) 20 MG capsule Take 1 capsule (20 mg total) by mouth daily. 30 capsule 3  ? QUEtiapine (SEROQUEL XR) 50 MG TB24 24 hr tablet Take 1-2 tablets (50-100 mg total) by mouth at bedtime. 180 tablet 1  ? venlafaxine XR (EFFEXOR-XR) 75 MG 24 hr capsule 1 tablet daily for 1 week, then increase to 2 tabs daily 60 capsule 2  ? ?No current facility-administered medications for this visit.  ? ? ? ?PHYSICAL EXAMINATION: ?ECOG PERFORMANCE STATUS: 0 - Asymptomatic ?Vitals:  ? 10/10/21 1508  ?BP: (!) 150/84  ?Pulse: 83  ?Temp: 98.2 ?F (36.8 ?C)  ? ?Filed Weights  ? 10/10/21 1508  ?Weight: 228 lb (103.4 kg)  ? ? ?Physical Exam ?Constitutional:   ?   General: She is not in acute distress. ?   Appearance: She is obese.  ?HENT:  ?   Head: Normocephalic and atraumatic.  ?Eyes:  ?   General: No scleral icterus. ?Cardiovascular:  ?   Rate and Rhythm: Normal rate and regular rhythm.  ?   Heart sounds: Normal heart sounds.  ?Pulmonary:  ?   Effort: Pulmonary effort is normal. No respiratory distress.  ?   Breath sounds: No wheezing.  ?Abdominal:  ?   General: Bowel sounds are normal. There is no distension.  ?   Palpations: Abdomen is soft.  ?Musculoskeletal:     ?   General: No deformity. Normal range of motion.  ?    Cervical back: Normal range of motion and neck supple.  ?Skin: ?   General: Skin is warm and dry.  ?   Findings: No erythema or rash.  ?Neurological:  ?   Mental Status: She is alert and oriented to person, place, and time. Mental status is at baseline.  ?   Cranial Nerves: No cranial nerve deficit.  ?   Coordination: Coordination normal.  ?Psychiatric:     ?   Mood and Affect: Mood normal.  ? ? ?LABORATORY DATA:  ?I have reviewed the data as listed ?Lab Results  ?  Component Value Date  ? WBC 8.0 10/10/2021  ? HGB 9.2 (L) 10/10/2021  ? HCT 33.3 (L) 10/10/2021  ? MCV 74.0 (L) 10/10/2021  ? PLT 305 10/10/2021  ? ?Recent Labs  ?  10/02/21 ?1631  ?NA 141  ?K 3.9  ?CL 103  ?CO2 24  ?GLUCOSE 83  ?BUN 10  ?CREATININE 0.80  ?CALCIUM 8.8  ?PROT 6.9  ?ALBUMIN 4.5  ?AST 73*  ?ALT 67*  ?ALKPHOS 62  ?BILITOT 0.6  ? ?Iron/TIBC/Ferritin/ %Sat ?   ?Component Value Date/Time  ? IRON 36 10/10/2021 1602  ? IRON 72 02/27/2017 1205  ? TIBC 585 (H) 10/10/2021 1602  ? TIBC 408 02/27/2017 1205  ? FERRITIN 8 (L) 10/10/2021 1602  ? IRONPCTSAT 6 (L) 10/10/2021 1602  ? IRONPCTSAT 18 02/27/2017 1205  ?  ? ? ?RADIOGRAPHIC STUDIES: ?I have personally reviewed the radiological images as listed and agreed with the findings in the report. ?No results found. ? ? ? ?ASSESSMENT & PLAN:  ?1. Other iron deficiency anemia   ?2. Alcohol use   ?3. Transaminitis   ? ?#Microcytic anemia, check iron, TIBC ferritin, CBC.  Celiac panel, ?We discussed about oral iron supplementation versus iron infusion.  Patient has previously tried oral iron supplementation and not able to tolerate.  She is interested in IV Venofer treatments. ? ?Risks of infusion reactions including anaphylactic reactions were discussed with patient. Other side effects include but not limited to high blood pressure, headache,wheezing, SOB, skin rash and itchiness, weight gain, leg swelling, etc. Patient voices understanding and is willing to proceed. ?Today's labs is consistent with iron  deficiency.  Ferritin is decreased at 8, iron saturation 6.  Hemoglobin 9.2.,  Recommend IV Venofer weekly x5 . ? ?#Alcohol use, I will also check folate acid, vitamin B12 level. ?#Folate deficiency, folate leve

## 2021-10-11 ENCOUNTER — Telehealth: Payer: Self-pay | Admitting: Family Medicine

## 2021-10-11 ENCOUNTER — Telehealth: Payer: Self-pay

## 2021-10-11 NOTE — Telephone Encounter (Signed)
Pt informed of MD recommendation and verbalized understanding. Pt requesting later afternoon appts.  ? ?Please schedule and inform pt of appts:  ?IV venofer *new* weekly x5 ?Labs in 3 months, then MD/ Venofer 1-2 days after labs.  ?

## 2021-10-11 NOTE — Telephone Encounter (Signed)
-----   Message from Rickard Patience, MD sent at 10/10/2021  9:30 PM EDT ----- ?Let patient know that her iron level is low.  I recommend IV Venofer weekly x5.  Please arrange Venofer appointment.  She has asked work excuse for her doctor visits and treatment visits.  Please provide. ?Folate level is low.  Recommend patient to start folic acid supplementation.  Prescription was sent to pharmacy. ?Please arrange her to follow-up in 3 months, labs prior to MD +/- Venofer.  Labs are ordered.  Thank you ?

## 2021-10-11 NOTE — Telephone Encounter (Signed)
Copied from CRM [id number]. Topic: General - Other ?>> Oct 10, 2021  4:31 PM Marylen Ponto wrote: ?Reason for CRM: Pt upset because she stated she was under the impression that she would be getting an iron infusion but when she saw the provider she was told an iron deficiency was not done and her insurance may cover the infusion. Pt upset that she missed work and the appt was not for what she was told. Pt also upset about the referral for GI because they left her a message and when she returned the call she was told they do not have any orders placed for her. Pt does not understand how they call her but then stated they do not have any orders for her. Pt requests call back. ?

## 2021-10-11 NOTE — Telephone Encounter (Signed)
Roderic Palau, will you change MD to be the same day as Venofer please. Labs will be prior to MD/ Venofer. Thanks.  ?

## 2021-10-12 ENCOUNTER — Telehealth: Payer: Self-pay | Admitting: Gastroenterology

## 2021-10-12 ENCOUNTER — Telehealth: Payer: Self-pay | Admitting: Oncology

## 2021-10-12 LAB — CELIAC PANEL 10
Antigliadin Abs, IgA: 5 units (ref 0–19)
Endomysial Ab, IgA: NEGATIVE
Gliadin IgG: 2 units (ref 0–19)
IgA: 249 mg/dL (ref 87–352)
Tissue Transglut Ab: <2 U/mL (ref 0–5)
Tissue Transglutaminase Ab, IgA: 4 U/mL — ABNORMAL HIGH (ref 0–3)

## 2021-10-12 NOTE — Telephone Encounter (Signed)
Pt req appts be later in the day, 130p is too early ?

## 2021-10-12 NOTE — Telephone Encounter (Signed)
Pt returned call to sched Endoscopy prefers an Friday appointment.on VM ?

## 2021-10-12 NOTE — Telephone Encounter (Signed)
Spoke with patient and made her aware of Dr.Johnson recommendations. Patient states when it comes to her getting in contact with [facility]. She says they are playing phone tag. Patient states she keep receiving notifications from MyChart saying that she is scheduled for iron fusions. Patient states she assumes she is approved for them. I advised the patient to reach out to the office that scheduled the appointments or to her insurance to gather more information. Patient verbalized understanding at this time.  ?

## 2021-10-12 NOTE — Telephone Encounter (Signed)
She saw Dr. Tasia Catchings on 4/18 and Dr Tasia Catchings discussed iron infusions with her. I told her I was going to get her into see the hematologist to discuss iron infusions but I do not order iron infusions and I do not know what her insurance covers.  ?

## 2021-10-18 ENCOUNTER — Inpatient Hospital Stay: Payer: 59

## 2021-10-18 VITALS — BP 147/82 | HR 84 | Temp 98.4°F | Resp 18

## 2021-10-18 DIAGNOSIS — D508 Other iron deficiency anemias: Secondary | ICD-10-CM | POA: Diagnosis not present

## 2021-10-18 MED ORDER — IRON SUCROSE 20 MG/ML IV SOLN
200.0000 mg | Freq: Once | INTRAVENOUS | Status: AC
  Administered 2021-10-18: 200 mg via INTRAVENOUS
  Filled 2021-10-18: qty 10

## 2021-10-18 MED ORDER — SODIUM CHLORIDE 0.9 % IV SOLN: 200.0000 mg | Freq: Once | INTRAVENOUS | Status: DC

## 2021-10-18 MED ORDER — SODIUM CHLORIDE 0.9 % IV SOLN
Freq: Once | INTRAVENOUS | Status: AC
  Filled 2021-10-18: qty 250

## 2021-10-18 NOTE — Patient Instructions (Signed)
[facility]  Discharge Instructions: ?Thank you for choosing [facility] to provide your oncology and hematology care.  ?If you have a lab appointment with the Cancer Center, please go directly to the Cancer Center and check in at the registration area. ? ?Wear comfortable clothing and clothing appropriate for easy access to any Portacath or PICC line.  ? ?We strive to give you quality time with your provider. You may need to reschedule your appointment if you arrive late (15 or more minutes).  Arriving late affects you and other patients whose appointments are after yours.  Also, if you miss three or more appointments without notifying the office, you may be dismissed from the clinic at the provider?s discretion.    ?  ?For prescription refill requests, have your pharmacy contact our office and allow 72 hours for refills to be completed.   ? ?Today you received the following chemotherapy and/or immunotherapy agents VENOFER ?    ?  ?To help prevent nausea and vomiting after your treatment, we encourage you to take your nausea medication as directed. ? ?BELOW ARE SYMPTOMS THAT SHOULD BE REPORTED IMMEDIATELY: ?*FEVER GREATER THAN 100.4 F (38 ?C) OR HIGHER ?*CHILLS OR SWEATING ?*NAUSEA AND VOMITING THAT IS NOT CONTROLLED WITH YOUR NAUSEA MEDICATION ?*UNUSUAL SHORTNESS OF BREATH ?*UNUSUAL BRUISING OR BLEEDING ?*URINARY PROBLEMS (pain or burning when urinating, or frequent urination) ?*BOWEL PROBLEMS (unusual diarrhea, constipation, pain near the anus) ?TENDERNESS IN MOUTH AND THROAT WITH OR WITHOUT PRESENCE OF ULCERS (sore throat, sores in mouth, or a toothache) ?UNUSUAL RASH, SWELLING OR PAIN  ?UNUSUAL VAGINAL DISCHARGE OR ITCHING  ? ?Items with * indicate a potential emergency and should be followed up as soon as possible or go to the Emergency Department if any problems should occur. ? ?Please show the CHEMOTHERAPY ALERT CARD or IMMUNOTHERAPY ALERT CARD at check-in to  the Emergency Department and triage nurse. ? ?Should you have questions after your visit or need to cancel or reschedule your appointment, please contact [facility]  [phone] and follow the prompts.  Office hours are 8:00 a.m. to 4:30 p.m. Monday - Friday. Please note that voicemails left after 4:00 p.m. may not be returned until the following business day.  We are closed weekends and major holidays. You have access to a nurse at all times for urgent questions. Please call the main number to the clinic [phone] and follow the prompts. ? ?For any non-urgent questions, you may also contact your provider using MyChart. We now offer e-Visits for anyone 18 and older to request care online for non-urgent symptoms. For details visit [email or web address]. ?  ?Also download the MyChart app! Go to the app store, search "MyChart", open the app, select [place], and log in with your MyChart username and password. ? ?Due to Covid, a mask is required upon entering the hospital/clinic. If you do not have a mask, one will be given to you upon arrival. For doctor visits, patients may have 1 support person aged 18 or older with them. For treatment visits, patients cannot have anyone with them due to current Covid guidelines and our immunocompromised population.  ? ?Iron Sucrose Injection ?What is this medication? ?IRON SUCROSE (EYE ern SOO krose) treats low levels of iron (iron deficiency anemia) in people with kidney disease. Iron is a mineral that plays an important role in making red blood cells, which carry oxygen from your lungs to the rest of your body. ?This medicine   may be used for other purposes; ask your health care provider or pharmacist if you have questions. ?COMMON BRAND NAME(S): Venofer ?What should I tell my care team before I take this medication? ?They need to know if you have any of these conditions: ?Anemia not caused by low iron levels ?Heart disease ?High levels of  iron in the blood ?Kidney disease ?Liver disease ?An unusual or allergic reaction to iron, other medications, foods, dyes, or preservatives ?Pregnant or trying to get pregnant ?Breast-feeding ?How should I use this medication? ?This medication is for infusion into a vein. It is given in a hospital or clinic setting. ?Talk to your care team about the use of this medication in children. While this medication may be prescribed for children as young as 2 years for selected conditions, precautions do apply. ?Overdosage: If you think you have taken too much of this medicine contact a poison control center or emergency room at once. ?NOTE: This medicine is only for you. Do not share this medicine with others. ?What if I miss a dose? ?It is important not to miss your dose. Call your care team if you are unable to keep an appointment. ?What may interact with this medication? ?Do not take this medication with any of the following: ?Deferoxamine ?Dimercaprol ?Other iron products ?This medication may also interact with the following: ?Chloramphenicol ?Deferasirox ?This list may not describe all possible interactions. Give your health care provider a list of all the medicines, herbs, non-prescription drugs, or dietary supplements you use. Also tell them if you smoke, drink alcohol, or use illegal drugs. Some items may interact with your medicine. ?What should I watch for while using this medication? ?Visit your care team regularly. Tell your care team if your symptoms do not start to get better or if they get worse. You may need blood work done while you are taking this medication. ?You may need to follow a special diet. Talk to your care team. Foods that contain iron include: whole grains/cereals, dried fruits, beans, or peas, leafy green vegetables, and organ meats (liver, kidney). ?What side effects may I notice from receiving this medication? ?Side effects that you should report to your care team as soon as  possible: ?Allergic reactions--skin rash, itching, hives, swelling of the face, lips, tongue, or throat ?Low blood pressure--dizziness, feeling faint or lightheaded, blurry vision ?Shortness of breath ?Side effects that usually do not require medical attention (report to your care team if they continue or are bothersome): ?Flushing ?Headache ?Joint pain ?Muscle pain ?Nausea ?Pain, redness, or irritation at injection site ?This list may not describe all possible side effects. Call your doctor for medical advice about side effects. You may report side effects to FDA at 1-800-FDA-1088. ?Where should I keep my medication? ?This medication is given in a hospital or clinic and will not be stored at home. ?NOTE: This sheet is a summary. It may not cover all possible information. If you have questions about this medicine, talk to your doctor, pharmacist, or health care provider. ?? 2023 Elsevier/Gold Standard (2020-11-04 00:00:00) ? ?

## 2021-10-25 ENCOUNTER — Inpatient Hospital Stay: Payer: 59

## 2021-10-25 ENCOUNTER — Inpatient Hospital Stay: Payer: 59 | Attending: Oncology

## 2021-10-25 VITALS — BP 134/70 | HR 92 | Temp 98.5°F

## 2021-10-25 DIAGNOSIS — D508 Other iron deficiency anemias: Secondary | ICD-10-CM | POA: Diagnosis present

## 2021-10-25 DIAGNOSIS — E538 Deficiency of other specified B group vitamins: Secondary | ICD-10-CM | POA: Insufficient documentation

## 2021-10-25 MED ORDER — SODIUM CHLORIDE 0.9 % IV SOLN
Freq: Once | INTRAVENOUS | Status: AC
  Filled 2021-10-25: qty 250

## 2021-10-25 MED ORDER — IRON SUCROSE 20 MG/ML IV SOLN
200.0000 mg | Freq: Once | INTRAVENOUS | Status: AC
  Administered 2021-10-25: 200 mg via INTRAVENOUS
  Filled 2021-10-25: qty 10

## 2021-10-25 MED ORDER — SODIUM CHLORIDE 0.9 % IV SOLN: 200.0000 mg | Freq: Once | INTRAVENOUS | Status: DC

## 2021-10-25 NOTE — Patient Instructions (Signed)

## 2021-10-28 ENCOUNTER — Encounter: Payer: Self-pay | Admitting: Oncology

## 2021-11-01 ENCOUNTER — Inpatient Hospital Stay: Payer: 59

## 2021-11-01 VITALS — BP 150/89 | HR 90 | Temp 97.2°F

## 2021-11-01 DIAGNOSIS — D508 Other iron deficiency anemias: Secondary | ICD-10-CM | POA: Diagnosis not present

## 2021-11-01 MED ORDER — SODIUM CHLORIDE 0.9 % IV SOLN
Freq: Once | INTRAVENOUS | Status: AC
  Filled 2021-11-01: qty 250

## 2021-11-01 MED ORDER — IRON SUCROSE 20 MG/ML IV SOLN
200.0000 mg | Freq: Once | INTRAVENOUS | Status: AC
  Administered 2021-11-01: 200 mg via INTRAVENOUS
  Filled 2021-11-01: qty 10

## 2021-11-01 MED ORDER — SODIUM CHLORIDE 0.9 % IV SOLN: 200.0000 mg | Freq: Once | INTRAVENOUS | Status: DC

## 2021-11-01 NOTE — Patient Instructions (Signed)

## 2021-11-01 NOTE — Progress Notes (Signed)
Patient tolerated Venofer infusion well, no questions/concerns voiced. Patient stable at discharge. AVS given.   ?

## 2021-11-08 ENCOUNTER — Inpatient Hospital Stay: Payer: 59

## 2021-11-08 VITALS — BP 132/81 | HR 88 | Temp 97.7°F | Resp 17

## 2021-11-08 DIAGNOSIS — D508 Other iron deficiency anemias: Secondary | ICD-10-CM

## 2021-11-08 MED ORDER — IRON SUCROSE 20 MG/ML IV SOLN
200.0000 mg | Freq: Once | INTRAVENOUS | Status: AC
  Administered 2021-11-08: 200 mg via INTRAVENOUS
  Filled 2021-11-08: qty 10

## 2021-11-08 MED ORDER — SODIUM CHLORIDE 0.9 % IV SOLN
Freq: Once | INTRAVENOUS | Status: AC
  Filled 2021-11-08: qty 250

## 2021-11-08 MED ORDER — SODIUM CHLORIDE 0.9 % IV SOLN: 200.0000 mg | Freq: Once | INTRAVENOUS | Status: DC

## 2021-11-08 MED ORDER — SODIUM CHLORIDE 0.9% FLUSH
10.0000 mL | Freq: Once | INTRAVENOUS | Status: AC | PRN
  Administered 2021-11-08: 10 mL
  Filled 2021-11-08: qty 10

## 2021-11-08 NOTE — Patient Instructions (Signed)

## 2021-11-08 NOTE — Progress Notes (Signed)
Patient tolerated Venofer infusion well. Asks for future apt to be moved at a later time. Scheduler notified, Apts made and patient aware. No questions/concerns voiced. Patient stable at discharge. AVS given.   ?

## 2021-11-15 ENCOUNTER — Inpatient Hospital Stay: Payer: 59

## 2021-11-15 VITALS — BP 135/86 | HR 86 | Temp 97.6°F | Resp 18

## 2021-11-15 DIAGNOSIS — D508 Other iron deficiency anemias: Secondary | ICD-10-CM | POA: Diagnosis not present

## 2021-11-15 MED ORDER — IRON SUCROSE 20 MG/ML IV SOLN
200.0000 mg | Freq: Once | INTRAVENOUS | Status: AC
  Administered 2021-11-15: 200 mg via INTRAVENOUS
  Filled 2021-11-15: qty 10

## 2021-11-15 MED ORDER — SODIUM CHLORIDE 0.9 % IV SOLN: 200.0000 mg | Freq: Once | INTRAVENOUS | Status: DC

## 2021-11-15 MED ORDER — SODIUM CHLORIDE 0.9 % IV SOLN
Freq: Once | INTRAVENOUS | Status: AC
  Filled 2021-11-15: qty 250

## 2021-11-15 NOTE — Patient Instructions (Signed)
[facility]  Discharge Instructions: ?Thank you for choosing [facility] to provide your oncology and hematology care.  ?If you have a lab appointment with the Cancer Center, please go directly to the Cancer Center and check in at the registration area. ? ?Wear comfortable clothing and clothing appropriate for easy access to any Portacath or PICC line.  ? ?We strive to give you quality time with your provider. You may need to reschedule your appointment if you arrive late (15 or more minutes).  Arriving late affects you and other patients whose appointments are after yours.  Also, if you miss three or more appointments without notifying the office, you may be dismissed from the clinic at the provider?s discretion.    ?  ?For prescription refill requests, have your pharmacy contact our office and allow 72 hours for refills to be completed.   ? ?Today you received the following chemotherapy and/or immunotherapy agents VENOFER ?    ?  ?To help prevent nausea and vomiting after your treatment, we encourage you to take your nausea medication as directed. ? ?BELOW ARE SYMPTOMS THAT SHOULD BE REPORTED IMMEDIATELY: ?*FEVER GREATER THAN 100.4 F (38 ?C) OR HIGHER ?*CHILLS OR SWEATING ?*NAUSEA AND VOMITING THAT IS NOT CONTROLLED WITH YOUR NAUSEA MEDICATION ?*UNUSUAL SHORTNESS OF BREATH ?*UNUSUAL BRUISING OR BLEEDING ?*URINARY PROBLEMS (pain or burning when urinating, or frequent urination) ?*BOWEL PROBLEMS (unusual diarrhea, constipation, pain near the anus) ?TENDERNESS IN MOUTH AND THROAT WITH OR WITHOUT PRESENCE OF ULCERS (sore throat, sores in mouth, or a toothache) ?UNUSUAL RASH, SWELLING OR PAIN  ?UNUSUAL VAGINAL DISCHARGE OR ITCHING  ? ?Items with * indicate a potential emergency and should be followed up as soon as possible or go to the Emergency Department if any problems should occur. ? ?Please show the CHEMOTHERAPY ALERT CARD or IMMUNOTHERAPY ALERT CARD at check-in to  the Emergency Department and triage nurse. ? ?Should you have questions after your visit or need to cancel or reschedule your appointment, please contact [facility]  [phone] and follow the prompts.  Office hours are 8:00 a.m. to 4:30 p.m. Monday - Friday. Please note that voicemails left after 4:00 p.m. may not be returned until the following business day.  We are closed weekends and major holidays. You have access to a nurse at all times for urgent questions. Please call the main number to the clinic [phone] and follow the prompts. ? ?For any non-urgent questions, you may also contact your provider using MyChart. We now offer e-Visits for anyone 18 and older to request care online for non-urgent symptoms. For details visit [email or web address]. ?  ?Also download the MyChart app! Go to the app store, search "MyChart", open the app, select [place], and log in with your MyChart username and password. ? ?Due to Covid, a mask is required upon entering the hospital/clinic. If you do not have a mask, one will be given to you upon arrival. For doctor visits, patients may have 1 support person aged 18 or older with them. For treatment visits, patients cannot have anyone with them due to current Covid guidelines and our immunocompromised population.  ? ?Iron Sucrose Injection ?What is this medication? ?IRON SUCROSE (EYE ern SOO krose) treats low levels of iron (iron deficiency anemia) in people with kidney disease. Iron is a mineral that plays an important role in making red blood cells, which carry oxygen from your lungs to the rest of your body. ?This medicine   may be used for other purposes; ask your health care provider or pharmacist if you have questions. ?COMMON BRAND NAME(S): Venofer ?What should I tell my care team before I take this medication? ?They need to know if you have any of these conditions: ?Anemia not caused by low iron levels ?Heart disease ?High levels of  iron in the blood ?Kidney disease ?Liver disease ?An unusual or allergic reaction to iron, other medications, foods, dyes, or preservatives ?Pregnant or trying to get pregnant ?Breast-feeding ?How should I use this medication? ?This medication is for infusion into a vein. It is given in a hospital or clinic setting. ?Talk to your care team about the use of this medication in children. While this medication may be prescribed for children as young as 2 years for selected conditions, precautions do apply. ?Overdosage: If you think you have taken too much of this medicine contact a poison control center or emergency room at once. ?NOTE: This medicine is only for you. Do not share this medicine with others. ?What if I miss a dose? ?It is important not to miss your dose. Call your care team if you are unable to keep an appointment. ?What may interact with this medication? ?Do not take this medication with any of the following: ?Deferoxamine ?Dimercaprol ?Other iron products ?This medication may also interact with the following: ?Chloramphenicol ?Deferasirox ?This list may not describe all possible interactions. Give your health care provider a list of all the medicines, herbs, non-prescription drugs, or dietary supplements you use. Also tell them if you smoke, drink alcohol, or use illegal drugs. Some items may interact with your medicine. ?What should I watch for while using this medication? ?Visit your care team regularly. Tell your care team if your symptoms do not start to get better or if they get worse. You may need blood work done while you are taking this medication. ?You may need to follow a special diet. Talk to your care team. Foods that contain iron include: whole grains/cereals, dried fruits, beans, or peas, leafy green vegetables, and organ meats (liver, kidney). ?What side effects may I notice from receiving this medication? ?Side effects that you should report to your care team as soon as  possible: ?Allergic reactions--skin rash, itching, hives, swelling of the face, lips, tongue, or throat ?Low blood pressure--dizziness, feeling faint or lightheaded, blurry vision ?Shortness of breath ?Side effects that usually do not require medical attention (report to your care team if they continue or are bothersome): ?Flushing ?Headache ?Joint pain ?Muscle pain ?Nausea ?Pain, redness, or irritation at injection site ?This list may not describe all possible side effects. Call your doctor for medical advice about side effects. You may report side effects to FDA at 1-800-FDA-1088. ?Where should I keep my medication? ?This medication is given in a hospital or clinic and will not be stored at home. ?NOTE: This sheet is a summary. It may not cover all possible information. If you have questions about this medicine, talk to your doctor, pharmacist, or health care provider. ?? 2023 Elsevier/Gold Standard (2020-11-04 00:00:00) ? ?

## 2021-12-21 ENCOUNTER — Telehealth: Payer: Self-pay | Admitting: Oncology

## 2021-12-21 NOTE — Telephone Encounter (Signed)
LVM for pt to give [place] a call back to clarify message sent in chart. Pt needs to be r/s for LAB prior MD/Venofer 1-2 days after..Pt has strict scheduling needs. will need to confirm appts with her

## 2021-12-21 NOTE — Telephone Encounter (Signed)
LVM for pt to give us a call back to clarify message sent in chart. Pt needs to be r/s for LAB prior MD/Venofer 1-2 days after..Pt has strict scheduling needs. will need to confirm appts with her 

## 2021-12-28 ENCOUNTER — Other Ambulatory Visit: Payer: Self-pay | Admitting: Family Medicine

## 2021-12-28 NOTE — Telephone Encounter (Signed)
Requested Prescriptions  Pending Prescriptions Disp Refills  . venlafaxine XR (EFFEXOR-XR) 75 MG 24 hr capsule [Pharmacy Med Name: VENLAFAXINE HCL ER 75 MG CAP] 60 capsule 2    Sig: TAKE ONE CAPSULE BY MOUTH DAILY FOR 7 DAYS THEN TAKE TWO CAPSULES BY MOUTH DAILY     Psychiatry: Antidepressants - SNRI - desvenlafaxine & venlafaxine Failed - 12/28/2021  6:21 AM      Failed - Lipid Panel in normal range within the last 12 months    Cholesterol, Total  Date Value Ref Range Status  10/02/2021 182 100 - 199 mg/dL Final   LDL Chol Calc (NIH)  Date Value Ref Range Status  10/02/2021 108 (H) 0 - 99 mg/dL Final   HDL  Date Value Ref Range Status  10/02/2021 38 (L) >39 mg/dL Final   Triglycerides  Date Value Ref Range Status  10/02/2021 206 (H) 0 - 149 mg/dL Final         Passed - Cr in normal range and within 360 days    Creatinine  Date Value Ref Range Status  06/04/2012 0.71 0.60 - 1.30 mg/dL Final   Creatinine, Ser  Date Value Ref Range Status  10/02/2021 0.80 0.57 - 1.00 mg/dL Final         Passed - Completed PHQ-2 or PHQ-9 in the last 360 days      Passed - Last BP in normal range    BP Readings from Last 1 Encounters:  11/15/21 135/86         Passed - Valid encounter within last 6 months    Recent Outpatient Visits          2 months ago Gastroesophageal reflux disease, unspecified whether esophagitis present   [facility] [place], Megan P, DO   6 months ago Pneumonia due to COVID-19 virus   [facility] [place], [place], DO   1 year ago Anxiety and depression   [facility] [place], [place], DO   1 year ago Vertigo   [facility] [place], [place], DO   1 year ago Anxiety and depression   [facility] [place], [place], DO

## 2021-12-31 ENCOUNTER — Encounter: Payer: Self-pay | Admitting: Emergency Medicine

## 2021-12-31 ENCOUNTER — Ambulatory Visit
Admission: EM | Admit: 2021-12-31 | Discharge: 2021-12-31 | Disposition: A | Discharge status: Home / Self Care | Payer: 59 | Attending: Family Medicine | Admitting: Family Medicine

## 2021-12-31 DIAGNOSIS — J019 Acute sinusitis, unspecified: Secondary | ICD-10-CM | POA: Diagnosis not present

## 2021-12-31 MED ORDER — DOXYCYCLINE HYCLATE 100 MG PO CAPS: 100.0000 mg | ORAL_CAPSULE | Freq: Two times a day (BID) | ORAL | 0 refills | Status: DC

## 2021-12-31 MED ORDER — PREDNISONE 20 MG PO TABS: 20.0000 mg | ORAL_TABLET | Freq: Every day | ORAL | 0 refills | Status: AC

## 2021-12-31 NOTE — ED Provider Notes (Signed)
[facility] Cower    CSN: [id number] Arrival date & time: 12/31/21  7654      History   Chief Complaint Chief Complaint  Patient presents with   Headache   Nasal Congestion   Sinus Pressure   Generalized Body Aches    HPI Amber [name] is a 41 y.o. female.   HPI Patient presents today for evaluation of left sinus pressure, congestion, facial pain, and fatigue. She reports sleeping more than baseline.  She has been taking over-the-counter medication without any improvement in symptoms.  She has a history preorbital cellulitis due to an abscessed tooth which required hospitalization. She is afebrile. Past Medical History:  Diagnosis Date   Anemia    Anxiety    Congenital malrotation of intestine    Depression    GERD (gastroesophageal reflux disease)    OCC   Headache    H/O   IDA (iron deficiency anemia) 10/10/2021   Obesity (BMI 30-39.9)    OSA (obstructive sleep apnea)    USE CPAP    Patient Active Problem List   Diagnosis Date Noted   Alcohol use 10/10/2021   Transaminitis 10/10/2021   IDA (iron deficiency anemia) 10/10/2021   S/P gastric sleeve procedure 10/02/2021   Gastroesophageal reflux disease 12/02/2018   OSA (obstructive sleep apnea) 12/17/2017   Lateral epicondylitis of right elbow 08/21/2017   Anxiety and depression 04/02/2016   Morbid (severe) obesity due to excess calories (HCC) 11/29/2015    Past Surgical History:  Procedure Laterality Date   BREAST REDUCTION SURGERY  12/18/02   CESAREAN SECTION     X 2   CESAREAN SECTION WITH BILATERAL TUBAL LIGATION N/A 08/04/2015   Procedure: CESAREAN SECTION WITH BILATERAL TUBAL LIGATION;  Surgeon: Nadara Mustard, MD;  Location: [facility] ORS;  Service: Obstetrics;  Laterality: N/A;   LAPAROSCOPIC CHOLECYSTECTOMY W/ CHOLANGIOGRAPHY  01/22/11   LESION REMOVAL N/A 05/04/2016   Procedure: EXCISION VAGINAL LESION;  Surgeon: Nadara Mustard, MD;  Location: [facility] ORS;  Service: Gynecology;  Laterality: N/A;   NASAL  SINUS SURGERY  05/26/03   SLEEVE GASTROPLASTY     TUBAL LIGATION      OB History     Gravida  2   Para  2   Term  2   Preterm      AB      Living  2      SAB      IAB      Ectopic      Multiple  0   Live Births  2            Home Medications    Prior to Admission medications   Medication Sig Start Date End Date Taking? Authorizing Provider  doxycycline (VIBRAMYCIN) 100 MG capsule Take 1 capsule (100 mg total) by mouth 2 (two) times daily. 12/31/21  Yes Bing Neighbors, FNP  predniSONE (DELTASONE) 20 MG tablet Take 1 tablet (20 mg total) by mouth daily with breakfast for 3 days. 12/31/21 01/03/22 Yes Bing Neighbors, FNP  folic acid (V-R FOLIC ACID) 400 MCG tablet Take 1 tablet (400 mcg total) by mouth daily. 10/10/21   Rickard Patience, MD  meclizine (ANTIVERT) 25 MG tablet Take 1 tablet (25 mg total) by mouth 3 (three) times daily as needed for dizziness. 05/13/20   Olevia Perches P, DO  Multiple Vitamin (MULTI-VITAMINS) TABS Take 1 tablet by mouth daily.    [provider]  omeprazole (PRILOSEC) 20 MG capsule Take  1 capsule (20 mg total) by mouth daily. 10/02/21   Johnson, Megan P, DO  QUEtiapine (SEROQUEL XR) 50 MG TB24 24 hr tablet Take 1-2 tablets (50-100 mg total) by mouth at bedtime. 10/02/21   Johnson, Megan P, DO  venlafaxine XR (EFFEXOR-XR) 75 MG 24 hr capsule TAKE ONE CAPSULE BY MOUTH DAILY FOR 7 DAYS THEN TAKE TWO CAPSULES BY MOUTH DAILY 12/28/21   Dorcas Carrow, DO    Family History Family History  Problem Relation Age of Onset   Hypertension Mother    Diabetes Mother    Hypertension Brother    Leukemia Maternal Grandmother 40   Diabetes Maternal Grandfather    COPD Maternal Grandfather    Heart disease Maternal Grandfather     Social History Social History   Tobacco Use   Smoking status: Former    Packs/day: 0.50    Years: 2.00    Total pack years: 1.00    Types: Cigarettes    Quit date: 01/05/2003    Years since quitting: 19.0    Smokeless tobacco: Never  Vaping Use   Vaping Use: Never used  Substance Use Topics   Alcohol use: Yes    Comment: once a week, wine   Drug use: No     Allergies   Penicillins, Toradol [ketorolac tromethamine], Wellbutrin [bupropion], and Sulfa antibiotics   Review of Systems Review of Systems Pertinent negatives listed in HPI   Physical Exam Triage Vital Signs ED Triage Vitals  Enc Vitals Group     BP 12/31/21 1011 (!) 144/82     Pulse Rate 12/31/21 1011 81     Resp 12/31/21 1011 18     Temp 12/31/21 1011 98.8 F (37.1 C)     Temp Source 12/31/21 1011 Oral     SpO2 12/31/21 1011 95 %     Weight --      Height --      Head Circumference --      Peak Flow --      Pain Score 12/31/21 1017 7     Pain Loc --      Pain Edu? --      Excl. in GC? --    No data found.  Updated Vital Signs BP (!) 144/82 (BP Location: Left Arm)   Pulse 81   Temp 98.8 F (37.1 C) (Oral)   Resp 18   LMP 12/24/2021   SpO2 95%   Visual Acuity Right Eye Distance:   Left Eye Distance:   Bilateral Distance:    Right Eye Near:   Left Eye Near:    Bilateral Near:     Physical Exam  General Appearance:    Alert, cooperative, no distress  HENT:   Normocephalic, left lower facial swelling, ears normal, nares mucosal edema with congestion, rhinorrhea, oropharynx  clear   Eyes:    PERRL, conjunctiva/corneas clear, EOM's intact       Lungs:     Clear to auscultation bilaterally, respirations unlabored  Heart:    Regular rate and rhythm  Neurologic:   Awake, alert, oriented x 3. No apparent focal neurological           defect.      UC Treatments / Results  Labs (all labs ordered are listed, but only abnormal results are displayed) Labs Reviewed - No data to display  EKG   Radiology No results found.  Procedures Procedures (including critical care time)  Medications Ordered in UC Medications - No data to  display  Initial Impression / Assessment and Plan / UC Course  I have  reviewed the triage vital signs and the nursing notes.  Pertinent labs & imaging results that were available during my care of the patient were reviewed by me and considered in my medical decision making (see chart for details).    Acute non-recurrent sinusitis with mild facial swelling Treatment with Doxycycline and Prednisone . Strict return precautions given if her symptoms do not readily improve or if any of her symptoms worsen. Final Clinical Impressions(s) / UC Diagnoses   Final diagnoses:  Acute non-recurrent sinusitis, unspecified location     Discharge Instructions      If symptoms worsen or do not improve with current treatment or if your facial swelling or pain intensifies after completing the prednisone return for evaluation.     ED Prescriptions     Medication Sig Dispense Auth. Provider   doxycycline (VIBRAMYCIN) 100 MG capsule Take 1 capsule (100 mg total) by mouth 2 (two) times daily. 20 capsule Scot Jun, FNP   predniSONE (DELTASONE) 20 MG tablet Take 1 tablet (20 mg total) by mouth daily with breakfast for 3 days. 3 tablet Scot Jun, FNP      PDMP not reviewed this encounter.   Scot Jun, FNP 12/31/21 1131

## 2021-12-31 NOTE — ED Triage Notes (Signed)
Pt c/o sinus pressure, HA, nasal congestion, and x 1 week.

## 2021-12-31 NOTE — Discharge Instructions (Addendum)
If symptoms worsen or do not improve with current treatment or if your facial swelling or pain intensifies after completing the prednisone return for evaluation.

## 2022-01-10 ENCOUNTER — Other Ambulatory Visit: Payer: 59

## 2022-01-10 ENCOUNTER — Ambulatory Visit: Payer: 59 | Admitting: Oncology

## 2022-01-10 ENCOUNTER — Ambulatory Visit: Payer: 59

## 2022-01-11 ENCOUNTER — Ambulatory Visit: Payer: 59

## 2022-01-11 ENCOUNTER — Ambulatory Visit: Payer: 59 | Admitting: Oncology

## 2022-01-26 ENCOUNTER — Other Ambulatory Visit: Payer: Self-pay | Admitting: Family Medicine

## 2022-01-26 NOTE — Telephone Encounter (Signed)
Requested Prescriptions  Pending Prescriptions Disp Refills  . omeprazole (PRILOSEC) 20 MG capsule [Pharmacy Med Name: OMEPRAZOLE DR 20 MG CAPSULE] 90 capsule 0    Sig: TAKE ONE CAPSULE BY MOUTH DAILY     Gastroenterology: Proton Pump Inhibitors Passed - 01/26/2022  6:59 AM      Passed - Valid encounter within last 12 months    Recent Outpatient Visits          3 months ago Gastroesophageal reflux disease, unspecified whether esophagitis present   [facility] [place], Megan P, DO   7 months ago Pneumonia due to COVID-19 virus   [facility] [place], [place], DO   1 year ago Anxiety and depression   [facility] [place], [place], DO   1 year ago Vertigo   [facility] [place], [place], DO   2 years ago Anxiety and depression   [facility] [place], [place], DO

## 2022-02-13 MED FILL — Iron Sucrose Inj 20 MG/ML (Fe Equiv): INTRAVENOUS | Qty: 10 | Status: AC

## 2022-02-14 ENCOUNTER — Inpatient Hospital Stay: Payer: 59 | Attending: Oncology

## 2022-02-14 ENCOUNTER — Inpatient Hospital Stay: Payer: 59

## 2022-02-14 ENCOUNTER — Inpatient Hospital Stay (HOSPITAL_BASED_OUTPATIENT_CLINIC_OR_DEPARTMENT_OTHER): Payer: 59 | Admitting: Oncology

## 2022-02-14 ENCOUNTER — Encounter: Payer: Self-pay | Admitting: Oncology

## 2022-02-14 VITALS — BP 127/91 | HR 75 | Resp 18 | Wt 229.0 lb

## 2022-02-14 DIAGNOSIS — Z789 Other specified health status: Secondary | ICD-10-CM | POA: Diagnosis not present

## 2022-02-14 DIAGNOSIS — E538 Deficiency of other specified B group vitamins: Secondary | ICD-10-CM

## 2022-02-14 DIAGNOSIS — Z79899 Other long term (current) drug therapy: Secondary | ICD-10-CM | POA: Diagnosis not present

## 2022-02-14 DIAGNOSIS — F1090 Alcohol use, unspecified, uncomplicated: Secondary | ICD-10-CM | POA: Diagnosis not present

## 2022-02-14 DIAGNOSIS — D509 Iron deficiency anemia, unspecified: Secondary | ICD-10-CM | POA: Insufficient documentation

## 2022-02-14 DIAGNOSIS — D508 Other iron deficiency anemias: Secondary | ICD-10-CM | POA: Diagnosis not present

## 2022-02-14 DIAGNOSIS — R7401 Elevation of levels of liver transaminase levels: Secondary | ICD-10-CM

## 2022-02-14 DIAGNOSIS — Z87891 Personal history of nicotine dependence: Secondary | ICD-10-CM | POA: Insufficient documentation

## 2022-02-14 LAB — CBC WITH DIFFERENTIAL/PLATELET
Abs Immature Granulocytes: 0.01 10*3/uL (ref 0.00–0.07)
Basophils Absolute: 0.1 10*3/uL (ref 0.0–0.1)
Basophils Relative: 1 %
Eosinophils Absolute: 0.1 10*3/uL (ref 0.0–0.5)
Eosinophils Relative: 1 %
HCT: 41.1 % (ref 36.0–46.0)
Hemoglobin: 13 g/dL (ref 12.0–15.0)
Immature Granulocytes: 0 %
Lymphocytes Relative: 28 %
Lymphs Abs: 2.3 10*3/uL (ref 0.7–4.0)
MCH: 30.3 pg (ref 26.0–34.0)
MCHC: 31.6 g/dL (ref 30.0–36.0)
MCV: 95.8 fL (ref 80.0–100.0)
Monocytes Absolute: 0.6 10*3/uL (ref 0.1–1.0)
Monocytes Relative: 7 %
Neutro Abs: 5.1 10*3/uL (ref 1.7–7.7)
Neutrophils Relative %: 63 %
Platelets: 249 10*3/uL (ref 150–400)
RBC: 4.29 MIL/uL (ref 3.87–5.11)
RDW: 14.7 % (ref 11.5–15.5)
WBC: 8.1 10*3/uL (ref 4.0–10.5)
nRBC: 0 % (ref 0.0–0.2)

## 2022-02-14 LAB — IRON AND TIBC
Iron: 100 ug/dL (ref 28–170)
Saturation Ratios: 19 % (ref 10.4–31.8)
TIBC: 533 ug/dL — ABNORMAL HIGH (ref 250–450)
UIBC: 433 ug/dL

## 2022-02-14 LAB — FERRITIN: Ferritin: 9 ng/mL — ABNORMAL LOW (ref 11–307)

## 2022-02-14 LAB — FOLATE: Folate: 6.4 ng/mL (ref >5.9)

## 2022-02-15 ENCOUNTER — Telehealth: Payer: Self-pay

## 2022-02-15 ENCOUNTER — Encounter: Payer: Self-pay | Admitting: Oncology

## 2022-02-15 DIAGNOSIS — E538 Deficiency of other specified B group vitamins: Secondary | ICD-10-CM | POA: Insufficient documentation

## 2022-02-15 NOTE — Telephone Encounter (Signed)
I spoke with patient and patient agreed to receiving IV Iron. Patient states she will call back once she knows more about her schedule.     Please schedule IV Iron weekly x3 when she calls back. Thank you.  First Dose is New.

## 2022-02-15 NOTE — Telephone Encounter (Signed)
-----   Message from Rickard Patience, MD sent at 02/15/2022 11:19 AM EDT ----- Ferritin is still low.  Recommend patient to get IV venofer weekly x 3 Keep same follow up appt.   zy

## 2022-02-15 NOTE — Progress Notes (Addendum)
Hematology/Oncology Progress note [phone] [phone]            Patient Care Team: Dorcas Carrow, DO as PCP - General (Family Medicine)  REFERRING PROVIDER: Dorcas Carrow, DO  CHIEF COMPLAINTS/REASON FOR VISIT:  iron deficiency anemia  HISTORY OF PRESENTING ILLNESS:   Amber [name] is a  41 y.o.  female with PMH listed below was seen in consultation at the request of  Dorcas Carrow, DO  for evaluation of iron deficiency anemia  10/02/2021, patient had a CBC done which showed hemoglobin 8.4, MCV 72.  Normal white count and platelet count. Reviewed her previous history, patient has a chronic history of anemia, microcytic, since at least 2017. It was felt that patient may have iron deficiency anemia and she was referred to hematology for further evaluation and treatments. Patient reports feeling fatigued.  Craving for ice chips. Menstrual period flow is normal, usually 5 days, 2 to 3 days is heavy.  Denies any melena, bloody stool, abdominal pain, unintentional weight loss.  She drinks alcohol once per week.  She drinks wine   INTERVAL HISTORY Amber [name] is a 41 y.o. female who has above history reviewed by me today presents for follow up visit for management of IDA She tolerated IV venofer infusions.  Fatigue has improved.   . Review of Systems  Constitutional:  Negative for appetite change, chills, fatigue and fever.  HENT:   Negative for hearing loss and voice change.   Eyes:  Negative for eye problems.  Respiratory:  Negative for chest tightness and cough.   Cardiovascular:  Negative for chest pain.  Gastrointestinal:  Negative for abdominal distention, abdominal pain and blood in stool.  Endocrine: Negative for hot flashes.  Genitourinary:  Negative for difficulty urinating and frequency.   Musculoskeletal:  Negative for arthralgias.  Skin:  Negative for itching and rash.  Neurological:  Negative for extremity weakness.   Hematological:  Negative for adenopathy.  Psychiatric/Behavioral:  Negative for confusion.       MEDICAL HISTORY:  Past Medical History:  Diagnosis Date   Anemia    Anxiety    Congenital malrotation of intestine    Depression    GERD (gastroesophageal reflux disease)    OCC   Headache    H/O   IDA (iron deficiency anemia) 10/10/2021   Obesity (BMI 30-39.9)    OSA (obstructive sleep apnea)    USE CPAP    SURGICAL HISTORY: Past Surgical History:  Procedure Laterality Date   BREAST REDUCTION SURGERY  12/18/02   CESAREAN SECTION     X 2   CESAREAN SECTION WITH BILATERAL TUBAL LIGATION N/A 08/04/2015   Procedure: CESAREAN SECTION WITH BILATERAL TUBAL LIGATION;  Surgeon: Nadara Mustard, MD;  Location: [facility] ORS;  Service: Obstetrics;  Laterality: N/A;   LAPAROSCOPIC CHOLECYSTECTOMY W/ CHOLANGIOGRAPHY  01/22/11   LESION REMOVAL N/A 05/04/2016   Procedure: EXCISION VAGINAL LESION;  Surgeon: Nadara Mustard, MD;  Location: [facility] ORS;  Service: Gynecology;  Laterality: N/A;   NASAL SINUS SURGERY  05/26/03   SLEEVE GASTROPLASTY     TUBAL LIGATION      SOCIAL HISTORY: Social History   Socioeconomic History   Marital status: Married    Spouse name: Not on file   Number of children: Not on file   Years of education: Not on file   Highest education level: Not on file  Occupational History   Not on file  Tobacco Use   Smoking  status: Former    Packs/day: 0.50    Years: 2.00    Total pack years: 1.00    Types: Cigarettes    Quit date: 01/05/2003    Years since quitting: 19.1   Smokeless tobacco: Never  Vaping Use   Vaping Use: Never used  Substance and Sexual Activity   Alcohol use: Yes    Comment: once a week, wine   Drug use: No   Sexual activity: Yes    Birth control/protection: Surgical  Other Topics Concern   Not on file  Social History Narrative   Married.   2 children last kid had in 2017    Works in Clinical biochemist.   Enjoys spending time with family.    Social Determinants of Health   Financial Resource Strain: Not on file  Food Insecurity: Not on file  Transportation Needs: Not on file  Physical Activity: Insufficiently Active (05/27/2017)   Exercise Vital Sign    Days of Exercise per Week: 2 days    Minutes of Exercise per Session: 30 min  Stress: Stress Concern Present (05/27/2017)   [employer] of Occupational Health - Occupational Stress Questionnaire    Feeling of Stress : Rather much  Social Connections: Moderately Integrated (05/27/2017)   Social Connection and Isolation Panel [NHANES]    Frequency of Communication with Friends and Family: More than three times a week    Frequency of Social Gatherings with Friends and Family: Once a week    Attends Religious Services: More than 4 times per year    Active Member of [employer] or Organizations: No    Attends Banker Meetings: Never    Marital Status: Married  Catering manager Violence: Not At Risk (05/27/2017)   Humiliation, Afraid, Rape, and Kick questionnaire    Fear of Current or Ex-Partner: No    Emotionally Abused: No    Physically Abused: No    Sexually Abused: No    FAMILY HISTORY: Family History  Problem Relation Age of Onset   Hypertension Mother    Diabetes Mother    Hypertension Brother    Leukemia Maternal Grandmother 40   Diabetes Maternal Grandfather    COPD Maternal Grandfather    Heart disease Maternal Grandfather     ALLERGIES:  is allergic to penicillins, toradol [ketorolac tromethamine], wellbutrin [bupropion], and sulfa antibiotics.  MEDICATIONS:  Current Outpatient Medications  Medication Sig Dispense Refill   meclizine (ANTIVERT) 25 MG tablet Take 1 tablet (25 mg total) by mouth 3 (three) times daily as needed for dizziness. 90 tablet 2   Multiple Vitamin (MULTI-VITAMINS) TABS Take 1 tablet by mouth daily.     QUEtiapine (SEROQUEL XR) 50 MG TB24 24 hr tablet Take 1-2 tablets (50-100 mg total) by mouth at bedtime. 180 tablet 1    venlafaxine XR (EFFEXOR-XR) 75 MG 24 hr capsule TAKE ONE CAPSULE BY MOUTH DAILY FOR 7 DAYS THEN TAKE TWO CAPSULES BY MOUTH DAILY 60 capsule 2   doxycycline (VIBRAMYCIN) 100 MG capsule Take 1 capsule (100 mg total) by mouth 2 (two) times daily. (Patient not taking: Reported on 02/14/2022) 20 capsule 0   folic acid (V-R FOLIC ACID) 400 MCG tablet Take 1 tablet (400 mcg total) by mouth daily. (Patient not taking: Reported on 02/14/2022) 90 tablet 0   omeprazole (PRILOSEC) 20 MG capsule TAKE ONE CAPSULE BY MOUTH DAILY (Patient not taking: Reported on 02/14/2022) 90 capsule 0   No current facility-administered medications for this visit.     PHYSICAL EXAMINATION:  ECOG PERFORMANCE STATUS: 0 - Asymptomatic Vitals:   02/14/22 1426  BP: (!) 127/91  Pulse: 75  Resp: 18  SpO2: 99%   Filed Weights   02/14/22 1424  Weight: 229 lb (103.9 kg)    Physical Exam Constitutional:      General: She is not in acute distress.    Appearance: She is obese.  HENT:     Head: Normocephalic and atraumatic.  Eyes:     General: No scleral icterus. Cardiovascular:     Rate and Rhythm: Normal rate and regular rhythm.     Heart sounds: Normal heart sounds.  Pulmonary:     Effort: Pulmonary effort is normal. No respiratory distress.     Breath sounds: No wheezing.  Abdominal:     General: Bowel sounds are normal. There is no distension.     Palpations: Abdomen is soft.  Musculoskeletal:        General: No deformity. Normal range of motion.     Cervical back: Normal range of motion and neck supple.  Skin:    General: Skin is warm and dry.     Findings: No erythema or rash.  Neurological:     Mental Status: She is alert and oriented to person, place, and time. Mental status is at baseline.     Cranial Nerves: No cranial nerve deficit.     Coordination: Coordination normal.  Psychiatric:        Mood and Affect: Mood normal.     LABORATORY DATA:  I have reviewed the data as listed    Latest Ref  Rng & Units 02/14/2022    2:06 PM 10/10/2021    4:02 PM 10/02/2021    4:31 PM  CBC  WBC 4.0 - 10.5 K/uL 8.1  8.0  7.7   Hemoglobin 12.0 - 15.0 g/dL 67.8  9.2  8.4   Hematocrit 36.0 - 46.0 % 41.1  33.3  29.9   Platelets 150 - 400 K/uL 249  305  272       Latest Ref Rng & Units 10/02/2021    4:31 PM 07/23/2019    5:04 PM 06/30/2018   10:33 AM  CMP  Glucose 70 - 99 mg/dL 83  92  78   BUN 6 - 24 mg/dL 10  13  13    Creatinine 0.57 - 1.00 mg/dL  9.38  1.01   Sodium 134 - 144 mmol/L 141  138  138   Potassium 3.5 - 5.2 mmol/L 3.9  4.1  4.1   Chloride 96 - 106 mmol/L 103  99  101   CO2 20 - 29 mmol/L 24  23  21    Calcium 8.7 - 10.2 mg/dL 8.8  9.7  9.1   Total Protein 6.0 - 8.5 g/dL 6.9  7.4  6.9   Total Bilirubin 0.0 - 1.2 mg/dL 0.6  0.4  0.4   Alkaline Phos 44 - 121 IU/L 62  59  44   AST 0 - 40 IU/L 73  25  23   ALT 0 - 32 IU/L 67  41  25    Lab Results  Component Value Date   IRON 100 02/14/2022   TIBC 533 (H) 02/14/2022   FERRITIN 9 (L) 02/14/2022        RADIOGRAPHIC STUDIES: I have personally reviewed the radiological images as listed and agreed with the findings in the report. No results found.    ASSESSMENT & PLAN:  1. Other iron deficiency anemia  2. Alcohol use   3. Transaminitis   4. Folate deficiency    #Iron deficiency anemia S/p IV venofer treatments.  Labs are reviewed and discussed with patient. Normal hemoglobin and persistently low iron panel.  Recommend IV venofer weekly x 3  + Tissue Transglutaminase Ab Recommend GI evaluation   #Folate deficiency, folate level improved #Transaminitis, hepatitis C negative.  This is likely due to alcohol use.  We discussed about cutting back on alcohol consumption. GI evaluation.   I recommend patient to follow-up in 6 months.  Labs  prior to MD +/- Venofer. Orders Placed This Encounter  Procedures   CBC with Differential/Platelet    Standing Status:   Future    Standing Expiration Date:   02/15/2023    Ferritin    Standing Status:   Future    Standing Expiration Date:   02/15/2023   Iron and TIBC    Standing Status:   Future    Standing Expiration Date:   02/15/2023   Vitamin B12    Standing Status:   Future    Standing Expiration Date:   02/15/2023   Folate    Standing Status:   Future    Standing Expiration Date:   02/15/2023    All questions were answered. The patient knows to call the clinic with any problems questions or concerns.  cc Dorcas Carrow, DO   Rickard Patience, MD, PhD [facility] Hematology Oncology 02/15/2022

## 2022-02-16 NOTE — Telephone Encounter (Signed)
Please reach out to pt to see if she is ready to schedule.

## 2022-02-19 ENCOUNTER — Ambulatory Visit (INDEPENDENT_AMBULATORY_CARE_PROVIDER_SITE_OTHER): Payer: 59 | Admitting: Gastroenterology

## 2022-02-19 ENCOUNTER — Encounter: Payer: Self-pay | Admitting: Gastroenterology

## 2022-02-19 ENCOUNTER — Other Ambulatory Visit: Payer: Self-pay

## 2022-02-19 VITALS — BP 148/86 | HR 74 | Temp 98.6°F | Ht 61.5 in | Wt 229.6 lb

## 2022-02-19 DIAGNOSIS — R7989 Other specified abnormal findings of blood chemistry: Secondary | ICD-10-CM

## 2022-02-19 DIAGNOSIS — D508 Other iron deficiency anemias: Secondary | ICD-10-CM

## 2022-02-19 DIAGNOSIS — N920 Excessive and frequent menstruation with regular cycle: Secondary | ICD-10-CM

## 2022-02-19 DIAGNOSIS — R768 Other specified abnormal immunological findings in serum: Secondary | ICD-10-CM | POA: Diagnosis not present

## 2022-02-19 DIAGNOSIS — F1010 Alcohol abuse, uncomplicated: Secondary | ICD-10-CM

## 2022-02-19 MED ORDER — NA SULFATE-K SULFATE-MG SULF 17.5-3.13-1.6 GM/177ML PO SOLN: 354.0000 mL | Freq: Once | ORAL | 0 refills | Status: AC

## 2022-02-19 NOTE — Patient Instructions (Signed)
[facility] at 7:45 AM. Please do not eat or drink after midnight.

## 2022-02-19 NOTE — Progress Notes (Unsigned)
Wyline Mood MD, MRCP(U.K) [address]  [address]  [place], [place] [address]  Main: [phone]  Fax: [phone]   Gastroenterology Consultation  Referring Provider:     Dorcas Carrow, DO Primary Care Physician:  Dorcas Carrow, DO Primary Gastroenterologist:  Dr. Wyline Mood  Reason for Consultation:     Abnormal LFTs, iron deficiency, transaminitis  HPI:   Amber [name] is a 41 y.o. y/o female follows with Dr. Cathie Hoops in hematology last seen on 02/14/2022 for iron deficiency anemia on IV iron.  Had a positive driver showed transglutaminase antibody and recommended GI evaluation found to have elevated LFTs attributed to alcohol use but referred to see [place] for further evaluation.  02/14/2022 hemoglobin 13 g ferritin 9 celiac panel demonstrated positive TTG IgA, B12 levels normal, hepatitis B core antibody surface antigen hepatitis C antibody negative.  AST 73 and ALT 67 in April 2023 no recent ultrasound.  Denies any significant upper GI symptoms.  Has had on and off diarrhea no family history of celiac disease very significant history of heavy.  Lasting about 5 days changing a combination of pads and tampons multiple times during the day not seen a GYN recently drinks 3-4 drinks a day for some years.  Trying to cut down.  Denies any NSAID use Past Surgical History:  Procedure Laterality Date   BREAST REDUCTION SURGERY  12/18/02   CESAREAN SECTION     X 2   CESAREAN SECTION WITH BILATERAL TUBAL LIGATION N/A 08/04/2015   Procedure: CESAREAN SECTION WITH BILATERAL TUBAL LIGATION;  Surgeon: Nadara Mustard, MD;  Location: [facility] ORS;  Service: Obstetrics;  Laterality: N/A;   LAPAROSCOPIC CHOLECYSTECTOMY W/ CHOLANGIOGRAPHY  01/22/11   LESION REMOVAL N/A 05/04/2016   Procedure: EXCISION VAGINAL LESION;  Surgeon: Nadara Mustard, MD;  Location: [facility] ORS;  Service: Gynecology;  Laterality: N/A;   NASAL SINUS SURGERY  05/26/03   SLEEVE GASTROPLASTY     TUBAL LIGATION      Prior to  Admission medications   Medication Sig Start Date End Date Taking? Authorizing Provider  doxycycline (VIBRAMYCIN) 100 MG capsule Take 1 capsule (100 mg total) by mouth 2 (two) times daily. Patient not taking: Reported on 02/14/2022 12/31/21   Bing Neighbors, FNP  folic acid (V-R FOLIC ACID) 400 MCG tablet Take 1 tablet (400 mcg total) by mouth daily. Patient not taking: Reported on 02/14/2022 10/10/21   Rickard Patience, MD  meclizine (ANTIVERT) 25 MG tablet Take 1 tablet (25 mg total) by mouth 3 (three) times daily as needed for dizziness. 05/13/20   Olevia Perches P, DO  Multiple Vitamin (MULTI-VITAMINS) TABS Take 1 tablet by mouth daily.    [provider]  omeprazole (PRILOSEC) 20 MG capsule TAKE ONE CAPSULE BY MOUTH DAILY Patient not taking: Reported on 02/14/2022 01/26/22   Olevia Perches P, DO  QUEtiapine (SEROQUEL XR) 50 MG TB24 24 hr tablet Take 1-2 tablets (50-100 mg total) by mouth at bedtime. 10/02/21   Johnson, Megan P, DO  venlafaxine XR (EFFEXOR-XR) 75 MG 24 hr capsule TAKE ONE CAPSULE BY MOUTH DAILY FOR 7 DAYS THEN TAKE TWO CAPSULES BY MOUTH DAILY 12/28/21   Dorcas Carrow, DO    Family History  Problem Relation Age of Onset   Hypertension Mother    Diabetes Mother    Hypertension Brother    Leukemia Maternal Grandmother 40   Diabetes Maternal Grandfather    COPD Maternal Grandfather    Heart disease Maternal  Grandfather      Social History   Tobacco Use   Smoking status: Former    Packs/day: 0.50    Years: 2.00    Total pack years: 1.00    Types: Cigarettes    Quit date: 01/05/2003    Years since quitting: 19.1   Smokeless tobacco: Never  Vaping Use   Vaping Use: Never used  Substance Use Topics   Alcohol use: Yes    Comment: once a week, wine   Drug use: No    Allergies as of 02/19/2022 - Review Complete 02/19/2022  Allergen Reaction Noted   Penicillins Hives and Swelling 01/05/2011   Toradol [ketorolac tromethamine] Hives 10/03/2018   Wellbutrin  [bupropion] Other (See Comments) 10/22/2016   Sulfa antibiotics Hives and Swelling 01/05/2011    Review of Systems:    All systems reviewed and negative except where noted in HPI.   Physical Exam:  BP (!) 148/86   Pulse 74   Temp 98.6 F (37 C) (Oral)   Ht 5' 1.5" (1.562 m)   Wt 229 lb 9.6 oz (104.1 kg)   BMI 42.68 kg/m  No LMP recorded. Psych:  Alert and cooperative. Normal mood and affect. General:   Alert,  Well-developed, well-nourished, pleasant and cooperative in NAD Head:  Normocephalic and atraumatic. Eyes:  Sclera clear, no icterus.   Conjunctiva pink. Ears:  Normal auditory acuity. Neurologic:  Alert and oriented x3;  grossly normal neurologically. Psych:  Alert and cooperative. Normal mood and affect.  Imaging Studies: No results found.  Assessment and Plan:   Amber [name] is a 41 y.o. y/o female has been referred for LFTs, positive tissue transglutaminase antibody which was found on evaluation for microcytic iron deficiency anemia by Dr. Cathie Hoops who is on IV iron with normalization of hemoglobin at this point of time.  History of menorrhagia.   Plan 1.  EGD to evaluate for celiac disease with a positive tissue transglutaminase antibody, will perform colonoscopy at the same time due to the anemia.  Also take biopsies to rule out microscopic colitis 2.  Abnormal LFTs may be due to consumption of alcohol or also from celiac disease.  Stop all alcohol consumption full autoimmune and viral hepatitis work-up right upper quadrant ultrasound.  Menorrhagia 3.  Very likely anemia secondary to menorrhagia we will refer her to GYN for evaluation  I have discussed alternative options, risks & benefits,  which include, but are not limited to, bleeding, infection, perforation,respiratory complication & drug reaction.  The patient agrees with this plan & written consent will be obtained.     Follow up in 3 months  Dr Wyline Mood MD,MRCP(U.K)

## 2022-02-21 ENCOUNTER — Telehealth: Payer: Self-pay

## 2022-02-21 NOTE — Progress Notes (Signed)
Needs Hep A vaccine

## 2022-02-21 NOTE — Telephone Encounter (Signed)
-----   Message from Wyline Mood, MD sent at 02/21/2022  8:37 AM EDT ----- Needs Hep A vaccine

## 2022-02-21 NOTE — Progress Notes (Signed)
Celiac serology positive, mitocondrial ab also positive. Await EGD , will repeat mitocondrial antibody once off all alcohol in 4-5 months

## 2022-02-22 ENCOUNTER — Ambulatory Visit
Admission: RE | Admit: 2022-02-22 | Discharge: 2022-02-22 | Disposition: A | Discharge status: Home / Self Care | Payer: 59 | Source: Ambulatory Visit | Attending: Gastroenterology | Admitting: Gastroenterology

## 2022-02-22 DIAGNOSIS — R7989 Other specified abnormal findings of blood chemistry: Secondary | ICD-10-CM | POA: Insufficient documentation

## 2022-02-23 ENCOUNTER — Encounter: Payer: Self-pay | Admitting: Gastroenterology

## 2022-02-28 LAB — CERULOPLASMIN: Ceruloplasmin: 26.1 mg/dL (ref 19.0–39.0)

## 2022-02-28 LAB — HEPATITIS A ANTIBODY, TOTAL: hep A Total Ab: NEGATIVE

## 2022-02-28 LAB — IMMUNOGLOBULINS A/E/G/M, SERUM
IgA/Immunoglobulin A, Serum: 260 mg/dL (ref 87–352)
IgE (Immunoglobulin E), Serum: 9 IU/mL (ref 6–495)
IgG (Immunoglobin G), Serum: 978 mg/dL (ref 586–1602)
IgM (Immunoglobulin M), Srm: 253 mg/dL — ABNORMAL HIGH (ref 26–217)

## 2022-02-28 LAB — ANTI-MICROSOMAL ANTIBODY LIVER / KIDNEY: LKM1 Ab: 1.5 Units (ref 0.0–20.0)

## 2022-02-28 LAB — MITOCHONDRIAL/SMOOTH MUSCLE AB PNL
Mitochondrial Ab: 22.2 Units — ABNORMAL HIGH (ref 0.0–20.0)
Smooth Muscle Ab: 5 Units (ref 0–19)

## 2022-02-28 LAB — IRON,TIBC AND FERRITIN PANEL
Ferritin: 16 ng/mL (ref 15–150)
Iron Saturation: 15 % (ref 15–55)
Iron: 68 ug/dL (ref 27–159)
Total Iron Binding Capacity: 452 ug/dL — ABNORMAL HIGH (ref 250–450)
UIBC: 384 ug/dL (ref 131–425)

## 2022-02-28 LAB — CELIAC DISEASE AB SCREEN W/RFX
Antigliadin Abs, IgA: 5 units (ref 0–19)
Transglutaminase IgA: 4 U/mL — ABNORMAL HIGH (ref 0–3)

## 2022-02-28 LAB — ANA: Anti Nuclear Antibody (ANA): NEGATIVE

## 2022-02-28 LAB — HEPATITIS B E ANTIBODY: Hep B E Ab: NEGATIVE

## 2022-02-28 LAB — ALPHA-1-ANTITRYPSIN: A-1 Antitrypsin: 156 mg/dL (ref 101–187)

## 2022-02-28 LAB — HEPATITIS B E ANTIGEN: Hep B E Ag: NEGATIVE

## 2022-02-28 LAB — CK: Total CK: 57 U/L (ref 32–182)

## 2022-02-28 LAB — HIV ANTIBODY (ROUTINE TESTING W REFLEX): HIV Screen 4th Generation wRfx: NONREACTIVE

## 2022-02-28 LAB — HEPATITIS B SURFACE ANTIBODY,QUALITATIVE: Hep B Surface Ab, Qual: REACTIVE

## 2022-02-28 LAB — ENDOMYSIAL IGA ANTIBODY: Endomysial IgA: NEGATIVE

## 2022-03-09 ENCOUNTER — Telehealth: Payer: Self-pay | Admitting: Obstetrics & Gynecology

## 2022-03-09 NOTE — Telephone Encounter (Signed)
[facility] referring for evaluate and treat for menorrhagia. I contacted patient via phone. I left voicemail for patient to call back to be scheduled.

## 2022-03-15 ENCOUNTER — Encounter: Payer: Self-pay | Admitting: Obstetrics and Gynecology

## 2022-03-15 NOTE — Telephone Encounter (Signed)
I contacted patient via phone. I left voicemail for patient to call back to be scheduled.  Referral notice to be mail. Multiple attempts to reach patient have been unsuccessful.

## 2022-04-02 ENCOUNTER — Encounter: Payer: Self-pay | Admitting: Gastroenterology

## 2022-04-02 ENCOUNTER — Encounter
Admission: RE | Disposition: A | Discharge status: Home / Self Care | Payer: Self-pay | Source: Home / Self Care | Attending: Gastroenterology

## 2022-04-02 ENCOUNTER — Ambulatory Visit: Payer: 59 | Admitting: Registered Nurse

## 2022-04-02 ENCOUNTER — Ambulatory Visit
Admission: RE | Admit: 2022-04-02 | Discharge: 2022-04-02 | Disposition: A | Discharge status: Home / Self Care | Payer: 59 | Attending: Gastroenterology | Admitting: Gastroenterology

## 2022-04-02 DIAGNOSIS — D508 Other iron deficiency anemias: Secondary | ICD-10-CM | POA: Diagnosis not present

## 2022-04-02 DIAGNOSIS — G4730 Sleep apnea, unspecified: Secondary | ICD-10-CM | POA: Insufficient documentation

## 2022-04-02 DIAGNOSIS — R894 Abnormal immunological findings in specimens from other organs, systems and tissues: Secondary | ICD-10-CM | POA: Diagnosis not present

## 2022-04-02 DIAGNOSIS — E669 Obesity, unspecified: Secondary | ICD-10-CM | POA: Diagnosis not present

## 2022-04-02 DIAGNOSIS — K2100 Gastro-esophageal reflux disease with esophagitis, without bleeding: Secondary | ICD-10-CM | POA: Insufficient documentation

## 2022-04-02 DIAGNOSIS — F419 Anxiety disorder, unspecified: Secondary | ICD-10-CM | POA: Diagnosis not present

## 2022-04-02 DIAGNOSIS — R519 Headache, unspecified: Secondary | ICD-10-CM | POA: Insufficient documentation

## 2022-04-02 DIAGNOSIS — Z6841 Body Mass Index (BMI) 40.0 and over, adult: Secondary | ICD-10-CM | POA: Diagnosis not present

## 2022-04-02 DIAGNOSIS — G4733 Obstructive sleep apnea (adult) (pediatric): Secondary | ICD-10-CM | POA: Insufficient documentation

## 2022-04-02 DIAGNOSIS — F32A Depression, unspecified: Secondary | ICD-10-CM | POA: Insufficient documentation

## 2022-04-02 DIAGNOSIS — K449 Diaphragmatic hernia without obstruction or gangrene: Secondary | ICD-10-CM | POA: Insufficient documentation

## 2022-04-02 DIAGNOSIS — D509 Iron deficiency anemia, unspecified: Secondary | ICD-10-CM | POA: Insufficient documentation

## 2022-04-02 DIAGNOSIS — Z87891 Personal history of nicotine dependence: Secondary | ICD-10-CM | POA: Insufficient documentation

## 2022-04-02 DIAGNOSIS — R197 Diarrhea, unspecified: Secondary | ICD-10-CM | POA: Insufficient documentation

## 2022-04-02 HISTORY — PX: ESOPHAGOGASTRODUODENOSCOPY: SHX5428

## 2022-04-02 HISTORY — PX: COLONOSCOPY WITH PROPOFOL: SHX5780

## 2022-04-02 LAB — POCT PREGNANCY, URINE: Preg Test, Ur: NEGATIVE

## 2022-04-02 SURGERY — COLONOSCOPY WITH PROPOFOL
Anesthesia: General

## 2022-04-02 MED ORDER — SODIUM CHLORIDE 0.9 % IV SOLN: INTRAVENOUS | Status: DC

## 2022-04-02 MED ORDER — STERILE WATER FOR IRRIGATION IR SOLN
Status: DC | PRN
  Administered 2022-04-02: 60 mL

## 2022-04-02 MED ORDER — LIDOCAINE HCL (CARDIAC) PF 100 MG/5ML IV SOSY
PREFILLED_SYRINGE | INTRAVENOUS | Status: DC | PRN
  Administered 2022-04-02: 40 mg via INTRAVENOUS

## 2022-04-02 MED ORDER — PROPOFOL 500 MG/50ML IV EMUL
INTRAVENOUS | Status: DC | PRN
  Administered 2022-04-02: 175 ug/kg/min via INTRAVENOUS

## 2022-04-02 MED ORDER — GLYCOPYRROLATE 0.2 MG/ML IJ SOLN
INTRAMUSCULAR | Status: DC | PRN
  Administered 2022-04-02: .2 mg via INTRAVENOUS

## 2022-04-02 MED ORDER — PROPOFOL 10 MG/ML IV BOLUS
INTRAVENOUS | Status: AC
  Filled 2022-04-02: qty 20

## 2022-04-02 MED ORDER — PROPOFOL 10 MG/ML IV BOLUS
INTRAVENOUS | Status: DC | PRN
  Administered 2022-04-02: 100 mg via INTRAVENOUS

## 2022-04-02 MED ORDER — DEXMEDETOMIDINE HCL IN NACL 200 MCG/50ML IV SOLN
INTRAVENOUS | Status: DC | PRN
  Administered 2022-04-02: 8 ug via INTRAVENOUS

## 2022-04-02 NOTE — Op Note (Signed)
[facility] Patient Name: Amber [name] Procedure Date: 04/02/2022 8:54 AM MRN: [id number] Account #: [id number] Date of Birth: 1981-01-09 Admit Type: Outpatient Age: 41 Room: [facility] ENDO ROOM 1 Gender: Female Note Status: Finalized Instrument Name: Upper Endoscope [id number] Procedure:             Upper GI endoscopy Indications:           Endoscopy to assess diarrhea in patient suspected of                         having celiac disease Providers:             Jonathon Bellows MD, MD Medicines:             Monitored Anesthesia Care Complications:         No immediate complications. Procedure:             Pre-Anesthesia Assessment:                        - Prior to the procedure, a History and Physical was                         performed, and patient medications, allergies and                         sensitivities were reviewed. The patient's tolerance                         of previous anesthesia was reviewed.                        - The risks and benefits of the procedure and the                         sedation options and risks were discussed with the                         patient. All questions were answered and informed                         consent was obtained.                        - ASA Grade Assessment: II - A patient with mild                         systemic disease.                        After obtaining informed consent, the endoscope was                         passed under direct vision. Throughout the procedure,                         the patient's blood pressure, pulse, and oxygen                         saturations were monitored continuously. The Endoscope  was introduced through the mouth, and advanced to the                         third part of duodenum. The upper GI endoscopy was                         accomplished with ease. The patient tolerated the                         procedure well. Findings:       LA Grade A (one or more mucosal breaks less than 5 mm, not extending       between tops of 2 mucosal folds) esophagitis with no bleeding was found       in the lower third of the esophagus.      A small hiatal hernia was present.      The cardia and gastric fundus were normal on retroflexion.      The examined duodenum was normal. Biopsies for histology were taken with       a cold forceps for evaluation of celiac disease. Impression:            - LA Grade A reflux esophagitis with no bleeding.                        - Small hiatal hernia.                        - Normal examined duodenum. Biopsied. Recommendation:        - Await pathology results.                        - Use Pepcid (famotidine) 20 mg PO BID for 3 months. Procedure Code(s):     --- Professional ---                        [phone], Esophagogastroduodenoscopy, flexible,                         transoral; with biopsy, single or multiple Diagnosis Code(s):     --- Professional ---                        K21.00, Gastro-esophageal reflux disease with                         esophagitis, without bleeding                        K44.9, Diaphragmatic hernia without obstruction or                         gangrene                        R19.7, Diarrhea, unspecified CPT copyright 2019 American Medical Association. All rights reserved. The codes documented in this report are preliminary and upon coder review may  be revised to meet current compliance requirements. Wyline Mood, MD Wyline Mood MD, MD 04/02/2022 9:11:02 AM This report has been signed electronically. Number of Addenda: 0 Note Initiated On: 04/02/2022 8:54 AM Estimated Blood Loss:  Estimated blood loss:  none.      [facility]

## 2022-04-02 NOTE — Transfer of Care (Signed)
Immediate Anesthesia Transfer of Care Note  Patient: Amber [name]  Procedure(s) Performed: Procedure(s): COLONOSCOPY WITH PROPOFOL (N/A) ESOPHAGOGASTRODUODENOSCOPY (EGD) (N/A)  Patient Location: PACU and Endoscopy Unit  Anesthesia Type:General  Level of Consciousness: sedated  Airway & Oxygen Therapy: Patient Spontanous Breathing and Patient connected to nasal cannula oxygen  Post-op Assessment: Report given to RN and Post -op Vital signs reviewed and stable  Post vital signs: Reviewed and stable  Last Vitals:  Vitals:   04/02/22 0841 04/02/22 0925  BP: 128/76 105/65  Pulse: 78 74  Resp: 18 16  Temp: (!) 36.1 C   SpO2: 568% 12%    Complications: No apparent anesthesia complications

## 2022-04-02 NOTE — Anesthesia Procedure Notes (Signed)
Date/Time: 04/02/2022 9:07 AM  Performed by: Doreen Salvage, CRNAPre-anesthesia Checklist: Patient identified, Emergency Drugs available, Suction available and Patient being monitored Patient Re-evaluated:Patient Re-evaluated prior to induction Oxygen Delivery Method: Nasal cannula Induction Type: IV induction Dental Injury: Teeth and Oropharynx as per pre-operative assessment  Comments: Nasal cannula with etCO2 monitoring

## 2022-04-02 NOTE — Anesthesia Preprocedure Evaluation (Signed)
Anesthesia Evaluation  Patient identified by MRN, date of birth, ID band Patient awake    Reviewed: Allergy & Precautions, NPO status , Patient's Chart, lab work & pertinent test results  Airway Mallampati: II  TM Distance: >3 FB Neck ROM: Full    Dental  (+) Teeth Intact   Pulmonary neg pulmonary ROS, sleep apnea and Continuous Positive Airway Pressure Ventilation , former smoker,    Pulmonary exam normal breath sounds clear to auscultation + decreased breath sounds      Cardiovascular Exercise Tolerance: Good negative cardio ROS Normal cardiovascular exam Rhythm:Regular Rate:Normal     Neuro/Psych  Headaches, Anxiety Depression negative neurological ROS  negative psych ROS   GI/Hepatic negative GI ROS, Neg liver ROS, GERD  Medicated,  Endo/Other  negative endocrine ROS  Renal/GU negative Renal ROS  negative genitourinary   Musculoskeletal   Abdominal (+) + obese,   Peds negative pediatric ROS (+)  Hematology negative hematology ROS (+) Blood dyscrasia, anemia ,   Anesthesia Other Findings Past Medical History: No date: Anemia No date: Anxiety No date: Congenital malrotation of intestine No date: Depression No date: GERD (gastroesophageal reflux disease)     Comment:  OCC No date: Headache     Comment:  H/O 10/10/2021: IDA (iron deficiency anemia) No date: Obesity (BMI 30-39.9) No date: OSA (obstructive sleep apnea)     Comment:  USE CPAP  Past Surgical History: 12/18/02: BREAST REDUCTION SURGERY No date: CESAREAN SECTION     Comment:  X 2 08/04/2015: CESAREAN SECTION WITH BILATERAL TUBAL LIGATION; N/A     Comment:  Procedure: CESAREAN SECTION WITH BILATERAL TUBAL               LIGATION;  Surgeon: Gae Dry, MD;  Location: [facility]               ORS;  Service: Obstetrics;  Laterality: N/A; 01/22/11: LAPAROSCOPIC CHOLECYSTECTOMY W/ CHOLANGIOGRAPHY 05/04/2016: LESION REMOVAL; N/A     Comment:  Procedure:  EXCISION VAGINAL LESION;  Surgeon: Gae Dry, MD;  Location: [facility] ORS;  Service: Gynecology;                Laterality: N/A; 05/26/03: NASAL SINUS SURGERY No date: SLEEVE GASTROPLASTY No date: TUBAL LIGATION  BMI    Body Mass Index: 42.75 kg/m      Reproductive/Obstetrics negative OB ROS                             Anesthesia Physical Anesthesia Plan  ASA: 3  Anesthesia Plan: General   Post-op Pain Management:    Induction: Intravenous  PONV Risk Score and Plan: Propofol infusion and TIVA  Airway Management Planned: Natural Airway  Additional Equipment:   Intra-op Plan:   Post-operative Plan:   Informed Consent: I have reviewed the patients History and Physical, chart, labs and discussed the procedure including the risks, benefits and alternatives for the proposed anesthesia with the patient or authorized representative who has indicated his/her understanding and acceptance.     Dental Advisory Given  Plan Discussed with: CRNA and Surgeon  Anesthesia Plan Comments:         Anesthesia Quick Evaluation

## 2022-04-02 NOTE — Anesthesia Postprocedure Evaluation (Signed)
Anesthesia Post Note  Patient: Amber [name]  Procedure(s) Performed: COLONOSCOPY WITH PROPOFOL ESOPHAGOGASTRODUODENOSCOPY (EGD)  Patient location during evaluation: PACU Anesthesia Type: General Level of consciousness: awake and oriented Pain management: pain level controlled Vital Signs Assessment: post-procedure vital signs reviewed and stable Respiratory status: spontaneous breathing and respiratory function stable Cardiovascular status: stable Anesthetic complications: no   No notable events documented.   Last Vitals:  Vitals:   04/02/22 0841 04/02/22 0925  BP: 128/76 105/65  Pulse: 78 74  Resp: 18 16  Temp: (!) 36.1 C   SpO2: 100% 98%    Last Pain:  Vitals:   04/02/22 0925  TempSrc:   PainSc: 0-No pain                 VAN STAVEREN,Maanav Kassabian

## 2022-04-02 NOTE — H&P (Signed)
Jonathon Bellows, MD [address], [place], [place], [place], [address] [address], [place], [place], [place], [address] Phone: [phone]  Fax: [phone]  Primary Care Physician:  Valerie Roys, DO   Pre-Procedure History & Physical: HPI:  Amber [name] is a 41 y.o. female is here for an endoscopy and colonoscopy    Past Medical History:  Diagnosis Date   Anemia    Anxiety    Congenital malrotation of intestine    Depression    GERD (gastroesophageal reflux disease)    OCC   Headache    H/O   IDA (iron deficiency anemia) 10/10/2021   Obesity (BMI 30-39.9)    OSA (obstructive sleep apnea)    USE CPAP    Past Surgical History:  Procedure Laterality Date   BREAST REDUCTION SURGERY  12/18/02   CESAREAN SECTION     X 2   CESAREAN SECTION WITH BILATERAL TUBAL LIGATION N/A 08/04/2015   Procedure: CESAREAN SECTION WITH BILATERAL TUBAL LIGATION;  Surgeon: Gae Dry, MD;  Location: [facility] ORS;  Service: Obstetrics;  Laterality: N/A;   LAPAROSCOPIC CHOLECYSTECTOMY W/ CHOLANGIOGRAPHY  01/22/11   LESION REMOVAL N/A 05/04/2016   Procedure: EXCISION VAGINAL LESION;  Surgeon: Gae Dry, MD;  Location: [facility] ORS;  Service: Gynecology;  Laterality: N/A;   NASAL SINUS SURGERY  05/26/03   SLEEVE GASTROPLASTY     TUBAL LIGATION      Prior to Admission medications   Medication Sig Start Date End Date Taking? Authorizing Provider  folic acid (V-R FOLIC ACID) 924 MCG tablet Take 1 tablet (400 mcg total) by mouth daily. 10/10/21   Earlie Server, MD  Multiple Vitamin (MULTI-VITAMINS) TABS Take 1 tablet by mouth daily.    [provider]  omeprazole (PRILOSEC) 20 MG capsule TAKE ONE CAPSULE BY MOUTH DAILY 01/26/22   Wynetta Emery, Megan P, DO  QUEtiapine (SEROQUEL XR) 50 MG TB24 24 hr tablet Take 1-2 tablets (50-100 mg total) by mouth at bedtime. 10/02/21   Johnson, Megan P, DO  venlafaxine XR (EFFEXOR-XR) 75 MG 24 hr capsule TAKE ONE CAPSULE BY MOUTH DAILY FOR 7 DAYS THEN TAKE TWO  CAPSULES BY MOUTH DAILY 12/28/21   Park Liter P, DO    Allergies as of 02/20/2022 - Review Complete 02/19/2022  Allergen Reaction Noted   Penicillins Hives and Swelling 01/05/2011   Toradol [ketorolac tromethamine] Hives 10/03/2018   Wellbutrin [bupropion] Other (See Comments) 10/22/2016   Sulfa antibiotics Hives and Swelling 01/05/2011    Family History  Problem Relation Age of Onset   Hypertension Mother    Diabetes Mother    Hypertension Brother    Leukemia Maternal Grandmother 57   Diabetes Maternal Grandfather    COPD Maternal Grandfather    Heart disease Maternal Grandfather     Social History   Socioeconomic History   Marital status: Married    Spouse name: Not on file   Number of children: Not on file   Years of education: Not on file   Highest education level: Not on file  Occupational History   Not on file  Tobacco Use   Smoking status: Former    Packs/day: 0.50    Years: 2.00    Total pack years: 1.00    Types: Cigarettes    Quit date: 01/05/2003    Years since quitting: 19.2   Smokeless tobacco: Never  Vaping Use   Vaping Use: Never used  Substance and Sexual Activity   Alcohol use:  Yes    Comment: once a week, wine   Drug use: No   Sexual activity: Yes    Birth control/protection: Surgical  Other Topics Concern   Not on file  Social History Narrative   Married.   2 children last kid had in 2017    Works in Therapist, art.   Enjoys spending time with family.   Social Determinants of Health   Financial Resource Strain: Not on file  Food Insecurity: Not on file  Transportation Needs: Not on file  Physical Activity: Insufficiently Active (05/27/2017)   Exercise Vital Sign    Days of Exercise per Week: 2 days    Minutes of Exercise per Session: 30 min  Stress: Stress Concern Present (05/27/2017)   [place]    Feeling of Stress : Rather much  Social Connections: Moderately  Integrated (05/27/2017)   Social Connection and Isolation Panel [NHANES]    Frequency of Communication with Friends and Family: More than three times a week    Frequency of Social Gatherings with Friends and Family: Once a week    Attends Religious Services: More than 4 times per year    Active Member of [employer] or Organizations: No    Attends Archivist Meetings: Never    Marital Status: Married  Human resources officer Violence: Not At Risk (05/27/2017)   Humiliation, Afraid, Rape, and Kick questionnaire    Fear of Current or Ex-Partner: No    Emotionally Abused: No    Physically Abused: No    Sexually Abused: No    Review of Systems: See HPI, otherwise negative ROS  Physical Exam: There were no vitals taken for this visit. General:   Alert,  pleasant and cooperative in NAD Head:  Normocephalic and atraumatic. Neck:  Supple; no masses or thyromegaly. Lungs:  Clear throughout to auscultation, normal respiratory effort.    Heart:  +S1, +S2, Regular rate and rhythm, No edema. Abdomen:  Soft, nontender and nondistended. Normal bowel sounds, without guarding, and without rebound.   Neurologic:  Alert and  oriented x4;  grossly normal neurologically.  Impression/Plan: Amber [name] is here for an endoscopy and colonoscopy  to be performed for  evaluation of iron deficiency anemia    Risks, benefits, limitations, and alternatives regarding endoscopy have been reviewed with the patient.  Questions have been answered.  All parties agreeable.   Jonathon Bellows, MD  04/02/2022, 8:21 AM

## 2022-04-02 NOTE — Op Note (Signed)
[facility] Gastroenterology Patient Name: Amber [name] Procedure Date: 04/02/2022 8:54 AM MRN: [id number] Account #: [id number] Date of Birth: Jan 09, 1981 Admit Type: Outpatient Age: 41 Room: [facility] ENDO ROOM 1 Gender: Female Note Status: Finalized Instrument Name: Park Meo [id number] Procedure:             Colonoscopy Indications:           Iron deficiency anemia Providers:             Jonathon Bellows MD, MD Medicines:             Monitored Anesthesia Care Complications:         No immediate complications. Procedure:             Pre-Anesthesia Assessment:                        - Prior to the procedure, a History and Physical was                         performed, and patient medications, allergies and                         sensitivities were reviewed. The patient's tolerance                         of previous anesthesia was reviewed.                        - The risks and benefits of the procedure and the                         sedation options and risks were discussed with the                         patient. All questions were answered and informed                         consent was obtained.                        - ASA Grade Assessment: II - A patient with mild                         systemic disease.                        After obtaining informed consent, the colonoscope was                         passed under direct vision. Throughout the procedure,                         the patient's blood pressure, pulse, and oxygen                         saturations were monitored continuously. The                         Colonoscope was introduced through the anus and  advanced to the the cecum, identified by the                         appendiceal orifice. The colonoscopy was performed                         with ease. The patient tolerated the procedure well.                         The quality of the bowel preparation was  excellent. Findings:      The entire examined colon appeared normal on direct and retroflexion       views. Impression:            - The entire examined colon is normal on direct and                         retroflexion views.                        - No specimens collected. Recommendation:        - Discharge patient to home (with escort).                        - Resume previous diet.                        - Continue present medications.                        - Repeat colonoscopy in 10 years for screening                         purposes. Procedure Code(s):     --- Professional ---                        [id number], Colonoscopy, flexible; diagnostic, including                         collection of specimen(s) by brushing or washing, when                         performed (separate procedure) Diagnosis Code(s):     --- Professional ---                        D50.9, Iron deficiency anemia, unspecified CPT copyright 2019 American Medical Association. All rights reserved. The codes documented in this report are preliminary and upon coder review may  be revised to meet current compliance requirements. Jonathon Bellows, MD Jonathon Bellows MD, MD 04/02/2022 9:21:48 AM This report has been signed electronically. Number of Addenda: 0 Note Initiated On: 04/02/2022 8:54 AM Scope Withdrawal Time: 0 hours 5 minutes 24 seconds  Total Procedure Duration: 0 hours 8 minutes 2 seconds  Estimated Blood Loss:  Estimated blood loss: none.      [facility]

## 2022-04-03 ENCOUNTER — Encounter: Payer: Self-pay | Admitting: Gastroenterology

## 2022-04-03 LAB — SURGICAL PATHOLOGY

## 2022-04-04 ENCOUNTER — Encounter: Payer: Self-pay | Admitting: Gastroenterology

## 2022-04-24 ENCOUNTER — Other Ambulatory Visit: Payer: Self-pay | Admitting: Family Medicine

## 2022-04-24 NOTE — Telephone Encounter (Signed)
Requested Prescriptions  Pending Prescriptions Disp Refills  . omeprazole (PRILOSEC) 20 MG capsule [Pharmacy Med Name: OMEPRAZOLE DR 20 MG CAPSULE] 90 capsule 0    Sig: TAKE 1 CAPSULE BY MOUTH DAILY     Gastroenterology: Proton Pump Inhibitors Passed - 04/24/2022  6:21 AM      Passed - Valid encounter within last 12 months    Recent Outpatient Visits          6 months ago Gastroesophageal reflux disease, unspecified whether esophagitis present   [place], Megan P, DO   10 months ago Pneumonia due to COVID-19 virus   [place], [place], DO   1 year ago Anxiety and depression   [place], [place], DO   1 year ago Vertigo   [place], [place], DO   2 years ago Anxiety and depression   [place], Barb [place], DO      Future Appointments            In 4 weeks Jonathon Bellows, MD [place]

## 2022-05-22 ENCOUNTER — Encounter: Payer: Self-pay | Admitting: Gastroenterology

## 2022-05-22 ENCOUNTER — Other Ambulatory Visit: Payer: Self-pay | Admitting: Gastroenterology

## 2022-05-22 ENCOUNTER — Ambulatory Visit (INDEPENDENT_AMBULATORY_CARE_PROVIDER_SITE_OTHER): Payer: 59 | Admitting: Gastroenterology

## 2022-05-22 VITALS — BP 143/78 | HR 86 | Temp 98.3°F | Wt 228.0 lb

## 2022-05-22 DIAGNOSIS — R768 Other specified abnormal immunological findings in serum: Secondary | ICD-10-CM | POA: Diagnosis not present

## 2022-05-22 DIAGNOSIS — D508 Other iron deficiency anemias: Secondary | ICD-10-CM | POA: Diagnosis not present

## 2022-05-22 DIAGNOSIS — K2100 Gastro-esophageal reflux disease with esophagitis, without bleeding: Secondary | ICD-10-CM

## 2022-05-22 DIAGNOSIS — F1010 Alcohol abuse, uncomplicated: Secondary | ICD-10-CM

## 2022-05-22 DIAGNOSIS — R7989 Other specified abnormal findings of blood chemistry: Secondary | ICD-10-CM

## 2022-05-22 DIAGNOSIS — R7689 Other specified abnormal immunological findings in serum: Secondary | ICD-10-CM

## 2022-05-22 NOTE — Progress Notes (Signed)
Amber Mood MD, MRCP(U.K) [address]  [address]  [place], [place] [address]  Main: [phone]  Fax: [phone]   Primary Care Physician: Amber Carrow, DO  Primary Gastroenterologist:  Dr. Wyline [name]   Chief Complaint  Patient presents with   Abnormal LFTs    HPI: Amber [name] is a 41 y.o. female  Summary of history :  Seen previously in 02/11/2022 for iron deficiency anemia and a positive TTG antibody.  History of elevated LFTs attributed to alcohol use.  Follows with Dr. Cathie [name] in hematology.02/14/2022 hemoglobin 13 g ferritin 9 celiac panel demonstrated positive TTG IgA, B12 levels normal, hepatitis B core antibody surface antigen hepatitis C antibody negative.  AST 73 and ALT 67 in April 2023 no recent ultrasound.   Denies any significant upper GI symptoms.  Has had on and off diarrhea no family history of celiac disease very significant history of heavy.  Lasting about 5 days changing a combination of pads and tampons multiple times during the day not seen a GYN recently drinks 3-4 drinks a day for some years.  Trying to cut down.  Denies any NSAID use  Interval history 02/19/2022-05/22/2022  03/14/2022: EGD LA grade a esophagitis seen small hiatal hernia noted, colonoscopy performed on the same day showed no abnormalities.  Biopsies of the duodenum showed no villous atrophy  Ultrasound right upper quadrant showed hepatic steatosis 02/19/2022 mitochondrial antibody positive   Has current drinking alcohol but still continues to drink a few times a week.  She says she is really trying to go off completely.  Is aware of the risks.  Taking her omeprazole for acid reflux. Current Outpatient Medications  Medication Sig Dispense Refill   folic acid (V-R FOLIC ACID) 400 MCG tablet Take 1 tablet (400 mcg total) by mouth daily. 90 tablet 0   Multiple Vitamin (MULTI-VITAMINS) TABS Take 1 tablet by mouth daily.     omeprazole (PRILOSEC) 20 MG capsule TAKE 1 CAPSULE BY MOUTH  DAILY 90 capsule 0   QUEtiapine (SEROQUEL XR) 50 MG TB24 24 hr tablet Take 1-2 tablets (50-100 mg total) by mouth at bedtime. 180 tablet 1   venlafaxine XR (EFFEXOR-XR) 75 MG 24 hr capsule TAKE ONE CAPSULE BY MOUTH DAILY FOR 7 DAYS THEN TAKE TWO CAPSULES BY MOUTH DAILY 60 capsule 2   No current facility-administered medications for this visit.    Allergies as of 05/22/2022 - Review Complete 05/22/2022  Allergen Reaction Noted   Penicillins Hives and Swelling 01/05/2011   Toradol [ketorolac tromethamine] Hives 10/03/2018   Wellbutrin [bupropion] Other (See Comments) 10/22/2016   Sulfa antibiotics Hives and Swelling 01/05/2011    ROS:  General: Negative for anorexia, weight loss, fever, chills, fatigue, weakness. ENT: Negative for hoarseness, difficulty swallowing , nasal congestion. CV: Negative for chest pain, angina, palpitations, dyspnea on exertion, peripheral edema.  Respiratory: Negative for dyspnea at rest, dyspnea on exertion, cough, sputum, wheezing.  GI: See history of present illness. GU:  Negative for dysuria, hematuria, urinary incontinence, urinary frequency, nocturnal urination.  Endo: Negative for unusual weight change.    Physical Examination:   BP (!) 143/78   Pulse 86   Temp 98.3 F (36.8 C) (Oral)   Wt 228 lb (103.4 kg)   BMI 42.38 kg/m   General: Well-nourished, well-developed in no acute distress.  Eyes: No icterus. Conjunctivae pink. Mouth: Oropharyngeal mucosa moist and pink , no lesions erythema or exudate. Neuro: Alert and oriented x 3.  Grossly intact. Skin:  Warm and dry, no jaundice.   Psych: Alert and cooperative, normal [name] and affect.   Imaging Studies: No results found.  Assessment and Plan:   Amber [name] is a 41 y.o. y/o female here to follow-up for abnormal  LFTs, positive tissue transglutaminase antibody which was found on evaluation for microcytic iron deficiency anemia by Dr. Cathie [name] who is on IV iron with normalization of hemoglobin  at this point of time.  History of menorrhagia.  Biopsies of the duodenum were normal indicating she does not have celiac disease.     Plan 1.  Abnormal LFTs may be due to consumption of alcohol or also from celiac disease.  Recheck celiac serology, mitochondrial antibody which was positive previously not immune to hepatitis A she has taken her vaccine and we will check for immunity at a later point was advised to get a vaccine immune to hepatitis B. 2.  Excess alcohol consumption strongly advised her to stop completely explained to her that is the only thing that will make her feel significantly better and she probably has hepatic steatosis from alcohol. 3.  GERD advised to lose weight use a wedge pillow picture of a wedge pillow provided.  Talked about timing of her meals.  At the next visit we will attempt to reduce the dose of PPI if possible 4.  Iron deficiency Mia will recheck CBC  Dr Amber Mood  MD,MRCP [facility]) Follow up in 3-4 months

## 2022-05-22 NOTE — Patient Instructions (Signed)
You can purchase a wedge pillow at [employer].

## 2022-05-30 LAB — CBC WITH DIFFERENTIAL/PLATELET
Basophils Absolute: 0.1 10*3/uL (ref 0.0–0.2)
Basos: 1 %
EOS (ABSOLUTE): 0.1 10*3/uL (ref 0.0–0.4)
Eos: 1 %
Hematocrit: 42.1 % (ref 34.0–46.6)
Hemoglobin: 13.7 g/dL (ref 11.1–15.9)
Immature Grans (Abs): 0 10*3/uL (ref 0.0–0.1)
Immature Granulocytes: 0 %
Lymphocytes Absolute: 2 10*3/uL (ref 0.7–3.1)
Lymphs: 30 %
MCH: 28.1 pg (ref 26.6–33.0)
MCHC: 32.5 g/dL (ref 31.5–35.7)
MCV: 86 fL (ref 79–97)
Monocytes Absolute: 0.5 10*3/uL (ref 0.1–0.9)
Monocytes: 7 %
Neutrophils Absolute: 4.2 10*3/uL (ref 1.4–7.0)
Neutrophils: 61 %
Platelets: 237 10*3/uL (ref 150–450)
RBC: 4.87 x10E6/uL (ref 3.77–5.28)
RDW: 14.2 % (ref 11.7–15.4)
WBC: 6.9 10*3/uL (ref 3.4–10.8)

## 2022-05-30 LAB — COMPREHENSIVE METABOLIC PANEL
ALT: 24 IU/L (ref 0–32)
AST: 24 IU/L (ref 0–40)
Albumin/Globulin Ratio: 1.8 (ref 1.2–2.2)
Albumin: 4.8 g/dL (ref 3.9–4.9)
Alkaline Phosphatase: 62 IU/L (ref 44–121)
BUN/Creatinine Ratio: 10 (ref 9–23)
BUN: 8 mg/dL (ref 6–24)
Bilirubin Total: 0.4 mg/dL (ref 0.0–1.2)
CO2: 24 mmol/L (ref 20–29)
Calcium: 9.4 mg/dL (ref 8.7–10.2)
Chloride: 101 mmol/L (ref 96–106)
Creatinine, Ser: 0.79 mg/dL (ref 0.57–1.00)
Globulin, Total: 2.6 g/dL (ref 1.5–4.5)
Glucose: 104 mg/dL — ABNORMAL HIGH (ref 70–99)
Potassium: 4.2 mmol/L (ref 3.5–5.2)
Sodium: 139 mmol/L (ref 134–144)
Total Protein: 7.4 g/dL (ref 6.0–8.5)
eGFR: 95 mL/min/{1.73_m2} (ref >59)

## 2022-05-30 LAB — ENDOMYSIAL IGA ANTIBODY: Endomysial IgA: NEGATIVE

## 2022-05-30 LAB — CELIAC DISEASE AB SCREEN W/RFX
Antigliadin Abs, IgA: 4 units (ref 0–19)
IgA/Immunoglobulin A, Serum: 241 mg/dL (ref 87–352)
Transglutaminase IgA: 5 U/mL — ABNORMAL HIGH (ref 0–3)

## 2022-05-30 LAB — MITOCHONDRIAL/SMOOTH MUSCLE AB PNL
Mitochondrial Ab: <20 Units (ref 0.0–20.0)
Smooth Muscle Ab: 4 Units (ref 0–19)

## 2022-07-28 ENCOUNTER — Other Ambulatory Visit: Payer: Self-pay | Admitting: Family Medicine

## 2022-07-30 NOTE — Telephone Encounter (Signed)
Requested Prescriptions  Pending Prescriptions Disp Refills   omeprazole (PRILOSEC) 20 MG capsule [Pharmacy Med Name: OMEPRAZOLE DR 20 MG CAPSULE] 90 capsule 0    Sig: TAKE 1 CAPSULE BY MOUTH DAILY     Gastroenterology: Proton Pump Inhibitors Passed - 07/28/2022  6:51 AM      Passed - Valid encounter within last 12 months    Recent Outpatient Visits           10 months ago Gastroesophageal reflux disease, unspecified whether esophagitis present   [place], [place], DO   1 year ago Pneumonia due to COVID-19 virus   [place], DO   1 year ago Anxiety and depression   [place], [place], DO   2 years ago [place], [place], DO   2 years ago Anxiety and depression   [place], [place], DO       Future Appointments             In 1 month Jonathon Bellows, MD [place] [facility] at [facility]

## 2022-08-14 ENCOUNTER — Telehealth: Payer: 59 | Admitting: Physician Assistant

## 2022-08-14 DIAGNOSIS — J069 Acute upper respiratory infection, unspecified: Secondary | ICD-10-CM

## 2022-08-14 MED ORDER — ALBUTEROL SULFATE HFA 108 (90 BASE) MCG/ACT IN AERS: 1.0000 | INHALATION_SPRAY | Freq: Four times a day (QID) | RESPIRATORY_TRACT | 0 refills | Status: DC | PRN

## 2022-08-14 MED ORDER — BENZONATATE 100 MG PO CAPS: 100.0000 mg | ORAL_CAPSULE | Freq: Three times a day (TID) | ORAL | 0 refills | Status: DC | PRN

## 2022-08-14 MED ORDER — FLUTICASONE PROPIONATE 50 MCG/ACT NA SUSP: 2.0000 | Freq: Every day | NASAL | 0 refills | Status: DC

## 2022-08-14 NOTE — Progress Notes (Signed)
E-Visit for Upper Respiratory Infection   We are sorry you are not feeling well.  Here is how we plan to help!  Based on what you have shared with me, it looks like you may have a viral upper respiratory infection.  Upper respiratory infections are caused by a large number of viruses; however, rhinovirus is the most common cause.   Symptoms vary from person to person, with common symptoms including sore throat, cough, fatigue or lack of energy and feeling of general discomfort.  A low-grade fever of up to 100.4 may present, but is often uncommon.  Symptoms vary however, and are closely related to a person's age or underlying illnesses.  The most common symptoms associated with an upper respiratory infection are nasal discharge or congestion, cough, sneezing, headache and pressure in the ears and face.  These symptoms usually persist for about 3 to 10 days, but can last up to 2 weeks.  It is important to know that upper respiratory infections do not cause serious illness or complications in most cases.    Upper respiratory infections can be transmitted from person to person, with the most common method of transmission being a person's hands.  The virus is able to live on the skin and can infect other persons for up to 2 hours after direct contact.  Also, these can be transmitted when someone coughs or sneezes; thus, it is important to cover the mouth to reduce this risk.  To keep the spread of the illness at [place], good hand hygiene is very important.  This is an infection that is most likely caused by a virus. There are no specific treatments other than to help you with the symptoms until the infection runs its course.  We are sorry you are not feeling well.  Here is how we plan to help!   For nasal congestion, you may use an oral decongestants such as Mucinex D or if you have glaucoma or high blood pressure use plain Mucinex.  Saline nasal spray or nasal drops can help and can safely be used as often as  needed for congestion.  For your congestion, I have prescribed Fluticasone nasal spray one spray in each nostril twice a day  If you do not have a history of heart disease, hypertension, diabetes or thyroid disease, prostate/bladder issues or glaucoma, you may also use Sudafed to treat nasal congestion.  It is highly recommended that you consult with a pharmacist or your primary care physician to ensure this medication is safe for you to take.     If you have a cough, you may use cough suppressants such as Delsym and Robitussin.  If you have glaucoma or high blood pressure, you can also use Coricidin HBP.   For cough I have prescribed for you A prescription cough medication called Tessalon Perles 100 mg. You may take 1-2 capsules every 8 hours as needed for cough  Albuterol inhaler 1-2 puffs every 4-6 hours as needed for wheezing and shortness of breath.   If you have a sore or scratchy throat, use a saltwater gargle-  to  teaspoon of salt dissolved in a 4-ounce to 8-ounce glass of warm water.  Gargle the solution for approximately 15-30 seconds and then spit.  It is important not to swallow the solution.  You can also use throat lozenges/cough drops and Chloraseptic spray to help with throat pain or discomfort.  Warm or cold liquids can also be helpful in relieving throat pain.  For headache,  pain or general discomfort, you can use Ibuprofen or Tylenol as directed.   Some authorities believe that zinc sprays or the use of Echinacea may shorten the course of your symptoms.   HOME CARE Only take medications as instructed by your medical team. Be sure to drink plenty of fluids. Water is fine as well as fruit juices, sodas and electrolyte beverages. You may want to stay away from caffeine or alcohol. If you are nauseated, try taking small sips of liquids. How do you know if you are getting enough fluid? Your urine should be a pale yellow or almost colorless. Get rest. Taking a steamy shower or  using a humidifier may help nasal congestion and ease sore throat pain. You can place a towel over your head and breathe in the steam from hot water coming from a faucet. Using a saline nasal spray works much the same way. Cough drops, hard candies and sore throat lozenges may ease your cough. Avoid close contacts especially the very young and the elderly Cover your mouth if you cough or sneeze Always remember to wash your hands.   GET HELP RIGHT AWAY IF: You develop worsening fever. If your symptoms do not improve within 10 days You develop yellow or green discharge from your nose over 3 days. You have coughing fits You develop a severe head ache or visual changes. You develop shortness of breath, difficulty breathing or start having chest pain Your symptoms persist after you have completed your treatment plan  MAKE SURE YOU  Understand these instructions. Will watch your condition. Will get help right away if you are not doing well or get worse.  Thank you for choosing an e-visit.  Your e-visit answers were reviewed by a board certified advanced clinical practitioner to complete your personal care plan. Depending upon the condition, your plan could have included both over the counter or prescription medications.  Please review your pharmacy choice. Make sure the pharmacy is open so you can pick up prescription now. If there is a problem, you may contact your provider through [employer] and have the prescription routed to another pharmacy.  Your safety is important to [place]. If you have drug allergies check your prescription carefully.   For the next 24 hours you can use MyChart to ask questions about today's visit, request a non-urgent call back, or ask for a work or school excuse. You will get an email in the next two days asking about your experience. I hope that your e-visit has been valuable and will speed your recovery.  I have spent 5 minutes in review of e-visit  questionnaire, review and updating patient chart, medical decision making and response to patient.   Mar Daring, PA-C

## 2022-08-16 ENCOUNTER — Telehealth: Payer: 59 | Admitting: Physician Assistant

## 2022-08-16 ENCOUNTER — Inpatient Hospital Stay: Payer: 59

## 2022-08-16 ENCOUNTER — Inpatient Hospital Stay: Payer: 59 | Admitting: Oncology

## 2022-08-16 DIAGNOSIS — J208 Acute bronchitis due to other specified organisms: Secondary | ICD-10-CM | POA: Diagnosis not present

## 2022-08-16 MED ORDER — PREDNISONE 20 MG PO TABS: 40.0000 mg | ORAL_TABLET | Freq: Every day | ORAL | 0 refills | Status: DC

## 2022-08-16 MED ORDER — PROMETHAZINE-DM 6.25-15 MG/5ML PO SYRP: 5.0000 mL | ORAL_SOLUTION | Freq: Four times a day (QID) | ORAL | 0 refills | Status: DC | PRN

## 2022-08-16 NOTE — Progress Notes (Signed)
I have spent 5 minutes in review of e-visit questionnaire, review and updating patient chart, medical decision making and response to patient.   Bayley [place] Empress Newmann, PA-C    

## 2022-08-16 NOTE — Progress Notes (Signed)
We are sorry that you are not feeling well.  Here is how we plan to help!  Based on your presentation I believe you most likely have A cough due to a virus.  This is called viral bronchitis and is best treated by rest, plenty of fluids and control of the cough.  You may use Ibuprofen or Tylenol as directed to help your symptoms.     In addition you may continue to your the benzonatate given at last visit. Along with this I have sent in a prescription cough syrup to use as well. Lastly, I am sending in a short course of prednisone to open airways and calm spasm contributing to the cough.    From your responses in the eVisit questionnaire you describe inflammation in the upper respiratory tract which is causing a significant cough.  This is commonly called Bronchitis and has four common causes:   Allergies Viral Infections Acid Reflux Bacterial Infection Allergies, viruses and acid reflux are treated by controlling symptoms or eliminating the cause. An example might be a cough caused by taking certain blood pressure medications. You stop the cough by changing the medication. Another example might be a cough caused by acid reflux. Controlling the reflux helps control the cough.  USE OF BRONCHODILATOR ("RESCUE") INHALERS: There is a risk from using your bronchodilator too frequently.  The risk is that over-reliance on a medication which only relaxes the muscles surrounding the breathing tubes can reduce the effectiveness of medications prescribed to reduce swelling and congestion of the tubes themselves.  Although you feel brief relief from the bronchodilator inhaler, your asthma may actually be worsening with the tubes becoming more swollen and filled with mucus.  This can delay other crucial treatments, such as oral steroid medications. If you need to use a bronchodilator inhaler daily, several times per day, you should discuss this with your provider.  There are probably better treatments that could  be used to keep your asthma under control.     HOME CARE Only take medications as instructed by your medical team. Complete the entire course of an antibiotic. Drink plenty of fluids and get plenty of rest. Avoid close contacts especially the very young and the elderly Cover your mouth if you cough or cough into your sleeve. Always remember to wash your hands A steam or ultrasonic humidifier can help congestion.   GET HELP RIGHT AWAY IF: You develop worsening fever. You become short of breath You cough up blood. Your symptoms persist after you have completed your treatment plan MAKE SURE YOU  Understand these instructions. Will watch your condition. Will get help right away if you are not doing well or get worse.    Thank you for choosing an e-visit.  Your e-visit answers were reviewed by a board certified advanced clinical practitioner to complete your personal care plan. Depending upon the condition, your plan could have included both over the counter or prescription medications.  Please review your pharmacy choice. Make sure the pharmacy is open so you can pick up prescription now. If there is a problem, you may contact your provider through [employer] and have the prescription routed to another pharmacy.  Your safety is important to [place]. If you have drug allergies check your prescription carefully.   For the next 24 hours you can use MyChart to ask questions about today's visit, request a non-urgent call back, or ask for a work or school excuse. You will get an email in the next two  days asking about your experience. I hope that your e-visit has been valuable and will speed your recovery.

## 2022-08-17 ENCOUNTER — Ambulatory Visit: Payer: 59 | Admitting: Oncology

## 2022-08-17 ENCOUNTER — Other Ambulatory Visit: Payer: 59

## 2022-08-21 ENCOUNTER — Encounter: Payer: Self-pay | Admitting: Oncology

## 2022-09-20 ENCOUNTER — Ambulatory Visit: Payer: 59 | Admitting: Gastroenterology

## 2022-11-18 ENCOUNTER — Ambulatory Visit
Admission: EM | Admit: 2022-11-18 | Discharge: 2022-11-18 | Disposition: A | Discharge status: Home / Self Care | Payer: 59 | Attending: Urgent Care | Admitting: Urgent Care

## 2022-11-18 DIAGNOSIS — J019 Acute sinusitis, unspecified: Secondary | ICD-10-CM

## 2022-11-18 DIAGNOSIS — B9789 Other viral agents as the cause of diseases classified elsewhere: Secondary | ICD-10-CM | POA: Diagnosis not present

## 2022-11-18 MED ORDER — PREDNISONE 50 MG PO TABS: 50.0000 mg | ORAL_TABLET | Freq: Every day | ORAL | 0 refills | Status: AC

## 2022-11-18 NOTE — ED Triage Notes (Signed)
Patient c/o facial pressure that began over a week ago.   Home interventions: tylenol sinus

## 2022-11-18 NOTE — ED Provider Notes (Signed)
[facility]    CSN: [id number] Arrival date & time: 11/18/22  1153      History   Chief Complaint No chief complaint on file.   HPI SHAUNELLE BRYS is a 42 y.o. female.   HPI  Patient presents to urgent care with complaint of sinus pain and pressure accompanied by headache.  Symptoms x 1 week.  She denies cough.  Treated 08/21/2022 (2 months ago) for acute bacterial sinusitis with azithromycin and a course of prednisone.  Past Medical History:  Diagnosis Date   Anemia    Anxiety    Congenital malrotation of intestine    Depression    GERD (gastroesophageal reflux disease)    OCC   Headache    H/O   IDA (iron deficiency anemia) 10/10/2021   Obesity (BMI 30-39.9)    OSA (obstructive sleep apnea)    USE CPAP    Patient Active Problem List   Diagnosis Date Noted   Abnormal celiac antibody panel    Folate deficiency 02/15/2022   Alcohol use 10/10/2021   Transaminitis 10/10/2021   IDA (iron deficiency anemia) 10/10/2021   S/P gastric sleeve procedure 10/02/2021   Gastroesophageal reflux disease 12/02/2018   OSA (obstructive sleep apnea) 12/17/2017   Lateral epicondylitis of right elbow 08/21/2017   Anxiety and depression 04/02/2016   Morbid (severe) obesity due to excess calories (HCC) 11/29/2015    Past Surgical History:  Procedure Laterality Date   BREAST REDUCTION SURGERY  12/18/02   CESAREAN SECTION     X 2   CESAREAN SECTION WITH BILATERAL TUBAL LIGATION N/A 08/04/2015   Procedure: CESAREAN SECTION WITH BILATERAL TUBAL LIGATION;  Surgeon: Nadara Mustard, MD;  Location: [facility] ORS;  Service: Obstetrics;  Laterality: N/A;   COLONOSCOPY WITH PROPOFOL N/A 04/02/2022   Procedure: COLONOSCOPY WITH PROPOFOL;  Surgeon: Wyline Mood, MD;  Location: [facility] ENDOSCOPY;  Service: Gastroenterology;  Laterality: N/A;   ESOPHAGOGASTRODUODENOSCOPY N/A 04/02/2022   Procedure: ESOPHAGOGASTRODUODENOSCOPY (EGD);  Surgeon: Wyline Mood, MD;  Location: [facility] ENDOSCOPY;  Service:  Gastroenterology;  Laterality: N/A;   LAPAROSCOPIC CHOLECYSTECTOMY W/ CHOLANGIOGRAPHY  01/22/11   LESION REMOVAL N/A 05/04/2016   Procedure: EXCISION VAGINAL LESION;  Surgeon: Nadara Mustard, MD;  Location: [facility] ORS;  Service: Gynecology;  Laterality: N/A;   NASAL SINUS SURGERY  05/26/03   SLEEVE GASTROPLASTY     TUBAL LIGATION      OB History     Gravida  2   Para  2   Term  2   Preterm      AB      Living  2      SAB      IAB      Ectopic      Multiple  0   Live Births  2            Home Medications    Prior to Admission medications   Medication Sig Start Date End Date Taking? Authorizing Provider  albuterol (VENTOLIN HFA) 108 (90 Base) MCG/ACT inhaler Inhale 1-2 puffs into the lungs every 6 (six) hours as needed. 08/14/22   Margaretann Loveless, PA-C  benzonatate (TESSALON) 100 MG capsule Take 1 capsule (100 mg total) by mouth 3 (three) times daily as needed. 08/14/22   Margaretann Loveless, PA-C  fluticasone (FLONASE) 50 MCG/ACT nasal spray Place 2 sprays into both nostrils daily. 08/14/22   Margaretann Loveless, PA-C  folic acid (V-R FOLIC ACID) 400 MCG tablet Take 1 tablet (400  mcg total) by mouth daily. 10/10/21   Rickard Patience, MD  Multiple Vitamin (MULTI-VITAMINS) TABS Take 1 tablet by mouth daily.    [provider]  omeprazole (PRILOSEC) 20 MG capsule TAKE 1 CAPSULE BY MOUTH DAILY 07/30/22   Laural Benes, Megan P, DO  predniSONE (DELTASONE) 20 MG tablet Take 2 tablets (40 mg total) by mouth daily with breakfast. 08/16/22   Waldon Merl, PA-C  promethazine-dextromethorphan (PROMETHAZINE-DM) 6.25-15 MG/5ML syrup Take 5 mLs by mouth 4 (four) times daily as needed for cough. 08/16/22   Waldon Merl, PA-C  QUEtiapine (SEROQUEL XR) 50 MG TB24 24 hr tablet Take 1-2 tablets (50-100 mg total) by mouth at bedtime. 10/02/21   Johnson, Megan P, DO  venlafaxine XR (EFFEXOR-XR) 75 MG 24 hr capsule TAKE ONE CAPSULE BY MOUTH DAILY FOR 7 DAYS THEN TAKE TWO CAPSULES BY  MOUTH DAILY 12/28/21   Dorcas Carrow, DO    Family History Family History  Problem Relation Age of Onset   Hypertension Mother    Diabetes Mother    Hypertension Brother    Leukemia Maternal Grandmother 40   Diabetes Maternal Grandfather    COPD Maternal Grandfather    Heart disease Maternal Grandfather     Social History Social History   Tobacco Use   Smoking status: Former    Packs/day: 0.50    Years: 2.00    Additional pack years: 0.00    Total pack years: 1.00    Types: Cigarettes    Quit date: 01/05/2003    Years since quitting: 19.8   Smokeless tobacco: Never  Vaping Use   Vaping Use: Never used  Substance Use Topics   Alcohol use: Not Currently    Alcohol/week: 4.0 standard drinks of alcohol    Types: 2 Glasses of wine, 2 Standard drinks or equivalent per week   Drug use: No     Allergies   Penicillins, Toradol [ketorolac tromethamine], Wellbutrin [bupropion], and Sulfa antibiotics   Review of Systems Review of Systems   Physical Exam Triage Vital Signs ED Triage Vitals  Enc Vitals Group     BP 11/18/22 1309 116/76     Pulse Rate 11/18/22 1309 80     Resp 11/18/22 1309 18     Temp 11/18/22 1309 98.7 F (37.1 C)     Temp Source 11/18/22 1309 Oral     SpO2 11/18/22 1309 97 %     Weight --      Height --      Head Circumference --      Peak Flow --      Pain Score 11/18/22 1306 5     Pain Loc --      Pain Edu? --      Excl. in GC? --    No data found.  Updated Vital Signs BP 116/76 (BP Location: Right Arm)   Pulse 80   Temp 98.7 F (37.1 C) (Oral)   Resp 18   LMP 10/31/2022   SpO2 97%   Visual Acuity Right Eye Distance:   Left Eye Distance:   Bilateral Distance:    Right Eye Near:   Left Eye Near:    Bilateral Near:     Physical Exam Vitals reviewed.  Constitutional:      Appearance: Normal appearance. She is ill-appearing.  HENT:     Nose:     Right Sinus: Maxillary sinus tenderness present.     Left Sinus: Maxillary  sinus tenderness present.  Cardiovascular:  Rate and Rhythm: Normal rate and regular rhythm.     Pulses: Normal pulses.     Heart sounds: Normal heart sounds.  Pulmonary:     Effort: Pulmonary effort is normal.     Breath sounds: Normal breath sounds.  Skin:    General: Skin is warm and dry.  Neurological:     General: No focal deficit present.     Mental Status: She is alert and oriented to person, place, and time.  Psychiatric:        Mood and Affect: Mood normal.        Behavior: Behavior normal.      UC Treatments / Results  Labs (all labs ordered are listed, but only abnormal results are displayed) Labs Reviewed - No data to display  EKG   Radiology No results found.  Procedures Procedures (including critical care time)  Medications Ordered in UC Medications - No data to display  Initial Impression / Assessment and Plan / UC Course  I have reviewed the triage vital signs and the nursing notes.  Pertinent labs & imaging results that were available during my care of the patient were reviewed by me and considered in my medical decision making (see chart for details).   JERIN ROWEN is a 42 y.o. female presenting with acute sinusitis. Patient is afebrile without recent antipyretics, satting well on room air. Overall is ill appearing though non-toxic, well hydrated, without respiratory distress. Pulmonary exam is unremarkable.  Lungs CTAB without wheezing, rhonchi, rales. RRR.  Bilateral maxillary sinus tenderness with palpation.  Reviewed relevant chart history.   Patient's symptoms are consistent with an acute viral or post viral process resulting in sinus inflammation.  Had discussion with patient regarding the biotic stewardship, antibiotics are not effective against viral infections.  Given her severe symptoms by her own report, will prescribe a course of prednisone to speed resolution of her sinus inflammation and hopefully prevent secondary bacterial infection  from developing.  Counseled patient on potential for adverse effects with medications prescribed/recommended today, ER and return-to-clinic precautions discussed, patient verbalized understanding and agreement with care plan.  Final Clinical Impressions(s) / UC Diagnoses   Final diagnoses:  None   Discharge Instructions   None    ED Prescriptions   None    PDMP not reviewed this encounter.   Charma Igo, [place] 11/18/22 1333

## 2022-11-18 NOTE — Discharge Instructions (Addendum)
Recommend daily use of acid reducing medication while you are taking prednisone.  Follow up here or with your primary care provider if your symptoms are worsening or not improving.

## 2023-08-23 ENCOUNTER — Encounter: Payer: Self-pay | Admitting: Emergency Medicine

## 2023-08-23 ENCOUNTER — Emergency Department: Payer: 59

## 2023-08-23 ENCOUNTER — Emergency Department
Admission: EM | Admit: 2023-08-23 | Discharge: 2023-08-23 | Disposition: A | Discharge status: Home / Self Care | Payer: 59 | Attending: Emergency Medicine | Admitting: Emergency Medicine

## 2023-08-23 ENCOUNTER — Other Ambulatory Visit: Payer: Self-pay

## 2023-08-23 DIAGNOSIS — K529 Noninfective gastroenteritis and colitis, unspecified: Secondary | ICD-10-CM | POA: Diagnosis not present

## 2023-08-23 DIAGNOSIS — D509 Iron deficiency anemia, unspecified: Secondary | ICD-10-CM | POA: Insufficient documentation

## 2023-08-23 DIAGNOSIS — R1031 Right lower quadrant pain: Secondary | ICD-10-CM | POA: Diagnosis present

## 2023-08-23 LAB — CBC
HCT: 33 % — ABNORMAL LOW (ref 36.0–46.0)
Hemoglobin: 9.1 g/dL — ABNORMAL LOW (ref 12.0–15.0)
MCH: 19.9 pg — ABNORMAL LOW (ref 26.0–34.0)
MCHC: 27.6 g/dL — ABNORMAL LOW (ref 30.0–36.0)
MCV: 72.2 fL — ABNORMAL LOW (ref 80.0–100.0)
Platelets: 208 10*3/uL (ref 150–400)
RBC: 4.57 MIL/uL (ref 3.87–5.11)
RDW: 20.6 % — ABNORMAL HIGH (ref 11.5–15.5)
WBC: 3.7 10*3/uL — ABNORMAL LOW (ref 4.0–10.5)
nRBC: 0 % (ref 0.0–0.2)

## 2023-08-23 LAB — COMPREHENSIVE METABOLIC PANEL
ALT: 52 U/L — ABNORMAL HIGH (ref 0–44)
AST: 51 U/L — ABNORMAL HIGH (ref 15–41)
Albumin: 3.9 g/dL (ref 3.5–5.0)
Alkaline Phosphatase: 40 U/L (ref 38–126)
Anion gap: 11 (ref 5–15)
BUN: 17 mg/dL (ref 6–20)
CO2: 21 mmol/L — ABNORMAL LOW (ref 22–32)
Calcium: 8.4 mg/dL — ABNORMAL LOW (ref 8.9–10.3)
Chloride: 106 mmol/L (ref 98–111)
Creatinine, Ser: 0.86 mg/dL (ref 0.44–1.00)
GFR, Estimated: >60 mL/min (ref >60)
Glucose, Bld: 110 mg/dL — ABNORMAL HIGH (ref 70–99)
Potassium: 3.1 mmol/L — ABNORMAL LOW (ref 3.5–5.1)
Sodium: 138 mmol/L (ref 135–145)
Total Bilirubin: 1.3 mg/dL — ABNORMAL HIGH (ref 0.0–1.2)
Total Protein: 7.2 g/dL (ref 6.5–8.1)

## 2023-08-23 LAB — HCG, QUANTITATIVE, PREGNANCY: hCG, Beta Chain, Quant, S: <1 m[IU]/mL (ref <5)

## 2023-08-23 LAB — MAGNESIUM: Magnesium: 1.7 mg/dL (ref 1.7–2.4)

## 2023-08-23 LAB — RESP PANEL BY RT-PCR (RSV, FLU A&B, COVID)  RVPGX2
Influenza A by PCR: NEGATIVE
Influenza B by PCR: NEGATIVE
Resp Syncytial Virus by PCR: NEGATIVE
SARS Coronavirus 2 by RT PCR: NEGATIVE

## 2023-08-23 LAB — LIPASE, BLOOD: Lipase: 29 U/L (ref 11–51)

## 2023-08-23 MED ORDER — POTASSIUM CHLORIDE CRYS ER 20 MEQ PO TBCR
40.0000 meq | EXTENDED_RELEASE_TABLET | Freq: Once | ORAL | Status: AC
  Administered 2023-08-23: 40 meq via ORAL
  Filled 2023-08-23: qty 2

## 2023-08-23 MED ORDER — MAGNESIUM OXIDE -MG SUPPLEMENT 400 (240 MG) MG PO TABS
400.0000 mg | ORAL_TABLET | Freq: Once | ORAL | Status: AC
Start: 2023-08-23 — End: 2023-08-23
  Administered 2023-08-23: 400 mg via ORAL
  Filled 2023-08-23: qty 1

## 2023-08-23 MED ORDER — FERROUS SULFATE 325 (65 FE) MG PO TBEC: 325.0000 mg | DELAYED_RELEASE_TABLET | Freq: Two times a day (BID) | ORAL | 2 refills | Status: DC

## 2023-08-23 MED ORDER — ONDANSETRON 4 MG PO TBDP: 4.0000 mg | ORAL_TABLET | Freq: Three times a day (TID) | ORAL | 0 refills | Status: DC | PRN

## 2023-08-23 MED ORDER — PROCHLORPERAZINE EDISYLATE 10 MG/2ML IJ SOLN
10.0000 mg | Freq: Once | INTRAMUSCULAR | Status: AC
  Administered 2023-08-23: 10 mg via INTRAVENOUS
  Filled 2023-08-23: qty 2

## 2023-08-23 MED ORDER — LACTATED RINGERS IV BOLUS
1000.0000 mL | Freq: Once | INTRAVENOUS | Status: AC
  Administered 2023-08-23: 1000 mL via INTRAVENOUS

## 2023-08-23 MED ORDER — DIPHENHYDRAMINE HCL 50 MG/ML IJ SOLN
25.0000 mg | Freq: Once | INTRAMUSCULAR | Status: AC
  Administered 2023-08-23: 25 mg via INTRAVENOUS
  Filled 2023-08-23: qty 1

## 2023-08-23 MED ORDER — IOHEXOL 300 MG/ML  SOLN
100.0000 mL | Freq: Once | INTRAMUSCULAR | Status: AC | PRN
  Administered 2023-08-23: 100 mL via INTRAVENOUS

## 2023-08-23 NOTE — ED Triage Notes (Signed)
 Pt here with abd pain and diarrhea x2 days. Pt sent here from [facility]. Pt states her entire lower abd is hurting and she is feeling fatigued. Pt has a little nauseous as well.

## 2023-08-23 NOTE — ED Provider Notes (Signed)
 [facility] Provider Note    Event Date/Time   First MD Initiated Contact with Patient 08/23/23 1025     (approximate)   History   Chief Complaint Abdominal Pain and Diarrhea   HPI  JEVAEH SHAMS is a 43 y.o. female with past medical history of alcohol abuse, iron deficiency anemia, and gastric sleeve procedure who presents to the ED complaining of abdominal pain.  Patient reports that she initially developed fever, nausea, and vomiting 2 days ago, tested negative for COVID and flu at that time.  Fever and vomiting have resolved, however she has had significant diarrhea over the past 2 days along with increasing pain in her abdomen, which has become severe.  She describes sharp pain in the bilateral lower quadrants of her abdomen, has not had any blood in her stool and denies any difficulty urinating.  Her husband was sick with GI symptoms last week, however these resolved after about 24 hours.  She endorses gradually worsening bilateral frontal headache over the past 2 days as well.     Physical Exam   Triage Vital Signs: ED Triage Vitals  Encounter Vitals Group     BP 08/23/23 0955 104/68     Systolic BP Percentile --      Diastolic BP Percentile --      Pulse Rate 08/23/23 0953 92     Resp 08/23/23 0953 17     Temp 08/23/23 0953 98.7 F (37.1 C)     Temp Source 08/23/23 0953 Oral     SpO2 08/23/23 0953 99 %     Weight 08/23/23 0953 227 lb 15.3 oz (103.4 kg)     Height 08/23/23 0953 5' 1.5" (1.562 m)     Head Circumference --      Peak Flow --      Pain Score 08/23/23 0953 7     Pain Loc --      Pain Education --      Exclude from Growth Chart --     Most recent vital signs: Vitals:   08/23/23 0953 08/23/23 0955  BP:  104/68  Pulse: 92   Resp: 17   Temp: 98.7 F (37.1 C)   SpO2: 99%     Constitutional: Alert and oriented. Eyes: Conjunctivae are normal. Head: Atraumatic. Neck: Supple with no meningismus. Nose: No  congestion/rhinnorhea. Mouth/Throat: Mucous membranes are moist.  Cardiovascular: Normal rate, regular rhythm. Grossly normal heart sounds.  2+ radial pulses bilaterally. Respiratory: Normal respiratory effort.  No retractions. Lungs CTAB. Gastrointestinal: Soft and tender to palpation in the bilateral lower quadrants with no rebound or guarding. No distention. Musculoskeletal: No lower extremity tenderness nor edema.  Neurologic:  Normal speech and language. No gross focal neurologic deficits are appreciated.    ED Results / Procedures / Treatments   Labs (all labs ordered are listed, but only abnormal results are displayed) Labs Reviewed  COMPREHENSIVE METABOLIC PANEL - Abnormal; Notable for the following components:      Result Value   Potassium 3.1 (*)    CO2 21 (*)    Glucose, Bld 110 (*)    Calcium 8.4 (*)    AST 51 (*)    ALT 52 (*)    Total Bilirubin 1.3 (*)    All other components within normal limits  CBC - Abnormal; Notable for the following components:   WBC 3.7 (*)    Hemoglobin 9.1 (*)    HCT 33.0 (*)    MCV 72.2 (*)  MCH 19.9 (*)    MCHC 27.6 (*)    RDW 20.6 (*)    All other components within normal limits  RESP PANEL BY RT-PCR (RSV, FLU A&B, COVID)  RVPGX2  LIPASE, BLOOD  MAGNESIUM  HCG, QUANTITATIVE, PREGNANCY  URINALYSIS, ROUTINE W REFLEX MICROSCOPIC  POC URINE PREG, ED    RADIOLOGY CT imaging reviewed and interpreted by me with no inflammatory changes, focal fluid collections, or dilated bowel loops.  PROCEDURES:  Critical Care performed: No  Procedures   MEDICATIONS ORDERED IN ED: Medications  lactated ringers bolus 1,000 mL (1,000 mLs Intravenous New Bag/Given 08/23/23 1116)  prochlorperazine (COMPAZINE) injection 10 mg (10 mg Intravenous Given 08/23/23 1117)  diphenhydrAMINE (BENADRYL) injection 25 mg (25 mg Intravenous Given 08/23/23 1117)  iohexol (OMNIPAQUE) 300 MG/ML solution 100 mL (100 mLs Intravenous Contrast Given 08/23/23 1132)   potassium chloride SA (KLOR-CON M) CR tablet 40 mEq (40 mEq Oral Given 08/23/23 1410)  magnesium oxide (MAG-OX) tablet 400 mg (400 mg Oral Given 08/23/23 1411)     IMPRESSION / MDM / ASSESSMENT AND PLAN / ED COURSE  I reviewed the triage vital signs and the nursing notes.                              43 y.o. female with past medical history of alcohol abuse, iron deficiency anemia, and gastric sleeve procedure who presents to the ED complaining of 3 days of increasing abdominal pain with fever and nausea that have resolved, continues to have diarrhea.  Patient's presentation is most consistent with acute presentation with potential threat to life or bodily function.  Differential diagnosis includes, but is not limited to, gastroenteritis, dehydration, electrolyte abnormality, AKI, kidney stone, appendicitis, diverticulitis, COVID-19, influenza.  Patient nontoxic-appearing and in no acute distress, vital signs are unremarkable.  Her abdomen is soft but she does have tenderness to palpation in the bilateral lower quadrants, suspect gastroenteritis but with significant tenderness we will check CT of abdomen/pelvis.  Labs show a microcytic anemia, patient denies any recent bleeding and this is likely due to known iron deficiency, has had similar anemia in the past.  She has mild leukopenia and hypokalemia but no significant AKI, will add on magnesium level.  Pregnancy testing and urinalysis are pending at this time.  Headache has been gradually worsening and no findings concerning for [facility] or meningitis, will treat symptomatically with IV Compazine and Benadryl, hydrate with IV fluids.  CT imaging is negative for acute process, patient symptoms improved on reassessment and she is tolerating oral intake.  Suspect gastroenteritis along with iron deficiency anemia and she is appropriate for discharge home with outpatient follow-up.  We will restart her on supplemental iron, also prescribe Zofran.  She was  counseled to use loperamide as needed for diarrhea and to follow-up with her PCP, otherwise return to the ED for new or worsening symptoms.  Patient agrees with plan.   FINAL CLINICAL IMPRESSION(S) / ED DIAGNOSES   Final diagnoses:  Gastroenteritis  Iron deficiency anemia, unspecified iron deficiency anemia type     Rx / DC Orders   ED Discharge Orders          Ordered    ondansetron (ZOFRAN-ODT) 4 MG disintegrating tablet  Every 8 hours PRN        08/23/23 1428    ferrous sulfate 325 (65 FE) MG EC tablet  2 times daily        08/23/23  1428             Note:  This document was prepared using Dragon voice recognition software and may include unintentional dictation errors.   Chesley Noon, MD 08/23/23 1430

## 2023-12-26 ENCOUNTER — Telehealth: Admitting: Physician Assistant

## 2023-12-26 ENCOUNTER — Encounter

## 2023-12-26 DIAGNOSIS — L0390 Cellulitis, unspecified: Secondary | ICD-10-CM | POA: Diagnosis not present

## 2023-12-26 DIAGNOSIS — W57XXXA Bitten or stung by nonvenomous insect and other nonvenomous arthropods, initial encounter: Secondary | ICD-10-CM | POA: Diagnosis not present

## 2023-12-26 DIAGNOSIS — S80861A Insect bite (nonvenomous), right lower leg, initial encounter: Secondary | ICD-10-CM | POA: Diagnosis not present

## 2023-12-26 MED ORDER — DOXYCYCLINE HYCLATE 100 MG PO TABS: 100.0000 mg | ORAL_TABLET | Freq: Two times a day (BID) | ORAL | 0 refills | Status: DC

## 2023-12-26 NOTE — Patient Instructions (Signed)
 Amber [name], thank you for joining Amber CHRISTELLA Dickinson, PA-C for today's virtual visit.  While this provider is not your primary care provider (PCP), if your PCP is located in our provider database this encounter information will be shared with them immediately following your visit.   A [place] MyChart account gives you access to today's visit and all your visits, tests, and labs performed at [facility]  click here if you don't have a [facility] MyChart account or go to [email or web address]  Consent: (Patient) Amber [name] provided verbal consent for this virtual visit at the beginning of the encounter.  Current Medications:  Current Outpatient Medications:    doxycycline  (VIBRA -TABS) 100 MG tablet, Take 1 tablet (100 mg total) by mouth 2 (two) times daily., Disp: 14 tablet, Rfl: 0   albuterol  (VENTOLIN  HFA) 108 (90 Base) MCG/ACT inhaler, Inhale 1-2 puffs into the lungs every 6 (six) hours as needed., Disp: 8 g, Rfl: 0   benzonatate  (TESSALON ) 100 MG capsule, Take 1 capsule (100 mg total) by mouth 3 (three) times daily as needed., Disp: 30 capsule, Rfl: 0   ferrous sulfate  325 (65 FE) MG EC tablet, Take 1 tablet (325 mg total) by mouth 2 (two) times daily., Disp: 60 tablet, Rfl: 2   fluticasone  (FLONASE ) 50 MCG/ACT nasal spray, Place 2 sprays into both nostrils daily., Disp: 16 g, Rfl: 0   folic acid  (V-R FOLIC ACID ) 400 MCG tablet, Take 1 tablet (400 mcg total) by mouth daily., Disp: 90 tablet, Rfl: 0   Multiple Vitamin (MULTI-VITAMINS) TABS, Take 1 tablet by mouth daily., Disp: , Rfl:    omeprazole  (PRILOSEC) 20 MG capsule, TAKE 1 CAPSULE BY MOUTH DAILY, Disp: 90 capsule, Rfl: 0   ondansetron  (ZOFRAN -ODT) 4 MG disintegrating tablet, Take 1 tablet (4 mg total) by mouth every 8 (eight) hours as needed for nausea or vomiting., Disp: 12 tablet, Rfl: 0   promethazine -dextromethorphan (PROMETHAZINE -DM) 6.25-15 MG/5ML syrup, Take 5 mLs by mouth 4 (four) times daily as  needed for cough., Disp: 118 mL, Rfl: 0   QUEtiapine  (SEROQUEL  XR) 50 MG TB24 24 hr tablet, Take 1-2 tablets (50-100 mg total) by mouth at bedtime., Disp: 180 tablet, Rfl: 1   venlafaxine  XR (EFFEXOR -XR) 75 MG 24 hr capsule, TAKE ONE CAPSULE BY MOUTH DAILY FOR 7 DAYS THEN TAKE TWO CAPSULES BY MOUTH DAILY, Disp: 60 capsule, Rfl: 2   Medications ordered in this encounter:  Meds ordered this encounter  Medications   doxycycline  (VIBRA -TABS) 100 MG tablet    Sig: Take 1 tablet (100 mg total) by mouth 2 (two) times daily.    Dispense:  14 tablet    Refill:  0    Supervising Provider:   BLAISE ALEENE KIDD [[id number]]     *If you need refills on other medications prior to your next appointment, please contact your pharmacy*  Follow-Up: Call back or seek an in-person evaluation if the symptoms worsen or if the condition fails to improve as anticipated.  [facility] [phone]  Other Instructions Tick Bite Information, Adult  Ticks are insects that draw blood for food. They climb onto people and animals that brush against the leaves and grasses that they live in. They then bite and attach to the skin. Most ticks are harmless, but some ticks may carry germs that can cause disease. These germs are spread to a person through a bite. To lower your risk of getting a disease from a tick bite, make sure you:  Take steps to prevent tick bites. Check for ticks after being outdoors where ticks live. Watch for symptoms of disease if a tick attached to you or if you think a tick bit you. How can I prevent tick bites? Take these steps to help prevent tick bites when you go outdoors in an area where ticks live: Before you go outdoors: Wear long sleeves and long pants to protect your skin from ticks. Wear light-colored clothing so you can see ticks easier. Tuck your pant legs into your socks. Apply insect repellent that has DEET (20% or higher), picaridin, or IR3535 in it to the following  areas: Any bare skin. Avoid areas around the eyes and mouth. Edges of clothing, like the top of your boots, the bottom of your pant legs, and your sleeve cuffs. Consider applying an insect repellant that contains permethrin. Follow the instructions on the label. Do not apply permethrin directly to the skin. Instead, apply to the following areas: Clothing and shoes. Outdoor gear and tents. When you are outdoors: Avoid walking through areas with long grass. If you are walking on a trail, stay in the middle of the trail so your skin, hair, and clothing do not touch the bushes. Check for ticks on your clothing, hair, and skin often while you are outdoors. Check again before you go inside. When you go indoors: Check your clothing for ticks. Tumble dry clothes in a dryer on high heat for at least 10 minutes. If clothes are damp, additional time may be needed. If clothes require washing, use hot water . Check your gear and pets. Shower soon after being outdoors. Check your body for ticks. Do a full body check using a mirror. Be sure to check your scalp, neck, armpits, waist, groin, and joint areas. These are the spots where ticks attach themselves most often. What is the best way to remove a tick?  Remove the tick as soon as possible. Removing it can prevent germs from passing to your body. Do not remove the tick with your bare fingers. Do not try to remove a tick with heat, alcohol, petroleum jelly, or fingernail polish. These things can cause the tick to salivate and regurgitate into your bloodstream, increasing your risk of getting a disease. To remove a tick that is crawling on your skin: Go outside and brush the tick off. Use tape or a lint roller. To remove a tick that is attached to your skin: Wash your hands. If you have gloves, put them on. Use a fine-tipped tweezer, curved forceps, or a tick-removal tool to gently grasp the tick as close to your skin and the tick's head as  possible. Gently pull with a steady, upward, and even pressure until the tick lets go. While removing the tick: Take care to keep the tick's head attached to its body. Do not twist or jerk the tick. This can make the tick's head or mouth parts break off and stay in your skin. If this happens, try to remove the mouth parts with tweezers. If you cannot remove them, leave the area alone and let the skin heal. Do not squeeze or crush the tick's body. This could force disease-carrying fluids from the tick into your body. What should I do after removing a tick? Clean the bite area and your hands with soap and water , rubbing alcohol, or an iodine scrub. If an antiseptic cream or ointment is available, put a small amount on the bite area. Wash and disinfect any tools that you used  to remove the tick. How should I dispose of a tick? To dispose of a live tick, use one of these methods: Place it in rubbing alcohol. Place it in a sealed bag or container, and throw it away. Wrap it tightly in tape, and throw it away. Flush it down the toilet. Where to find more information Centers for Disease Control and Prevention: GyrateAtrophy.si U.S. Environmental Protection Agency: RelocationNetworking.fi Contact a health care provider if: You have symptoms of a disease after a tick bite. Symptoms of a tick-borne disease can occur from moments after the tick bites to 30 days after a tick is removed. Symptoms include: Fever or chills. A red rash that makes a circle (bull's-eye rash) in the bite area. Redness and swelling in the bite area. Headache or stiff neck. Muscle, joint, or bone pain. Abnormal tiredness. Numbness in your legs or trouble walking or moving your legs. Tender or swollen lymph glands. Abdominal pain, vomiting, diarrhea, or weight loss. Get help right away if: You are not able to remove a tick. You have muscle weakness or paralysis. Your symptoms get worse or you experience new symptoms. You  find an engorged tick on your skin and you are in an area where there is a higher risk of disease from ticks. Summary Ticks may carry germs that can spread to a person through a bite. These germs can cause disease. Wear protective clothing and use insect repellent to prevent tick bites. Follow the instructions on the label. If you find a tick on your body, remove it as soon as possible. If the tick is attached, do not try to remove it with heat, alcohol, petroleum jelly, or fingernail polish. If you have symptoms of a disease after being bitten by a tick, contact a health care provider. This information is not intended to replace advice given to you by your health care provider. Make sure you discuss any questions you have with your health care provider. Document Revised: 09/11/2021 Document Reviewed: 09/11/2021 Elsevier Patient Education  2024 Elsevier Inc.   If you have been instructed to have an in-person evaluation today at a local Urgent Care facility, please use the link below. It will take you to a list of all of our available [facility], including address, phone number and hours of operation. Please do not delay care.  [facility]  If you or a family member do not have a primary care provider, use the link below to schedule a visit and establish care. When you choose a [facility] primary care physician or advanced practice provider, you gain a long-term partner in health. Find a Primary Care Provider  Learn more about [facility]'s in-office and virtual care options: [facility] - Get Care Now

## 2023-12-26 NOTE — Progress Notes (Signed)
 Virtual Visit Consent   Amber [name], you are scheduled for a virtual visit with a [facility] provider today. Just as with appointments in the office, your consent must be obtained to participate. Your consent will be active for this visit and any virtual visit you may have with one of our providers in the next 365 days. If you have a MyChart account, a copy of this consent can be sent to you electronically.  As this is a virtual visit, video technology does not allow for your provider to perform a traditional examination. This may limit your provider's ability to fully assess your condition. If your provider identifies any concerns that need to be evaluated in person or the need to arrange testing (such as labs, EKG, etc.), we will make arrangements to do so. Although advances in technology are sophisticated, we cannot ensure that it will always work on either your end or our end. If the connection with a video visit is poor, the visit may have to be switched to a telephone visit. With either a video or telephone visit, we are not always able to ensure that we have a secure connection.  By engaging in this virtual visit, you consent to the provision of healthcare and authorize for your insurance to be billed (if applicable) for the services provided during this visit. Depending on your insurance coverage, you may receive a charge related to this service.  I need to obtain your verbal consent now. Are you willing to proceed with your visit today? Amber [name] has provided verbal consent on 12/26/2023 for a virtual visit (video or telephone). Delon CHRISTELLA Dickinson, PA-C  Date: 12/26/2023 5:36 PM   Virtual Visit via Video Note   I, Delon CHRISTELLA Dickinson, connected with  Amber [name]  ([id number], [birth year]) on 12/26/23 at  5:30 PM EDT by a video-enabled telemedicine application and verified that I am speaking with the correct person using two identifiers.  Location: Patient: Virtual Visit Location  Patient: Mobile Provider: Virtual Visit Location Provider: Home Office   I discussed the limitations of evaluation and management by telemedicine and the availability of in person appointments. The patient expressed understanding and agreed to proceed.    History of Present Illness: Amber [name] is a 43 y.o. who identifies as a female who was assigned female at birth, and is being seen today for tick bite. Was bitten on June 8th. Has had a headache for a few days, but did also break a tooth this week. Is having some mild fatigue. Denies fevers, chills, myalgias, arthralgias, or rashes. Tick bite is on the right medial lower leg about 6 cm above the medial malleolus. Reports it is tender to touch.   Problems:  Patient Active Problem List   Diagnosis Date Noted   Abnormal celiac antibody panel    Folate deficiency 02/15/2022   Alcohol use 10/10/2021   Transaminitis 10/10/2021   IDA (iron  deficiency anemia) 10/10/2021   S/P gastric sleeve procedure 10/02/2021   Gastroesophageal reflux disease 12/02/2018   OSA (obstructive sleep apnea) 12/17/2017   Lateral epicondylitis of right elbow 08/21/2017   Anxiety and depression 04/02/2016   Morbid (severe) obesity due to excess calories (HCC) 11/29/2015    Allergies:  Allergies  Allergen Reactions   Penicillins Hives and Swelling    Has patient had a PCN reaction causing immediate rash, facial/tongue/throat swelling, SOB or lightheadedness with hypotension:No  Has patient had a PCN reaction causing severe rash involving mucus membranes or  skin necrosis:Yes Has patient had a PCN reaction that required hospitalization:No Has patient had a PCN reaction occurring within the last 10 years:No If all of the above answers are NO, then may proceed with Cephalosporin use.    Toradol  [Ketorolac  Tromethamine ] Hives   Wellbutrin  [email or web address].Aland ] Other (See Comments)    Headaches   Sulfa Antibiotics Hives and Swelling   Medications:  Current Outpatient  Medications:    doxycycline  (VIBRA -TABS) 100 MG tablet, Take 1 tablet (100 mg total) by mouth 2 (two) times daily., Disp: 14 tablet, Rfl: 0   albuterol  (VENTOLIN  HFA) 108 (90 Base) MCG/ACT inhaler, Inhale 1-2 puffs into the lungs every 6 (six) hours as needed., Disp: 8 g, Rfl: 0   benzonatate  (TESSALON ) 100 MG capsule, Take 1 capsule (100 mg total) by mouth 3 (three) times daily as needed., Disp: 30 capsule, Rfl: 0   ferrous sulfate  325 (65 FE) MG EC tablet, Take 1 tablet (325 mg total) by mouth 2 (two) times daily., Disp: 60 tablet, Rfl: 2   fluticasone  (FLONASE ) 50 MCG/ACT nasal spray, Place 2 sprays into both nostrils daily., Disp: 16 g, Rfl: 0   folic acid  (V-R FOLIC ACID ) 400 MCG tablet, Take 1 tablet (400 mcg total) by mouth daily., Disp: 90 tablet, Rfl: 0   Multiple Vitamin (MULTI-VITAMINS) TABS, Take 1 tablet by mouth daily., Disp: , Rfl:    omeprazole  (PRILOSEC) 20 MG capsule, TAKE 1 CAPSULE BY MOUTH DAILY, Disp: 90 capsule, Rfl: 0   ondansetron  (ZOFRAN -ODT) 4 MG disintegrating tablet, Take 1 tablet (4 mg total) by mouth every 8 (eight) hours as needed for nausea or vomiting., Disp: 12 tablet, Rfl: 0   promethazine -dextromethorphan (PROMETHAZINE -DM) 6.25-15 MG/5ML syrup, Take 5 mLs by mouth 4 (four) times daily as needed for cough., Disp: 118 mL, Rfl: 0   QUEtiapine  (SEROQUEL  XR) 50 MG TB24 24 hr tablet, Take 1-2 tablets (50-100 mg total) by mouth at bedtime., Disp: 180 tablet, Rfl: 1   venlafaxine  XR (EFFEXOR -XR) 75 MG 24 hr capsule, TAKE ONE CAPSULE BY MOUTH DAILY FOR 7 DAYS THEN TAKE TWO CAPSULES BY MOUTH DAILY, Disp: 60 capsule, Rfl: 2  Observations/Objective: Patient is well-developed, well-nourished in no acute distress.  Resting comfortably at home.  Head is normocephalic, atraumatic.  No labored breathing.  Speech is clear and coherent with logical content.  Patient is alert and oriented at baseline.  A small, annular, erythematous base with a centrally slightly-raised macule  that has a central ulceration covered by dry eschar located on the right medial lower leg about 6 cm above the medial malleolus  Assessment and Plan: 1. Wound cellulitis (Primary) - doxycycline  (VIBRA -TABS) 100 MG tablet; Take 1 tablet (100 mg total) by mouth 2 (two) times daily.  Dispense: 14 tablet; Refill: 0  2. Tick bite of right lower leg, initial encounter  - Suspect secondary skin infection from tick bite, not a tick-borne illness at this time based on symptoms - Doxycycline  prescribed - Keep clean and dry - If symptoms worsen or develop any flu-like symptoms, fevers, or rash seek further evaluation and continuation of Doxycycline  - Seek in person evaluation if worsening  Follow Up Instructions: I discussed the assessment and treatment plan with the patient. The patient was provided an opportunity to ask questions and all were answered. The patient agreed with the plan and demonstrated an understanding of the instructions.  A copy of instructions were sent to the patient via MyChart unless otherwise noted below.    The patient was advised  to call back or seek an in-person evaluation if the symptoms worsen or if the condition fails to improve as anticipated.    Delon CHRISTELLA Dickinson, PA-C

## 2024-02-25 ENCOUNTER — Emergency Department
Admission: EM | Admit: 2024-02-25 | Discharge: 2024-02-25 | Disposition: A | Discharge status: Home / Self Care | Attending: Emergency Medicine | Admitting: Emergency Medicine

## 2024-02-25 ENCOUNTER — Other Ambulatory Visit: Payer: Self-pay

## 2024-02-25 ENCOUNTER — Encounter: Payer: Self-pay | Admitting: *Deleted

## 2024-02-25 DIAGNOSIS — S0081XA Abrasion of other part of head, initial encounter: Secondary | ICD-10-CM | POA: Insufficient documentation

## 2024-02-25 DIAGNOSIS — S90811A Abrasion, right foot, initial encounter: Secondary | ICD-10-CM | POA: Diagnosis not present

## 2024-02-25 DIAGNOSIS — S0990XA Unspecified injury of head, initial encounter: Secondary | ICD-10-CM | POA: Diagnosis present

## 2024-02-25 DIAGNOSIS — W108XXA Fall (on) (from) other stairs and steps, initial encounter: Secondary | ICD-10-CM | POA: Diagnosis not present

## 2024-02-25 DIAGNOSIS — M542 Cervicalgia: Secondary | ICD-10-CM | POA: Diagnosis not present

## 2024-02-25 DIAGNOSIS — W19XXXA Unspecified fall, initial encounter: Secondary | ICD-10-CM

## 2024-02-25 NOTE — Discharge Instructions (Addendum)
 Your exam was reassuring today.  You may have a concussion.  Symptoms of a concussion include headache, nausea, blurry vision, confusion, dizziness, sensitivity to light, sensitivity to sound, difficulty sleeping and mood swings.  The symptoms may develop over time.  They typically resolve in 7 to 10 days.  If your symptoms persist for longer than 2 weeks please be reevaluated by another healthcare provider.  Return to the emergency department if you are unable to control your headache with over-the-counter medication at home, if you have multiple episodes of vomiting or if you have any other symptoms personally concerning to you.  You will likely be more sore tomorrow than you are today. You can take 650 mg of Tylenol  and 600 mg of ibuprofen  every 6 hours as needed for pain. You can use ice, heat, muscle creams and other topical pain relievers as well.

## 2024-02-25 NOTE — ED Triage Notes (Addendum)
 Pt to triage via wheelchair.  Pt states she was looking at her phone and fell down the steps hitting a wall.  Red area to left forehead. Pt fell down approx 5 steps inside the house.  No loc.  No vomiting.  Pt has neck pain.  Pt denies back pain.  Pt alert  speech clear.  No blood thinners.

## 2024-02-25 NOTE — ED Provider Notes (Signed)
 [facility] Provider Note    Event Date/Time   First MD Initiated Contact with Patient 02/25/24 2138     (approximate)   History   Fall   HPI  Amber [name] is a 43 y.o. female with PMH of obesity, GERD, iron  deficiency anemia, anxiety, depression, OSA who presents for evaluation after a fall.  Patient states she was looking at her phone when she was walking on the stairs and lost her balance.  Patient reports she fell down approximately 5 steps inside her house and hit her head on the opposite wall.  No LOC, no vomiting.  Patient's husband reports she has been acting appropriately.  She reports some pain to the left side of her neck.      Physical Exam   Triage Vital Signs: ED Triage Vitals  Encounter Vitals Group     BP 02/25/24 2151 113/66     Girls Systolic BP Percentile --      Girls Diastolic BP Percentile --      Boys Systolic BP Percentile --      Boys Diastolic BP Percentile --      Pulse Rate 02/25/24 2151 77     Resp 02/25/24 2151 16     Temp 02/25/24 2151 98.3 F (36.8 C)     Temp Source 02/25/24 2151 Oral     SpO2 02/25/24 2151 100 %     Weight 02/25/24 2126 210 lb (95.3 kg)     Height 02/25/24 2126 5' 1 (1.549 m)     Head Circumference --      Peak Flow --      Pain Score 02/25/24 2126 8     Pain Loc --      Pain Education --      Exclude from Growth Chart --     Most recent vital signs: Vitals:   02/25/24 2151  BP: 113/66  Pulse: 77  Resp: 16  Temp: 98.3 F (36.8 C)  SpO2: 100%   General: Awake, no distress.  CV:  Good peripheral perfusion.  RRR. Resp:  Normal effort.  CTAB. Abd:  No distention.  Other:  Abrasions and mild swelling to patient's forehead, abrasion to the top of her right foot, PERRL, EOM intact, no focal neurodeficits, no ataxia, no pronator drift.  No tenderness to palpation over the bones of the spine.   ED Results / Procedures / Treatments   Labs (all labs ordered are listed, but only  abnormal results are displayed) Labs Reviewed - No data to display  PROCEDURES:  Critical Care performed: No  Procedures   MEDICATIONS ORDERED IN ED: Medications - No data to display   IMPRESSION / MDM / ASSESSMENT AND PLAN / ED COURSE  I reviewed the triage vital signs and the nursing notes.                             43 year old female presents for evaluation of a head injury after a fall.  Vital signs are stable patient NAD on exam.  Differential diagnosis includes, but is not limited to, closed head injury, abrasion, muscle strain, less likely skull fracture, intracranial bleed, neck fracture.  Patient's presentation is most consistent with acute, uncomplicated illness.  Physical exam is reassuring as there are no focal neurodeficits.  Patient did not have any tenderness over the bones of the spine so do not feel that x-ray of the neck is indicated.  Patient was tender over the paraspinal muscles to suspect this is a muscle strain.  Do not feel that CT head is indicated as patient did not have an LOC, no vomiting, does not take a blood thinner and is neurologically intact. Patient may have a concussion.  Reviewed symptoms of this.  Discussed red flag symptoms to watch for and when to return to the emergency department.  She can take Tylenol  and ibuprofen  as needed for pain.  Patient voiced understanding, all questions were answered and she was stable at discharge.      FINAL CLINICAL IMPRESSION(S) / ED DIAGNOSES   Final diagnoses:  Injury of head, initial encounter  Fall, initial encounter     Rx / DC Orders   ED Discharge Orders     None        Note:  This document was prepared using Dragon voice recognition software and may include unintentional dictation errors.   Cleaster Tinnie LABOR, PA-C 02/25/24 2312    Waymond Lorelle Cummins, MD 02/27/24 [phone]

## 2024-07-09 ENCOUNTER — Encounter: Payer: Self-pay | Admitting: Family Medicine

## 2024-07-09 ENCOUNTER — Ambulatory Visit (INDEPENDENT_AMBULATORY_CARE_PROVIDER_SITE_OTHER): Admitting: Family Medicine

## 2024-07-09 ENCOUNTER — Other Ambulatory Visit (HOSPITAL_COMMUNITY)
Admission: RE | Admit: 2024-07-09 | Discharge: 2024-07-09 | Disposition: A | Source: Ambulatory Visit | Attending: Family Medicine | Admitting: Family Medicine

## 2024-07-09 VITALS — BP 125/83 | HR 86 | Temp 97.5°F | Ht 61.0 in | Wt 201.2 lb

## 2024-07-09 DIAGNOSIS — N898 Other specified noninflammatory disorders of vagina: Secondary | ICD-10-CM

## 2024-07-09 DIAGNOSIS — Z0000 Encounter for general adult medical examination without abnormal findings: Secondary | ICD-10-CM

## 2024-07-09 DIAGNOSIS — Z1231 Encounter for screening mammogram for malignant neoplasm of breast: Secondary | ICD-10-CM

## 2024-07-09 DIAGNOSIS — F419 Anxiety disorder, unspecified: Secondary | ICD-10-CM

## 2024-07-09 DIAGNOSIS — F32A Depression, unspecified: Secondary | ICD-10-CM

## 2024-07-09 LAB — WET PREP FOR TRICH, YEAST, CLUE
Clue Cell Exam: NEGATIVE
Trichomonas Exam: NEGATIVE
Yeast Exam: NEGATIVE

## 2024-07-09 LAB — BAYER DCA HB A1C WAIVED: HB A1C (BAYER DCA - WAIVED): 4.6 % — ABNORMAL LOW (ref 4.8–5.6)

## 2024-07-09 NOTE — Assessment & Plan Note (Signed)
 Did not feel better when she was on her medication. Will check labs. Await results. Follow up 1 month to discuss further treatment.

## 2024-07-09 NOTE — Patient Instructions (Signed)
 Please call to schedule your mammogram and/or bone density: [facility] at [facility]  Address: [address], [place], [place] [address] Phone: [phone]  [facility] at [facility] [address] [place],  [place]  [address] Phone: [phone]

## 2024-07-09 NOTE — Progress Notes (Signed)
 "  BP 125/83   Pulse 86   Temp (!) 97.5 F (36.4 C) (Oral)   Ht 5' 1 (1.549 m)   Wt 201 lb 3.2 oz (91.3 kg)   SpO2 97%   BMI 38.02 kg/m    Subjective:    Patient ID: Amber [name], female    DOB: 12-Jul-1980, 44 y.o.   MRN: [id number]  HPI: Amber [name] is a 44 y.o. female presenting on 07/09/2024 for comprehensive medical examination. She has been lost to follow up since 2023. She has not seen anyone for her medical problems in that time. She has not been on any medication during that time. Current medical complaints include:  Clemens down a flight of stairs in September and went to the ER- has been fine since then.   ANXIETY/DEPRESSION- doesn't get along well with her husband Duration: chronic Status:uncontrolled Anxious mood: yes  Excessive worrying: yes Irritability: yes  Sweating: no Nausea: no Palpitations:no Hyperventilation: no Panic attacks: no Agoraphobia: no  Obscessions/compulsions: no Depressed mood: yes    07/09/2024    1:22 PM 10/02/2021    4:12 PM 08/26/2020    3:08 PM 01/11/2020   11:10 AM 12/14/2019    2:42 PM  Depression screen PHQ 2/9  Decreased Interest 2 1 1 2 2   Down, Depressed, Hopeless 2 2 1 2  0  PHQ - 2 Score 4 3 2 4 2   Altered sleeping 3 2 3 2 1   Tired, decreased energy 1 2 1 2 2   Change in appetite 1 2 0 2 1  Feeling bad or failure about yourself  2 2 1 2 2   Trouble concentrating 2 2 1 1 2   Moving slowly or fidgety/restless 0 0 0 1 0  Suicidal thoughts 1 1 0 1 0  PHQ-9 Score 14 14  8  15  10    Difficult doing work/chores Somewhat difficult  Not difficult at all Somewhat difficult Somewhat difficult     Data saved with a previous flowsheet row definition   Anhedonia: no Weight changes: no Insomnia: no   Hypersomnia: no Fatigue/loss of energy: yes Feelings of worthlessness: no Feelings of guilt: no Impaired concentration/indecisiveness: yes Suicidal ideations: no  Crying spells: no Recent Stressors/Life Changes: yes   Relationship  problems: yes   Family stress: yes     Financial stress: no    Job stress: no    Recent death/loss: no   Menopausal Symptoms: no  Depression Screen done today and results listed below:     07/09/2024    1:22 PM 10/02/2021    4:12 PM 08/26/2020    3:08 PM 01/11/2020   11:10 AM 12/14/2019    2:42 PM  Depression screen PHQ 2/9  Decreased Interest 2 1 1 2 2   Down, Depressed, Hopeless 2 2 1 2  0  PHQ - 2 Score 4 3 2 4 2   Altered sleeping 3 2 3 2 1   Tired, decreased energy 1 2 1 2 2   Change in appetite 1 2 0 2 1  Feeling bad or failure about yourself  2 2 1 2 2   Trouble concentrating 2 2 1 1 2   Moving slowly or fidgety/restless 0 0 0 1 0  Suicidal thoughts 1 1 0 1 0  PHQ-9 Score 14 14  8  15  10    Difficult doing work/chores Somewhat difficult  Not difficult at all Somewhat difficult Somewhat difficult     Data saved with a previous flowsheet row definition  Past Medical History:  Past Medical History:  Diagnosis Date   Anemia    Anxiety    Congenital malrotation of intestine (HCC)    Depression    GERD (gastroesophageal reflux disease)    OCC   Headache    H/O   IDA (iron  deficiency anemia) 10/10/2021   Obesity (BMI 30-39.9)    OSA (obstructive sleep apnea)    USE CPAP    Surgical History:  Past Surgical History:  Procedure Laterality Date   BREAST REDUCTION SURGERY  12/18/02   CESAREAN SECTION     X 2   CESAREAN SECTION WITH BILATERAL TUBAL LIGATION N/A 08/04/2015   Procedure: CESAREAN SECTION WITH BILATERAL TUBAL LIGATION;  Surgeon: Lamar SHAUNNA Lesches, MD;  Location: [facility] ORS;  Service: Obstetrics;  Laterality: N/A;   COLONOSCOPY WITH PROPOFOL  N/A 04/02/2022   Procedure: COLONOSCOPY WITH PROPOFOL ;  Surgeon: Therisa Bi, MD;  Location: [facility] ENDOSCOPY;  Service: Gastroenterology;  Laterality: N/A;   ESOPHAGOGASTRODUODENOSCOPY N/A 04/02/2022   Procedure: ESOPHAGOGASTRODUODENOSCOPY (EGD);  Surgeon: Therisa Bi, MD;  Location: [facility] ENDOSCOPY;  Service: Gastroenterology;   Laterality: N/A;   LAPAROSCOPIC CHOLECYSTECTOMY W/ CHOLANGIOGRAPHY  01/22/11   LESION REMOVAL N/A 05/04/2016   Procedure: EXCISION VAGINAL LESION;  Surgeon: Lamar SHAUNNA Lesches, MD;  Location: [facility] ORS;  Service: Gynecology;  Laterality: N/A;   NASAL SINUS SURGERY  05/26/03   SLEEVE GASTROPLASTY     TUBAL LIGATION      Medications:  No current outpatient medications on file prior to visit.   No current facility-administered medications on file prior to visit.    Allergies:  Allergies[1]  Social History:  Social History   Socioeconomic History   Marital status: Married    Spouse name: Not on file   Number of children: Not on file   Years of education: Not on file   Highest education level: Not on file  Occupational History   Not on file  Tobacco Use   Smoking status: Former    Current packs/day: 0.00    Average packs/day: 0.5 packs/day for 2.0 years (1.0 ttl pk-yrs)    Types: Cigarettes    Start date: 01/04/2001    Quit date: 01/05/2003    Years since quitting: 21.5   Smokeless tobacco: Never  Vaping Use   Vaping status: Former  Substance and Sexual Activity   Alcohol use: Yes    Alcohol/week: 4.0 standard drinks of alcohol    Types: 2 Glasses of wine, 2 Standard drinks or equivalent per week   Drug use: No   Sexual activity: Yes    Birth control/protection: Surgical  Other Topics Concern   Not on file  Social History Narrative   Married.   2 children last kid had in 2017    Works in clinical biochemist.   Enjoys spending time with family.   Social Drivers of Health   Tobacco Use: Medium Risk (07/09/2024)   Patient History    Smoking Tobacco Use: Former    Smokeless Tobacco Use: Never    Passive Exposure: Not on file  Financial Resource Strain: Low Risk (07/09/2024)   Overall Financial Resource Strain (CARDIA)    Difficulty of Paying Living Expenses: Not very hard  Food Insecurity: Food Insecurity Present (07/09/2024)   Epic    Worried About Programme Researcher, Broadcasting/film/video in  the Last Year: Sometimes true    Ran Out of Food in the Last Year: Never true  Transportation Needs: No Transportation Needs (07/09/2024)   Epic  Lack of Transportation (Medical): No    Lack of Transportation (Non-Medical): No  Physical Activity: Insufficiently Active (07/09/2024)   Exercise Vital Sign    Days of Exercise per Week: 3 days    Minutes of Exercise per Session: 30 min  Stress: Stress Concern Present (07/09/2024)   [employer] of Occupational Health - Occupational Stress Questionnaire    Feeling of Stress: Rather much  Social Connections: Moderately Integrated (07/09/2024)   Social Connection and Isolation Panel    Frequency of Communication with Friends and Family: More than three times a week    Frequency of Social Gatherings with Friends and Family: Once a week    Attends Religious Services: More than 4 times per year    Active Member of [employer] or Organizations: No    Attends Banker Meetings: Never    Marital Status: Married  Catering Manager Violence: Not At Risk (07/09/2024)   Epic    Fear of Current or Ex-Partner: No    Emotionally Abused: No    Physically Abused: No    Sexually Abused: No  Depression (PHQ2-9): High Risk (07/09/2024)   Depression (PHQ2-9)    PHQ-2 Score: 14  Alcohol Screen: Low Risk (07/09/2024)   Alcohol Screen    Last Alcohol Screening Score (AUDIT): 3  Housing: Low Risk (07/09/2024)   Epic    Unable to Pay for Housing in the Last Year: No    Number of Times Moved in the Last Year: 0    Homeless in the Last Year: No  Utilities: Not At Risk (07/09/2024)   Epic    Threatened with loss of utilities: No  Health Literacy: Adequate Health Literacy (07/09/2024)   B1300 Health Literacy    Frequency of need for help with medical instructions: Never   Tobacco Use History[2] Social History   Substance and Sexual Activity  Alcohol Use Yes   Alcohol/week: 4.0 standard drinks of alcohol   Types: 2 Glasses of wine, 2 Standard drinks  or equivalent per week    Family History:  Family History  Problem Relation Age of Onset   Hypertension Mother    Diabetes Mother    Hypertension Brother    Leukemia Maternal Grandmother 40   Diabetes Maternal Grandfather    COPD Maternal Grandfather    Heart disease Maternal Grandfather     Past medical history, surgical history, medications, allergies, family history and social history reviewed with patient today and changes made to appropriate areas of the chart.   Review of Systems  Constitutional:  Positive for diaphoresis. Negative for chills, fever, malaise/fatigue and weight loss.  HENT:  Positive for ear pain. Negative for congestion, ear discharge, hearing loss, nosebleeds, sinus pain, sore throat and tinnitus.        + dental pain  Eyes: Negative.   Respiratory: Negative.  Negative for stridor.   Cardiovascular:  Positive for leg swelling. Negative for chest pain, palpitations, orthopnea, claudication and PND.  Gastrointestinal:  Positive for heartburn. Negative for abdominal pain, blood in stool, constipation, diarrhea, melena, nausea and vomiting.  Genitourinary: Negative.   Musculoskeletal:  Positive for myalgias (L leg will ache). Negative for back pain, falls, joint pain and neck pain.  Skin: Negative.   Neurological:  Positive for dizziness. Negative for tingling, tremors, sensory change, speech change, focal weakness, seizures, loss of consciousness, weakness and headaches.  Endo/Heme/Allergies: Negative.   Psychiatric/Behavioral:  Positive for depression. Negative for hallucinations, memory loss, substance abuse and suicidal ideas. The patient is nervous/anxious.  The patient does not have insomnia.    All other ROS negative except what is listed above and in the HPI.      Objective:    BP 125/83   Pulse 86   Temp (!) 97.5 F (36.4 C) (Oral)   Ht 5' 1 (1.549 m)   Wt 201 lb 3.2 oz (91.3 kg)   SpO2 97%   BMI 38.02 kg/m   Wt Readings from Last 3  Encounters:  07/09/24 201 lb 3.2 oz (91.3 kg)  02/25/24 210 lb (95.3 kg)  08/23/23 227 lb 15.3 oz (103.4 kg)    Physical Exam Vitals and nursing note reviewed. Exam conducted with a chaperone present.  Constitutional:      General: She is not in acute distress.    Appearance: Normal appearance. She is obese. She is not ill-appearing, toxic-appearing or diaphoretic.  HENT:     Head: Normocephalic and atraumatic.     Right Ear: Tympanic membrane, ear canal and external ear normal. There is no impacted cerumen.     Left Ear: Tympanic membrane, ear canal and external ear normal. There is no impacted cerumen.     Nose: Nose normal. No congestion or rhinorrhea.     Mouth/Throat:     Mouth: Mucous membranes are moist.     Pharynx: Oropharynx is clear. No oropharyngeal exudate or posterior oropharyngeal erythema.  Eyes:     General: No scleral icterus.       Right eye: No discharge.        Left eye: No discharge.     Extraocular Movements: Extraocular movements intact.     Conjunctiva/sclera: Conjunctivae normal.     Pupils: Pupils are equal, round, and reactive to light.  Neck:     Vascular: No carotid bruit.  Cardiovascular:     Rate and Rhythm: Normal rate and regular rhythm.     Pulses: Normal pulses.     Heart sounds: No murmur heard.    No friction rub. No gallop.  Pulmonary:     Effort: Pulmonary effort is normal. No respiratory distress.     Breath sounds: Normal breath sounds. No stridor. No wheezing, rhonchi or rales.  Chest:     Chest wall: No tenderness.  Abdominal:     General: Abdomen is flat. Bowel sounds are normal. There is no distension.     Palpations: Abdomen is soft. There is no mass.     Tenderness: There is abdominal tenderness (LLQ). There is no right CVA tenderness, left CVA tenderness, guarding or rebound.     Hernia: No hernia is present.  Genitourinary:    Labia:        Right: No rash, tenderness, lesion or injury.        Left: No rash, tenderness,  lesion or injury.      Vagina: No signs of injury and foreign body. Vaginal discharge present. No erythema, tenderness, bleeding, lesions or prolapsed vaginal walls.     Cervix: Normal.     Uterus: Normal.      Adnexa: Right adnexa normal and left adnexa normal.     Comments: Breast exams deferred with shared decision making Musculoskeletal:        General: No swelling, tenderness, deformity or signs of injury.     Cervical back: Normal range of motion and neck supple. No rigidity. No muscular tenderness.     Right lower leg: No edema.     Left lower leg: No edema.  Lymphadenopathy:  Cervical: No cervical adenopathy.  Skin:    General: Skin is warm and dry.     Capillary Refill: Capillary refill takes less than 2 seconds.     Coloration: Skin is not jaundiced or pale.     Findings: No bruising, erythema, lesion or rash.  Neurological:     General: No focal deficit present.     Mental Status: She is alert and oriented to person, place, and time. Mental status is at baseline.     Cranial Nerves: No cranial nerve deficit.     Sensory: No sensory deficit.     Motor: No weakness.     Coordination: Coordination normal.     Gait: Gait normal.     Deep Tendon Reflexes: Reflexes normal.  Psychiatric:        Mood and Affect: Mood normal.        Behavior: Behavior normal.        Thought Content: Thought content normal.        Judgment: Judgment normal.     Results for orders placed or performed during the hospital encounter of 08/23/23  Lipase, blood   Collection Time: 08/23/23  9:55 AM  Result Value Ref Range   Lipase 29 11 - 51 U/L  Comprehensive metabolic panel   Collection Time: 08/23/23  9:55 AM  Result Value Ref Range   Sodium 138 135 - 145 mmol/L   Potassium 3.1 (L) 3.5 - 5.1 mmol/L   Chloride 106 98 - 111 mmol/L   CO2 21 (L) 22 - 32 mmol/L   Glucose, Bld 110 (H) 70 - 99 mg/dL   BUN 17 6 - 20 mg/dL   Creatinine, Ser 9.13 0.44 - 1.00 mg/dL   Calcium 8.4 (L) 8.9 - 10.3  mg/dL   Total Protein 7.2 6.5 - 8.1 g/dL   Albumin 3.9 3.5 - 5.0 g/dL   AST 51 (H) 15 - 41 U/L   ALT 52 (H) 0 - 44 U/L   Alkaline Phosphatase 40 38 - 126 U/L   Total Bilirubin 1.3 (H) 0.0 - 1.2 mg/dL   GFR, Estimated >39 >39 mL/min   Anion gap 11 5 - 15  CBC   Collection Time: 08/23/23  9:55 AM  Result Value Ref Range   WBC 3.7 (L) 4.0 - 10.5 K/uL   RBC 4.57 3.87 - 5.11 MIL/uL   Hemoglobin 9.1 (L) 12.0 - 15.0 g/dL   HCT 66.9 (L) 63.9 - 53.9 %   MCV 72.2 (L) 80.0 - 100.0 fL   MCH 19.9 (L) 26.0 - 34.0 pg   MCHC 27.6 (L) 30.0 - 36.0 g/dL   RDW 79.3 (H) 88.4 - 84.4 %   Platelets 208 150 - 400 K/uL   nRBC 0.0 0.0 - 0.2 %  Magnesium    Collection Time: 08/23/23  9:55 AM  Result Value Ref Range   Magnesium  1.7 1.7 - 2.4 mg/dL  hCG, quantitative, pregnancy   Collection Time: 08/23/23  9:55 AM  Result Value Ref Range   hCG, Beta Chain, Quant, S <1 <5 mIU/mL  Resp panel by RT-PCR (RSV, Flu A&B, Covid) Anterior Nasal Swab   Collection Time: 08/23/23 11:06 AM   Specimen: Anterior Nasal Swab  Result Value Ref Range   SARS Coronavirus 2 by RT PCR NEGATIVE NEGATIVE   Influenza A by PCR NEGATIVE NEGATIVE   Influenza B by PCR NEGATIVE NEGATIVE   Resp Syncytial Virus by PCR NEGATIVE NEGATIVE      Assessment & Plan:  Problem List Items Addressed This Visit       Other   Anxiety and depression   Did not feel better when she was on her medication. Will check labs. Await results. Follow up 1 month to discuss further treatment.       Other Visit Diagnoses       Routine general medical examination at a health care facility    -  Primary   Vaccines up to date/declined. Screening labs checked today. Pap done. Mammogram ordered. Colonoscopy up to date. Continue diet and exercise. Call with concerns.   Relevant Orders   CBC with Differential/Platelet   Comprehensive metabolic panel with GFR   Lipid Panel w/o Chol/HDL Ratio   TSH   Hepatitis B surface antibody,quantitative   Cytology  - PAP   Bayer DCA Hb A1c Waived     Encounter for screening mammogram for malignant neoplasm of breast       Mammogram ordered today.   Relevant Orders   MM 3D SCREENING MAMMOGRAM BILATERAL BREAST     Vaginal discharge       Will send for wet prep. Call with any concerns.   Relevant Orders   WET PREP FOR TRICH, YEAST, CLUE        Follow up plan: Return in about 4 weeks (around 08/06/2024).   LABORATORY TESTING:  - Pap smear: pap done  IMMUNIZATIONS:   - Tdap: Tetanus vaccination status reviewed: last tetanus booster within 10 years. - Influenza: Refused - Pneumovax: Not applicable - Prevnar: Not applicable - COVID: Refused - HPV: Up to date - Shingrix vaccine: Not applicable  SCREENING: -Mammogram: Ordered today  - Colonoscopy: Up to date   PATIENT COUNSELING:   Advised to take 1 mg of folate supplement per day if capable of pregnancy.   Sexuality: Discussed sexually transmitted diseases, partner selection, use of condoms, avoidance of unintended pregnancy  and contraceptive alternatives.   Advised to avoid cigarette smoking.  I discussed with the patient that most people either abstain from alcohol or drink within safe limits (<=14/week and <=4 drinks/occasion for males, <=7/weeks and <= 3 drinks/occasion for females) and that the risk for alcohol disorders and other health effects rises proportionally with the number of drinks per week and how often a drinker exceeds daily limits.  Discussed cessation/primary prevention of drug use and availability of treatment for abuse.   Diet: Encouraged to adjust caloric intake to maintain  or achieve ideal body weight, to reduce intake of dietary saturated fat and total fat, to limit sodium intake by avoiding high sodium foods and not adding table salt, and to maintain adequate dietary potassium and calcium preferably from fresh fruits, vegetables, and low-fat dairy products.    stressed the importance of regular  exercise  Injury prevention: Discussed safety belts, safety helmets, smoke detector, smoking near bedding or upholstery.   Dental health: Discussed importance of regular tooth brushing, flossing, and dental visits.    NEXT PREVENTATIVE PHYSICAL DUE IN 1 YEAR. Return in about 4 weeks (around 08/06/2024).              [1]  Allergies Allergen Reactions   Penicillins Hives and Swelling    Has patient had a PCN reaction causing immediate rash, facial/tongue/throat swelling, SOB or lightheadedness with hypotension:No  Has patient had a PCN reaction causing severe rash involving mucus membranes or skin necrosis:Yes Has patient had a PCN reaction that required hospitalization:No Has patient had a PCN reaction occurring within the last 10 years:No  If all of the above answers are NO, then may proceed with Cephalosporin use.    Toradol  [Ketorolac  Tromethamine ] Hives   Wellbutrin  [Bupropion ] Other (See Comments)    Headaches   Sulfa Antibiotics Hives and Swelling  [2]  Social History Tobacco Use  Smoking Status Former   Current packs/day: 0.00   Average packs/day: 0.5 packs/day for 2.0 years (1.0 ttl pk-yrs)   Types: Cigarettes   Start date: 01/04/2001   Quit date: 01/05/2003   Years since quitting: 21.5  Smokeless Tobacco Never   "

## 2024-07-10 LAB — CBC WITH DIFFERENTIAL/PLATELET
Basophils Absolute: 0 x10E3/uL (ref 0.0–0.2)
Basos: 1 %
EOS (ABSOLUTE): 0.1 x10E3/uL (ref 0.0–0.4)
Eos: 2 %
Hematocrit: 31.5 % — ABNORMAL LOW (ref 34.0–46.6)
Hemoglobin: 8.7 g/dL — ABNORMAL LOW (ref 11.1–15.9)
Immature Grans (Abs): 0 x10E3/uL (ref 0.0–0.1)
Immature Granulocytes: 0 %
Lymphocytes Absolute: 1.9 x10E3/uL (ref 0.7–3.1)
Lymphs: 29 %
MCH: 19.4 pg — ABNORMAL LOW (ref 26.6–33.0)
MCHC: 27.6 g/dL — ABNORMAL LOW (ref 31.5–35.7)
MCV: 70 fL — ABNORMAL LOW (ref 79–97)
Monocytes Absolute: 0.5 x10E3/uL (ref 0.1–0.9)
Monocytes: 7 %
Neutrophils Absolute: 4.2 x10E3/uL (ref 1.4–7.0)
Neutrophils: 61 %
Platelets: 317 x10E3/uL (ref 150–450)
RBC: 4.48 x10E6/uL (ref 3.77–5.28)
RDW: 18.4 % — ABNORMAL HIGH (ref 11.7–15.4)
WBC: 6.7 x10E3/uL (ref 3.4–10.8)

## 2024-07-10 LAB — LIPID PANEL W/O CHOL/HDL RATIO
Cholesterol, Total: 123 mg/dL (ref 100–199)
HDL: 42 mg/dL
LDL Chol Calc (NIH): 56 mg/dL (ref 0–99)
Triglycerides: 145 mg/dL (ref 0–149)
VLDL Cholesterol Cal: 25 mg/dL (ref 5–40)

## 2024-07-10 LAB — COMPREHENSIVE METABOLIC PANEL WITH GFR
ALT: 17 IU/L (ref 0–32)
AST: 15 IU/L (ref 0–40)
Albumin: 4.3 g/dL (ref 3.9–4.9)
Alkaline Phosphatase: 46 IU/L (ref 41–116)
BUN/Creatinine Ratio: 11 (ref 9–23)
BUN: 9 mg/dL (ref 6–24)
Bilirubin Total: 0.8 mg/dL (ref 0.0–1.2)
CO2: 20 mmol/L (ref 20–29)
Calcium: 9.1 mg/dL (ref 8.7–10.2)
Chloride: 103 mmol/L (ref 96–106)
Creatinine, Ser: 0.79 mg/dL (ref 0.57–1.00)
Globulin, Total: 2.4 g/dL (ref 1.5–4.5)
Glucose: 75 mg/dL (ref 70–99)
Potassium: 4.1 mmol/L (ref 3.5–5.2)
Sodium: 139 mmol/L (ref 134–144)
Total Protein: 6.7 g/dL (ref 6.0–8.5)
eGFR: 94 mL/min/1.73

## 2024-07-10 LAB — TSH: TSH: 1.17 u[IU]/mL (ref 0.450–4.500)

## 2024-07-10 LAB — HEPATITIS B SURFACE ANTIBODY, QUANTITATIVE: Hepatitis B Surf Ab Quant: 403 m[IU]/mL

## 2024-07-12 ENCOUNTER — Ambulatory Visit: Payer: Self-pay | Admitting: Family Medicine

## 2024-07-12 MED ORDER — IRON (FERROUS SULFATE) 325 (65 FE) MG PO TABS: 325.0000 mg | ORAL_TABLET | Freq: Two times a day (BID) | ORAL | 3 refills | Status: AC

## 2024-07-14 LAB — CYTOLOGY - PAP
Adequacy: ABSENT
Comment: NEGATIVE
Diagnosis: NEGATIVE
High risk HPV: NEGATIVE

## 2024-08-14 ENCOUNTER — Ambulatory Visit: Admitting: Family Medicine
